# Patient Record
Sex: Female | Born: 1940 | Race: White | Hispanic: No | Marital: Married | Smoking: Never smoker
Health system: Southern US, Community
[De-identification: ages and names within clinical notes are randomized; demographics above are authoritative.]

## PROBLEM LIST (undated history)

## (undated) DIAGNOSIS — Z789 Other specified health status: Secondary | ICD-10-CM

## (undated) HISTORY — PX: EYE SURGERY: SHX253

## (undated) HISTORY — PX: TUBAL LIGATION: SHX77

---

## 1971-06-16 DIAGNOSIS — N8110 Cystocele, unspecified: Secondary | ICD-10-CM

## 1971-06-16 HISTORY — DX: Cystocele, unspecified: N81.10

## 2007-01-12 ENCOUNTER — Emergency Department (HOSPITAL_COMMUNITY): Admission: EM | Admit: 2007-01-12 | Discharge: 2007-01-13 | Payer: Self-pay | Admitting: Emergency Medicine

## 2015-07-30 ENCOUNTER — Ambulatory Visit
Admission: RE | Admit: 2015-07-30 | Discharge: 2015-07-30 | Disposition: A | Discharge status: Home / Self Care | Payer: Medicare Other | Source: Ambulatory Visit | Attending: Orthopedic Surgery | Admitting: Orthopedic Surgery

## 2015-07-30 ENCOUNTER — Other Ambulatory Visit: Payer: Self-pay | Admitting: Orthopedic Surgery

## 2015-07-30 ENCOUNTER — Other Ambulatory Visit (HOSPITAL_COMMUNITY): Payer: Self-pay

## 2015-07-30 DIAGNOSIS — S42402A Unspecified fracture of lower end of left humerus, initial encounter for closed fracture: Secondary | ICD-10-CM

## 2015-08-01 ENCOUNTER — Encounter (HOSPITAL_COMMUNITY): Payer: Self-pay

## 2015-08-01 ENCOUNTER — Encounter (HOSPITAL_COMMUNITY)
Admission: RE | Admit: 2015-08-01 | Discharge: 2015-08-01 | Disposition: A | Discharge status: Home / Self Care | Payer: Medicare Other | Source: Ambulatory Visit | Attending: Orthopedic Surgery | Admitting: Orthopedic Surgery

## 2015-08-01 HISTORY — DX: Other specified health status: Z78.9

## 2015-08-01 LAB — CBC WITH DIFFERENTIAL/PLATELET
Basophils Absolute: 0 10*3/uL (ref 0.0–0.1)
Basophils Relative: 0 %
Eosinophils Absolute: 0.1 10*3/uL (ref 0.0–0.7)
Eosinophils Relative: 1 %
HCT: 34 % — ABNORMAL LOW (ref 36.0–46.0)
Hemoglobin: 10.8 g/dL — ABNORMAL LOW (ref 12.0–15.0)
Lymphocytes Relative: 29 %
Lymphs Abs: 2.4 10*3/uL (ref 0.7–4.0)
MCH: 29.5 pg (ref 26.0–34.0)
MCHC: 31.8 g/dL (ref 30.0–36.0)
MCV: 92.9 fL (ref 78.0–100.0)
Monocytes Absolute: 0.8 10*3/uL (ref 0.1–1.0)
Monocytes Relative: 9 %
Neutro Abs: 5.2 10*3/uL (ref 1.7–7.7)
Neutrophils Relative %: 61 %
Platelets: 255 10*3/uL (ref 150–400)
RBC: 3.66 MIL/uL — ABNORMAL LOW (ref 3.87–5.11)
RDW: 13.1 % (ref 11.5–15.5)
WBC: 8.5 10*3/uL (ref 4.0–10.5)

## 2015-08-01 LAB — ABO/RH: ABO/RH(D): O POS

## 2015-08-01 LAB — COMPREHENSIVE METABOLIC PANEL
ALT: 13 U/L — ABNORMAL LOW (ref 14–54)
AST: 26 U/L (ref 15–41)
Albumin: 3.4 g/dL — ABNORMAL LOW (ref 3.5–5.0)
Alkaline Phosphatase: 91 U/L (ref 38–126)
Anion gap: 12 (ref 5–15)
BUN: 6 mg/dL (ref 6–20)
CO2: 28 mmol/L (ref 22–32)
Calcium: 9.6 mg/dL (ref 8.9–10.3)
Chloride: 100 mmol/L — ABNORMAL LOW (ref 101–111)
Creatinine, Ser: 0.94 mg/dL (ref 0.44–1.00)
GFR calc Af Amer: >60 mL/min (ref >60)
GFR calc non Af Amer: 58 mL/min — ABNORMAL LOW (ref >60)
Glucose, Bld: 90 mg/dL (ref 65–99)
Potassium: 3.3 mmol/L — ABNORMAL LOW (ref 3.5–5.1)
Sodium: 140 mmol/L (ref 135–145)
Total Bilirubin: 0.6 mg/dL (ref 0.3–1.2)
Total Protein: 7 g/dL (ref 6.5–8.1)

## 2015-08-01 LAB — URINALYSIS, ROUTINE W REFLEX MICROSCOPIC
Bilirubin Urine: NEGATIVE
Glucose, UA: NEGATIVE mg/dL
Ketones, ur: NEGATIVE mg/dL
Nitrite: NEGATIVE
Protein, ur: NEGATIVE mg/dL
Specific Gravity, Urine: 1.007 (ref 1.005–1.030)
pH: 5 (ref 5.0–8.0)

## 2015-08-01 LAB — TYPE AND SCREEN
ABO/RH(D): O POS
Antibody Screen: NEGATIVE

## 2015-08-01 LAB — URINE MICROSCOPIC-ADD ON

## 2015-08-01 LAB — APTT: aPTT: 31 seconds (ref 24–37)

## 2015-08-01 LAB — PROTIME-INR
INR: 1.05 (ref 0.00–1.49)
Prothrombin Time: 13.9 seconds (ref 11.6–15.2)

## 2015-08-01 MED ORDER — LACTATED RINGERS IV SOLN
INTRAVENOUS | Status: DC
  Administered 2015-08-02 (×3): via INTRAVENOUS

## 2015-08-01 MED ORDER — CEFAZOLIN SODIUM-DEXTROSE 2-3 GM-% IV SOLR
2.0000 g | INTRAVENOUS | Status: AC
  Administered 2015-08-02: 2 g via INTRAVENOUS
  Filled 2015-08-01: qty 50

## 2015-08-01 MED ORDER — CHLORHEXIDINE GLUCONATE 4 % EX LIQD: 60.0000 mL | Freq: Once | CUTANEOUS | Status: DC

## 2015-08-01 NOTE — Pre-Procedure Instructions (Signed)
Erica Mora  08/01/2015      [facility] - [place], [place] - [id number] [place] [address] Phone: [phone] Fax: [phone]    Your procedure is scheduled on 08/02/2015  Report to [facility] Admitting at 6:00 A.M.  Call this number if you have problems the morning of surgery:  [phone]   Remember:  Do not eat food or drink liquids after midnight.  On Thursday   Take these medicines the morning of surgery with A SIP OF WATER : Pain medicine is OK the morning ..........  No more advil   Do not wear jewelry, make-up or nail polish.   Do not wear lotions, powders, or perfumes.  You may wear deodorant.   Do not shave 48 hours prior to surgery.    Do not bring valuables to the hospital.  [facility] is not responsible for any belongings or valuables.   Contacts, dentures or bridgework may not be worn into surgery.  Leave your suitcase in the car.  After surgery it may be brought to your room.  For patients admitted to the hospital, discharge time will be determined by your treatment team.  Patients discharged the day of surgery will not be allowed to drive home.   Name and phone number of your driver:   With spouse  Special instructions:  Special Instructions: [facility] - Preparing for Surgery  Before surgery, you can play an important role.  Because skin is not sterile, your skin needs to be as free of germs as possible.  You can reduce the number of germs on you skin by washing with CHG (chlorahexidine gluconate) soap before surgery.  CHG is an antiseptic cleaner which kills germs and bonds with the skin to continue killing germs even after washing.  Please DO NOT use if you have an allergy to CHG or antibacterial soaps.  If your skin becomes reddened/irritated stop using the CHG and inform your nurse when you arrive at Short Stay.  Do not shave (including legs and underarms) for at least 48 hours prior to  the first CHG shower.  You may shave your face.  Please follow these instructions carefully:   1.  Shower with CHG Soap the night before surgery and the  morning of Surgery.  2.  If you choose to wash your hair, wash your hair first as usual with your  normal shampoo.  3.  After you shampoo, rinse your hair and body thoroughly to remove the  Shampoo.  4.  Use CHG as you would any other liquid soap.  You can apply chg directly to the skin and wash gently with scrungie or a clean washcloth.  5.  Apply the CHG Soap to your body ONLY FROM THE NECK DOWN.    Do not use on open wounds or open sores.  Avoid contact with your eyes, ears, mouth and genitals (private parts).  Wash genitals (private parts)   with your normal soap.  6.  Wash thoroughly, paying special attention to the area where your surgery will be performed.  7.  Thoroughly rinse your body with warm water from the neck down.  8.  DO NOT shower/wash with your normal soap after using and rinsing off   the CHG Soap.  9.  Pat yourself dry with a clean towel.            10.  Wear clean pajamas.  11.  Place clean sheets on your bed the night of your first shower and do not sleep with pets.  Day of Surgery  Do not apply any lotions/deodorants the morning of surgery.  Please wear clean clothes to the hospital/surgery center.  Please read over the following fact sheets that you were given. Pain Booklet, Coughing and Deep Breathing and Surgical Site Infection Prevention

## 2015-08-01 NOTE — Progress Notes (Addendum)
Pt. Reports " I don't see doctors & I don't take medicines".  Pt. Also remarked that she will not have a tube put down her throat.  Pt. Reports that she saw Dr. Susann Givens for PCP- - last time 5 yrs. Ago. Pt. Denies ever having an EKG, CXR, or any advanced cardiac surveillance. Pt. Denies chest concerns.

## 2015-08-01 NOTE — Progress Notes (Signed)
Call to A. Kabbe,NP, reported lack of med. Care & that she takes very little medicine. Lab results noted & reported as well. Pt.'s chart will be further reviewed.

## 2015-08-01 NOTE — Progress Notes (Signed)
Call to Dr. Carlean Jews office, spoke with Rebekah Chesterfield, she will pass msg. On to Keith,PA to put preop orders into EPIC chart.

## 2015-08-02 ENCOUNTER — Encounter (HOSPITAL_COMMUNITY): Payer: Self-pay | Admitting: *Deleted

## 2015-08-02 ENCOUNTER — Inpatient Hospital Stay (HOSPITAL_COMMUNITY): Payer: Medicare Other

## 2015-08-02 ENCOUNTER — Inpatient Hospital Stay (HOSPITAL_COMMUNITY): Payer: Medicare Other | Admitting: Anesthesiology

## 2015-08-02 ENCOUNTER — Inpatient Hospital Stay (HOSPITAL_COMMUNITY): Payer: Medicare Other | Admitting: Emergency Medicine

## 2015-08-02 ENCOUNTER — Inpatient Hospital Stay (HOSPITAL_COMMUNITY)
Admission: RE | Admit: 2015-08-02 | Discharge: 2015-08-02 | DRG: 493 | Disposition: A | Discharge status: Home / Self Care | Payer: Medicare Other | Source: Ambulatory Visit | Attending: Orthopedic Surgery | Admitting: Orthopedic Surgery

## 2015-08-02 ENCOUNTER — Encounter (HOSPITAL_COMMUNITY)
Admission: RE | Disposition: A | Discharge status: Home / Self Care | Payer: Self-pay | Source: Ambulatory Visit | Attending: Orthopedic Surgery

## 2015-08-02 DIAGNOSIS — W010XXA Fall on same level from slipping, tripping and stumbling without subsequent striking against object, initial encounter: Secondary | ICD-10-CM | POA: Diagnosis present

## 2015-08-02 DIAGNOSIS — S42409A Unspecified fracture of lower end of unspecified humerus, initial encounter for closed fracture: Secondary | ICD-10-CM | POA: Diagnosis present

## 2015-08-02 DIAGNOSIS — S42412A Displaced simple supracondylar fracture without intercondylar fracture of left humerus, initial encounter for closed fracture: Principal | ICD-10-CM | POA: Diagnosis present

## 2015-08-02 DIAGNOSIS — M25532 Pain in left wrist: Secondary | ICD-10-CM | POA: Diagnosis present

## 2015-08-02 DIAGNOSIS — S52592A Other fractures of lower end of left radius, initial encounter for closed fracture: Secondary | ICD-10-CM | POA: Diagnosis present

## 2015-08-02 DIAGNOSIS — Y9289 Other specified places as the place of occurrence of the external cause: Secondary | ICD-10-CM

## 2015-08-02 DIAGNOSIS — Y9341 Activity, dancing: Secondary | ICD-10-CM | POA: Diagnosis not present

## 2015-08-02 DIAGNOSIS — Z419 Encounter for procedure for purposes other than remedying health state, unspecified: Secondary | ICD-10-CM

## 2015-08-02 DIAGNOSIS — S52502A Unspecified fracture of the lower end of left radius, initial encounter for closed fracture: Secondary | ICD-10-CM

## 2015-08-02 DIAGNOSIS — S42402A Unspecified fracture of lower end of left humerus, initial encounter for closed fracture: Secondary | ICD-10-CM

## 2015-08-02 HISTORY — PX: ORIF HUMERUS FRACTURE: SHX2126

## 2015-08-02 SURGERY — OPEN REDUCTION INTERNAL FIXATION (ORIF) DISTAL HUMERUS FRACTURE
Anesthesia: Regional | Laterality: Left

## 2015-08-02 MED ORDER — EPHEDRINE SULFATE 50 MG/ML IJ SOLN
INTRAMUSCULAR | Status: AC
  Filled 2015-08-02: qty 1

## 2015-08-02 MED ORDER — DEXTROSE 5 % IV SOLN: 500.0000 mg | Freq: Four times a day (QID) | INTRAVENOUS | Status: DC

## 2015-08-02 MED ORDER — FENTANYL CITRATE (PF) 250 MCG/5ML IJ SOLN
INTRAMUSCULAR | Status: AC
  Filled 2015-08-02: qty 5

## 2015-08-02 MED ORDER — LIDOCAINE HCL 2 % EX GEL
CUTANEOUS | Status: AC
  Filled 2015-08-02: qty 20

## 2015-08-02 MED ORDER — ROCURONIUM BROMIDE 50 MG/5ML IV SOLN
INTRAVENOUS | Status: AC
  Filled 2015-08-02: qty 1

## 2015-08-02 MED ORDER — ONDANSETRON HCL 4 MG/2ML IJ SOLN
INTRAMUSCULAR | Status: AC
  Filled 2015-08-02: qty 2

## 2015-08-02 MED ORDER — HYDROCODONE-ACETAMINOPHEN 5-325 MG PO TABS: 1.0000 | ORAL_TABLET | Freq: Four times a day (QID) | ORAL | Status: DC | PRN

## 2015-08-02 MED ORDER — MIDAZOLAM HCL 2 MG/2ML IJ SOLN
INTRAMUSCULAR | Status: AC
  Filled 2015-08-02: qty 2

## 2015-08-02 MED ORDER — BUPIVACAINE-EPINEPHRINE (PF) 0.5% -1:200000 IJ SOLN
INTRAMUSCULAR | Status: DC | PRN
  Administered 2015-08-02: 25 mL via PERINEURAL

## 2015-08-02 MED ORDER — SUCCINYLCHOLINE CHLORIDE 20 MG/ML IJ SOLN
INTRAMUSCULAR | Status: AC
  Filled 2015-08-02: qty 1

## 2015-08-02 MED ORDER — ONDANSETRON HCL 4 MG/2ML IJ SOLN
INTRAMUSCULAR | Status: DC | PRN
  Administered 2015-08-02: 4 mg via INTRAVENOUS

## 2015-08-02 MED ORDER — DEXAMETHASONE SODIUM PHOSPHATE 4 MG/ML IJ SOLN
INTRAMUSCULAR | Status: AC
  Filled 2015-08-02: qty 1

## 2015-08-02 MED ORDER — LACTATED RINGERS IV SOLN: INTRAVENOUS | Status: DC

## 2015-08-02 MED ORDER — OXYCODONE HCL 5 MG PO TABS: 5.0000 mg | ORAL_TABLET | ORAL | Status: DC | PRN

## 2015-08-02 MED ORDER — LIDOCAINE HCL (CARDIAC) 20 MG/ML IV SOLN
INTRAVENOUS | Status: DC | PRN
  Administered 2015-08-02: 40 mg via INTRAVENOUS

## 2015-08-02 MED ORDER — PHENYLEPHRINE 40 MCG/ML (10ML) SYRINGE FOR IV PUSH (FOR BLOOD PRESSURE SUPPORT)
PREFILLED_SYRINGE | INTRAVENOUS | Status: AC
  Filled 2015-08-02: qty 10

## 2015-08-02 MED ORDER — MIDAZOLAM HCL 5 MG/5ML IJ SOLN
INTRAMUSCULAR | Status: DC | PRN
  Administered 2015-08-02: 2 mg via INTRAVENOUS

## 2015-08-02 MED ORDER — METHOCARBAMOL 500 MG PO TABS: 1000.0000 mg | ORAL_TABLET | Freq: Four times a day (QID) | ORAL | Status: DC

## 2015-08-02 MED ORDER — VECURONIUM BROMIDE 10 MG IV SOLR
INTRAVENOUS | Status: DC | PRN
  Administered 2015-08-02 (×2): 2 mg via INTRAVENOUS
  Administered 2015-08-02: 6 mg via INTRAVENOUS

## 2015-08-02 MED ORDER — FENTANYL CITRATE (PF) 100 MCG/2ML IJ SOLN
INTRAMUSCULAR | Status: DC | PRN
  Administered 2015-08-02 (×2): 50 ug via INTRAVENOUS

## 2015-08-02 MED ORDER — LIDOCAINE HCL (CARDIAC) 20 MG/ML IV SOLN
INTRAVENOUS | Status: AC
  Filled 2015-08-02: qty 5

## 2015-08-02 MED ORDER — GLYCOPYRROLATE 0.2 MG/ML IJ SOLN
INTRAMUSCULAR | Status: AC
  Filled 2015-08-02: qty 1

## 2015-08-02 MED ORDER — SODIUM CHLORIDE 0.9 % IJ SOLN
INTRAMUSCULAR | Status: AC
  Filled 2015-08-02: qty 10

## 2015-08-02 MED ORDER — BUPIVACAINE HCL (PF) 0.25 % IJ SOLN
INTRAMUSCULAR | Status: AC
  Filled 2015-08-02: qty 30

## 2015-08-02 MED ORDER — PROPOFOL 10 MG/ML IV BOLUS
INTRAVENOUS | Status: DC | PRN
  Administered 2015-08-02: 100 mg via INTRAVENOUS

## 2015-08-02 MED ORDER — PHENYLEPHRINE HCL 10 MG/ML IJ SOLN
INTRAMUSCULAR | Status: DC | PRN
  Administered 2015-08-02 (×3): 40 ug via INTRAVENOUS

## 2015-08-02 MED ORDER — DEXAMETHASONE SODIUM PHOSPHATE 4 MG/ML IJ SOLN
INTRAMUSCULAR | Status: DC | PRN
  Administered 2015-08-02: 4 mg via INTRAVENOUS
  Administered 2015-08-02: 2 mg via INTRAVENOUS

## 2015-08-02 MED ORDER — PROPOFOL 10 MG/ML IV BOLUS
INTRAVENOUS | Status: AC
  Filled 2015-08-02: qty 20

## 2015-08-02 MED ORDER — 0.9 % SODIUM CHLORIDE (POUR BTL) OPTIME
TOPICAL | Status: DC | PRN
  Administered 2015-08-02: 1000 mL

## 2015-08-02 MED ORDER — SUGAMMADEX SODIUM 200 MG/2ML IV SOLN
INTRAVENOUS | Status: DC | PRN
  Administered 2015-08-02: 100 mg via INTRAVENOUS

## 2015-08-02 SURGICAL SUPPLY — 104 items
BANDAGE ACE 4X5 VEL STRL LF (GAUZE/BANDAGES/DRESSINGS) ×4 IMPLANT
BENZOIN TINCTURE PRP APPL 2/3 (GAUZE/BANDAGES/DRESSINGS) ×4 IMPLANT
BIT DRILL 2.2 SS TIBIAL (BIT) ×2 IMPLANT
BIT DRILL 2.3 QUICK RELEASE (BIT) ×1 IMPLANT
BIT DRILL 2.8 QUICK RELEASE (BIT) ×1 IMPLANT
BLADE AVERAGE 25X9 (BLADE) IMPLANT
BNDG ESMARK 4X9 LF (GAUZE/BANDAGES/DRESSINGS) ×2 IMPLANT
BNDG GAUZE ELAST 4 BULKY (GAUZE/BANDAGES/DRESSINGS) ×4 IMPLANT
BRUSH SCRUB DISP (MISCELLANEOUS) ×4 IMPLANT
CORDS BIPOLAR (ELECTRODE) ×4 IMPLANT
COVER SURGICAL LIGHT HANDLE (MISCELLANEOUS) ×4 IMPLANT
CUFF TOURNIQUET SINGLE 18IN (TOURNIQUET CUFF) ×4 IMPLANT
DRAIN PENROSE 1/4X12 LTX STRL (WOUND CARE) ×2 IMPLANT
DRAPE C-ARM 42X72 X-RAY (DRAPES) ×2 IMPLANT
DRAPE C-ARMOR (DRAPES) ×2 IMPLANT
DRAPE EXTREMITY T 121X128X90 (DRAPE) ×2 IMPLANT
DRAPE IMP U-DRAPE 54X76 (DRAPES) ×2 IMPLANT
DRAPE INCISE IOBAN 66X45 STRL (DRAPES) IMPLANT
DRAPE SURG 17X11 SM STRL (DRAPES) ×2 IMPLANT
DRAPE U-SHAPE 47X51 STRL (DRAPES) IMPLANT
DRILL 2.3 QUICK RELEASE (BIT) ×2
DRILL 2.8 QUICK RELEASE (BIT) ×2
DRSG ADAPTIC 3X8 NADH LF (GAUZE/BANDAGES/DRESSINGS) ×2 IMPLANT
DRSG MEPITEL 4X7.2 (GAUZE/BANDAGES/DRESSINGS) ×2 IMPLANT
DRSG PAD ABDOMINAL 8X10 ST (GAUZE/BANDAGES/DRESSINGS) ×4 IMPLANT
ELECT REM PT RETURN 9FT ADLT (ELECTROSURGICAL) ×2
ELECTRODE REM PT RTRN 9FT ADLT (ELECTROSURGICAL) ×1 IMPLANT
EVACUATOR 1/8 PVC DRAIN (DRAIN) IMPLANT
GAUZE SPONGE 4X4 12PLY STRL (GAUZE/BANDAGES/DRESSINGS) ×4 IMPLANT
GLOVE BIO SURGEON STRL SZ7.5 (GLOVE) IMPLANT
GLOVE BIOGEL PI IND STRL 7.5 (GLOVE) ×1 IMPLANT
GLOVE BIOGEL PI IND STRL 8 (GLOVE) ×1 IMPLANT
GLOVE BIOGEL PI INDICATOR 7.5 (GLOVE) ×1
GLOVE BIOGEL PI INDICATOR 8 (GLOVE) ×1
GLOVE SURG SYN 7.5  E (GLOVE) ×4
GLOVE SURG SYN 7.5 E (GLOVE) ×4 IMPLANT
GOWN STRL REUS W/ TWL LRG LVL3 (GOWN DISPOSABLE) ×2 IMPLANT
GOWN STRL REUS W/ TWL XL LVL3 (GOWN DISPOSABLE) ×1 IMPLANT
GOWN STRL REUS W/TWL LRG LVL3 (GOWN DISPOSABLE) ×2
GOWN STRL REUS W/TWL XL LVL3 (GOWN DISPOSABLE) ×1
GUIDEWIRE ORTH 6X062XTROC NS (WIRE) ×3 IMPLANT
K-WIRE .062 (WIRE) ×3
K-WIRE 1.6 (WIRE) ×3
K-WIRE FX5X1.6XNS BN SS (WIRE) ×3
KIT BASIN OR (CUSTOM PROCEDURE TRAY) ×2 IMPLANT
KIT INFUSE SMALL (Orthopedic Implant) ×2 IMPLANT
KIT ROOM TURNOVER OR (KITS) ×2 IMPLANT
KWIRE FX5X1.6XNS BN SS (WIRE) ×3 IMPLANT
MANIFOLD NEPTUNE II (INSTRUMENTS) ×2 IMPLANT
NEEDLE HYPO 25X1 1.5 SAFETY (NEEDLE) ×2 IMPLANT
NS IRRIG 1000ML POUR BTL (IV SOLUTION) ×2 IMPLANT
PACK TOTAL JOINT (CUSTOM PROCEDURE TRAY) ×2 IMPLANT
PACK UNIVERSAL I (CUSTOM PROCEDURE TRAY) ×2 IMPLANT
PAD ARMBOARD 7.5X6 YLW CONV (MISCELLANEOUS) ×4 IMPLANT
PAD CAST 4YDX4 CTTN HI CHSV (CAST SUPPLIES) ×2 IMPLANT
PADDING CAST COTTON 4X4 STRL (CAST SUPPLIES) ×2
PEG LOCKING SMOOTH 2.2X16 (Screw) ×4 IMPLANT
PEG LOCKING SMOOTH 2.2X20 (Screw) ×8 IMPLANT
PLATE BN MN NAR 41X22 CRSLCK (Plate) ×1 IMPLANT
PLATE CROSSLOCK NAR MINI LT (Plate) ×1 IMPLANT
PLATE LATERAL LOCK 6 HOLE (Plate) ×2 IMPLANT
PLATE MEDIAL 8 HOLE (Plate) ×2 IMPLANT
PLATE TAP 3.0 MM ×2 IMPLANT
PLATE TAP FOR 3.5 SCREW (TAP) ×2 IMPLANT
SCREW 2.7X12MM (Screw) ×4 IMPLANT
SCREW CORTICAL 3.5X20MM (Screw) ×2 IMPLANT
SCREW HEX LOCK 3.0X22MM (Screw) ×2 IMPLANT
SCREW HEX NON LOCK 3.5X26MM (Screw) ×4 IMPLANT
SCREW HEXALOBE LOCK 3.5X22MM (Screw) ×2 IMPLANT
SCREW HEXALOBE LOCK 3.5X50MM (Screw) ×2 IMPLANT
SCREW LOCK 12X2.7X 3 LD (Screw) ×2 IMPLANT
SCREW LOCK 36X3.5X HEXALOBE (Screw) ×1 IMPLANT
SCREW LOCKING 2.7X12MM (Screw) ×2 IMPLANT
SCREW LOCKING 3.0X28MM (Screw) ×2 IMPLANT
SCREW LOCKING 3.0X34MM (Screw) ×2 IMPLANT
SCREW LOCKING 3.0X40 (Screw) ×2 IMPLANT
SCREW LOCKING 3.5X36 (Screw) ×1 IMPLANT
SCREW LOCKING HEX 3.5X26MM (Screw) ×2 IMPLANT
SCREW NONLOCKING 3.5X28MM (Screw) ×2 IMPLANT
SPLINT PLASTER CAST XFAST 4X15 (CAST SUPPLIES) ×1 IMPLANT
SPLINT PLASTER XTRA FAST SET 4 (CAST SUPPLIES) ×1
SPONGE LAP 18X18 X RAY DECT (DISPOSABLE) IMPLANT
SPONGE LAP 4X18 X RAY DECT (DISPOSABLE) ×2 IMPLANT
STAPLER VISISTAT 35W (STAPLE) ×2 IMPLANT
STOCKINETTE IMPERVIOUS 9X36 MD (GAUZE/BANDAGES/DRESSINGS) IMPLANT
SUCTION FRAZIER HANDLE 10FR (MISCELLANEOUS) ×1
SUCTION TUBE FRAZIER 10FR DISP (MISCELLANEOUS) ×1 IMPLANT
SUT ETHIBOND 5 LR DA (SUTURE) ×2 IMPLANT
SUT ETHILON 3 0 PS 1 (SUTURE) ×8 IMPLANT
SUT FIBERWIRE #2 38 REV NDL BL (SUTURE) ×2
SUT PROLENE 2 0 CT 1 (SUTURE) ×4 IMPLANT
SUT VIC AB 0 CT1 27 (SUTURE) ×2
SUT VIC AB 0 CT1 27XBRD ANBCTR (SUTURE) ×2 IMPLANT
SUT VIC AB 2-0 CT1 27 (SUTURE) ×3
SUT VIC AB 2-0 CT1 TAPERPNT 27 (SUTURE) ×3 IMPLANT
SUTURE FIBERWR#2 38 REV NDL BL (SUTURE) ×1 IMPLANT
SYR 5ML LL (SYRINGE) IMPLANT
SYR CONTROL 10ML LL (SYRINGE) ×2 IMPLANT
TAP SURG SCREW 3 F/3 PLATE (Plate) ×2 IMPLANT
TOWEL OR 17X24 6PK STRL BLUE (TOWEL DISPOSABLE) ×2 IMPLANT
TOWEL OR 17X26 10 PK STRL BLUE (TOWEL DISPOSABLE) ×6 IMPLANT
TRAY FOLEY CATH 16FRSI W/METER (SET/KITS/TRAYS/PACK) ×2 IMPLANT
WATER STERILE IRR 1000ML POUR (IV SOLUTION) ×2 IMPLANT
YANKAUER SUCT BULB TIP NO VENT (SUCTIONS) ×2 IMPLANT

## 2015-08-02 NOTE — H&P (Signed)
[facility] H&P/Consult     Chief Complaint: Left supracondylar humerus and distal radius fractures. HPI: Erica Mora is an 75 y.o. female.fell shagging with above injuries.   Past Medical History  Diagnosis Date  . Medical history non-contributory     Past Surgical History  Procedure Laterality Date  . Vaginal delivery    . Tubal ligation    . Eye surgery      cataracts- bilateral      History reviewed. No pertinent family history. Social History:  reports that she has never smoked. She does not have any smokeless tobacco history on file. She reports that she drinks alcohol. She reports that she does not use illicit drugs.  Allergies: No Known Allergies  Medications Prior to Admission  Medication Sig Dispense Refill  . anti-nausea (EMETROL) solution Take 10 mLs by mouth every 15 (fifteen) minutes as needed for nausea or vomiting.    . calcium carbonate (TUMS EX) 750 MG chewable tablet Chew 1 tablet by mouth daily as needed for heartburn.    Marland Kitchen HYDROcodone-acetaminophen (NORCO/VICODIN) 5-325 MG tablet Take 1 tablet by mouth every 6 (six) hours as needed for moderate pain.    Marland Kitchen ibuprofen (ADVIL,MOTRIN) 200 MG tablet Take 200 mg by mouth every 6 (six) hours as needed for moderate pain.    . Multiple Vitamins-Minerals (MULTIVITAMIN WITH MINERALS) tablet Take 1 tablet by mouth daily.      Results for orders placed or performed during the hospital encounter of 08/01/15 (from the past 48 hour(s))  Urinalysis, Routine w reflex microscopic (not at [facility])     Status: Abnormal   Collection Time: 08/01/15 12:44 PM  Result Value Ref Range   Color, Urine YELLOW YELLOW   APPearance CLOUDY (A) CLEAR   Specific Gravity, Urine 1.007 1.005 - 1.030   pH 5.0 5.0 - 8.0   Glucose, UA NEGATIVE NEGATIVE mg/dL   Hgb urine dipstick MODERATE (A) NEGATIVE   Bilirubin Urine NEGATIVE NEGATIVE   Ketones, ur NEGATIVE NEGATIVE mg/dL   Protein, ur NEGATIVE NEGATIVE mg/dL   Nitrite  NEGATIVE NEGATIVE   Leukocytes, UA LARGE (A) NEGATIVE  Urine microscopic-add on     Status: Abnormal   Collection Time: 08/01/15 12:44 PM  Result Value Ref Range   Squamous Epithelial / LPF 0-5 (A) NONE SEEN   WBC, UA 6-30 0 - 5 WBC/hpf   RBC / HPF 0-5 0 - 5 RBC/hpf   Bacteria, UA FEW (A) NONE SEEN   Casts HYALINE CASTS (A) NEGATIVE   Urine-Other LESS THAN 10 mL OF URINE SUBMITTED     Comment: MICROSCOPIC EXAM PERFORMED ON UNCONCENTRATED URINE  APTT     Status: None   Collection Time: 08/01/15 12:45 PM  Result Value Ref Range   aPTT 31 24 - 37 seconds  CBC WITH DIFFERENTIAL     Status: Abnormal   Collection Time: 08/01/15 12:45 PM  Result Value Ref Range   WBC 8.5 4.0 - 10.5 K/uL   RBC 3.66 (L) 3.87 - 5.11 MIL/uL   Hemoglobin 10.8 (L) 12.0 - 15.0 g/dL   HCT 34.0 (L) 36.0 - 46.0 %   MCV 92.9 78.0 - 100.0 fL   MCH 29.5 26.0 - 34.0 pg   MCHC 31.8 30.0 - 36.0 g/dL   RDW 13.1 11.5 - 15.5 %   Platelets 255 150 - 400 K/uL   Neutrophils Relative % 61 %   Neutro Abs 5.2 1.7 - 7.7 K/uL   Lymphocytes Relative 29 %  Lymphs Abs 2.4 0.7 - 4.0 K/uL   Monocytes Relative 9 %   Monocytes Absolute 0.8 0.1 - 1.0 K/uL   Eosinophils Relative 1 %   Eosinophils Absolute 0.1 0.0 - 0.7 K/uL   Basophils Relative 0 %   Basophils Absolute 0.0 0.0 - 0.1 K/uL  Comprehensive metabolic panel     Status: Abnormal   Collection Time: 08/01/15 12:45 PM  Result Value Ref Range   Sodium 140 135 - 145 mmol/L   Potassium 3.3 (L) 3.5 - 5.1 mmol/L   Chloride 100 (L) 101 - 111 mmol/L   CO2 28 22 - 32 mmol/L   Glucose, Bld 90 65 - 99 mg/dL   BUN 6 6 - 20 mg/dL   Creatinine, Ser 0.94 0.44 - 1.00 mg/dL   Calcium 9.6 8.9 - 10.3 mg/dL   Total Protein 7.0 6.5 - 8.1 g/dL   Albumin 3.4 (L) 3.5 - 5.0 g/dL   AST 26 15 - 41 U/L   ALT 13 (L) 14 - 54 U/L   Alkaline Phosphatase 91 38 - 126 U/L   Total Bilirubin 0.6 0.3 - 1.2 mg/dL   GFR calc non Af Amer 58 (L) >60 mL/min   GFR calc Af Amer >60 >60 mL/min    Comment:  (NOTE) The eGFR has been calculated using the CKD EPI equation. This calculation has not been validated in all clinical situations. eGFR's persistently <60 mL/min signify possible Chronic Kidney Disease.    Anion gap 12 5 - 15  Protime-INR     Status: None   Collection Time: 08/01/15 12:45 PM  Result Value Ref Range   Prothrombin Time 13.9 11.6 - 15.2 seconds   INR 1.05 0.00 - 1.49  Type and screen Order type and screen if day of surgery is less than 15 days from draw of preadmission visit or order morning of surgery if day of surgery is greater than 6 days from preadmission visit.     Status: None   Collection Time: 08/01/15 12:45 PM  Result Value Ref Range   ABO/RH(D) O POS    Antibody Screen NEG    Sample Expiration 08/04/2015   ABO/Rh     Status: None   Collection Time: 08/01/15 12:45 PM  Result Value Ref Range   ABO/RH(D) O POS    No results found.  ROS No recent fever, bleeding abnormalities, urologic dysfunction, GI problems, or weight gain.  Blood pressure 173/78, pulse 73, temperature 97.9 F (36.6 C), temperature source Oral, resp. rate 16, height 5' (1.524 m), weight 111 lb (50.349 kg), SpO2 100 %. Physical Exam CTA RRR LUEx splint in place  Sens  Ax/R/M/U intact  Mot   Ax/ R/ PIN/ M/ AIN/ U appear intact but splint so restrictive unable to clearly determine  Rad 2+    Assessment/Plan Left supracondylar humerus and distal radius fractures.  I discussed with the patient the risks and benefits of surgery, including the possibility of infection, nerve injury, vessel injury, wound breakdown, arthritis, symptomatic hardware, DVT/ PE, loss of motion, and need for further surgery among others.  She understood these risks and wished to proceed.   Altamese Perry, MD [facility] [phone] (p)   08/02/2015, 7:54 AM

## 2015-08-02 NOTE — Anesthesia Procedure Notes (Addendum)
Anesthesia Regional Block:  Supraclavicular block  Pre-Anesthetic Checklist: ,, timeout performed, Correct Patient, Correct Site, Correct Laterality, Correct Procedure, Correct Position, site marked, Risks and benefits discussed,  Surgical consent,  Pre-op evaluation,  At surgeon's request and post-op pain management  Laterality: Upper and Left  Prep: chloraprep       Needles:  Injection technique: Single-shot  Needle Type: Echogenic Needle          Additional Needles:  Procedures: ultrasound guided (picture in chart) Supraclavicular block Narrative:  Injection made incrementally with aspirations every 5 mL.  Performed by: Personally   Additional Notes: H+P and labs reviewed, risks and benefits discussed with patient, procedure tolerated well without complications   Procedure Name: Intubation Date/Time: 08/02/2015 8:22 AM Performed by: Luciana Axe K Pre-anesthesia Checklist: Patient identified, Emergency Drugs available, Suction available, Patient being monitored and Timeout performed Patient Re-evaluated:Patient Re-evaluated prior to inductionOxygen Delivery Method: Circle system utilized Preoxygenation: Pre-oxygenation with 100% oxygen Intubation Type: IV induction Ventilation: Mask ventilation without difficulty Laryngoscope Size: Miller and 2 Grade View: Grade II Tube type: Oral Tube size: 7.0 mm Number of attempts: 1 Airway Equipment and Method: Stylet Placement Confirmation: ETT inserted through vocal cords under direct vision,  positive ETCO2,  breath sounds checked- equal and bilateral and CO2 detector Secured at: 20 cm Tube secured with: Tape Dental Injury: Teeth and Oropharynx as per pre-operative assessment

## 2015-08-02 NOTE — Transfer of Care (Signed)
Immediate Anesthesia Transfer of Care Note  Patient: Erica Mora  Procedure(s) Performed: Procedure(s): OPEN REDUCTION INTERNAL FIXATION (ORIF)LEFT DISTAL RADIUS,HUMERUS FRACTURE (Left)  Patient Location: PACU  Anesthesia Type:General  Level of Consciousness: awake, oriented and patient cooperative  Airway & Oxygen Therapy: Patient Spontanous Breathing and Patient connected to nasal cannula oxygen  Post-op Assessment: Report given to RN and Post -op Vital signs reviewed and stable  Post vital signs: Reviewed  Last Vitals:  Filed Vitals:   08/02/15 0653  BP: 173/78  Pulse: 73  Temp: 36.6 C  Resp: 16    Complications: No apparent anesthesia complications

## 2015-08-02 NOTE — Discharge Instructions (Signed)
Orthopaedic Trauma Service Discharge Instructions   General Discharge Instructions  WEIGHT BEARING STATUS: Nonweightbearing Left upper extremity   RANGE OF MOTION/ACTIVITY: no range of motion left upper extremity. Sling for comfort. Ok to move fingers   Wound Care: do no remove splint. We will remove at follow up.  PAIN MEDICATION USE AND EXPECTATIONS  You have likely been given narcotic medications to help control your pain.  After a traumatic event that results in an fracture (broken bone) with or without surgery, it is ok to use narcotic pain medications to help control one's pain.  We understand that everyone responds to pain differently and each individual patient will be evaluated on a regular basis for the continued need for narcotic medications. Ideally, narcotic medication use should last no more than 6-8 weeks (coinciding with fracture healing).   As a patient it is your responsibility as well to monitor narcotic medication use and report the amount and frequency you use these medications when you come to your office visit.   We would also advise that if you are using narcotic medications, you should take a dose prior to therapy to maximize you participation.  IF YOU ARE ON NARCOTIC MEDICATIONS IT IS NOT PERMISSIBLE TO OPERATE A MOTOR VEHICLE (MOTORCYCLE/CAR/TRUCK/MOPED) OR HEAVY MACHINERY DO NOT MIX NARCOTICS WITH OTHER CNS (CENTRAL NERVOUS SYSTEM) DEPRESSANTS SUCH AS ALCOHOL  Diet: as you were eating previously.  Can use over the counter stool softeners and bowel preparations, such as Miralax, to help with bowel movements.  Narcotics can be constipating.  Be sure to drink plenty of fluids    STOP SMOKING OR USING NICOTINE PRODUCTS!!!!  As discussed nicotine severely impairs your body's ability to heal surgical and traumatic wounds but also impairs bone healing.  Wounds and bone heal by forming microscopic blood vessels (angiogenesis) and nicotine is a vasoconstrictor (essentially,  shrinks blood vessels).  Therefore, if vasoconstriction occurs to these microscopic blood vessels they essentially disappear and are unable to deliver necessary nutrients to the healing tissue.  This is one modifiable factor that you can do to dramatically increase your chances of healing your injury.    (This means no smoking, no nicotine gum, patches, etc)  DO NOT USE NONSTEROIDAL ANTI-INFLAMMATORY DRUGS (NSAID'S)  Using products such as Advil (ibuprofen), Aleve (naproxen), Motrin (ibuprofen) for additional pain control during fracture healing can delay and/or prevent the healing response.  If you would like to take over the counter (OTC) medication, Tylenol (acetaminophen) is ok.  However, some narcotic medications that are given for pain control contain acetaminophen as well. Therefore, you should not exceed more than 4000 mg of tylenol in a day if you do not have liver disease.  Also note that there are may OTC medicines, such as cold medicines and allergy medicines that my contain tylenol as well.  If you have any questions about medications and/or interactions please ask your doctor/PA or your pharmacist.      ICE AND ELEVATE INJURED/OPERATIVE EXTREMITY  Using ice and elevating the injured extremity above your heart can help with swelling and pain control.  Icing in a pulsatile fashion, such as 20 minutes on and 20 minutes off, can be followed.    Do not place ice directly on skin. Make sure there is a barrier between to skin and the ice pack.    Using frozen items such as frozen peas works well as the conform nicely to the are that needs to be iced.  USE AN ACE WRAP OR  TED HOSE FOR SWELLING CONTROL  In addition to icing and elevation, Ace wraps or TED hose are used to help limit and resolve swelling.  It is recommended to use Ace wraps or TED hose until you are informed to stop.    When using Ace Wraps start the wrapping distally (farthest away from the body) and wrap proximally (closer to the  body)   Example: If you had surgery on your leg or thing and you do not have a splint on, start the ace wrap at the toes and work your way up to the thigh        If you had surgery on your upper extremity and do not have a splint on, start the ace wrap at your fingers and work your way up to the upper arm  IF YOU ARE IN A SPLINT OR CAST DO NOT [place]   If your splint gets wet for any reason please contact the office immediately. You may shower in your splint or cast as long as you keep it dry.  This can be done by wrapping in a cast cover or garbage back (or similar)  Do Not stick any thing down your splint or cast such as pencils, money, or hangers to try and scratch yourself with.  If you feel itchy take benadryl as prescribed on the bottle for itching  IF YOU ARE IN A CAM BOOT (BLACK BOOT)  You may remove boot periodically. Perform daily dressing changes as noted below.  Wash the liner of the boot regularly and wear a sock when wearing the boot. It is recommended that you sleep in the boot until told otherwise  [place] OR CONCERTS: [id number]

## 2015-08-02 NOTE — Anesthesia Preprocedure Evaluation (Signed)
Anesthesia Evaluation  Patient identified by MRN, date of birth, ID band Patient awake    Reviewed: Allergy & Precautions, NPO status , Patient's Chart, lab work & pertinent test results  History of Anesthesia Complications Negative for: history of anesthetic complications  Airway Mallampati: II  TM Distance: >3 FB Neck ROM: Full    Dental  (+) Teeth Intact   Pulmonary neg pulmonary ROS,    breath sounds clear to auscultation       Cardiovascular negative cardio ROS   Rhythm:Regular     Neuro/Psych negative neurological ROS  negative psych ROS   GI/Hepatic negative GI ROS, Neg liver ROS,   Endo/Other  negative endocrine ROS  Renal/GU negative Renal ROS     Musculoskeletal   Abdominal   Peds  Hematology   Anesthesia Other Findings   Reproductive/Obstetrics                             Anesthesia Physical Anesthesia Plan  ASA: I  Anesthesia Plan: General and Regional   Post-op Pain Management:    Induction: Intravenous  Airway Management Planned: Oral ETT  Additional Equipment: None  Intra-op Plan:   Post-operative Plan: Extubation in OR  Informed Consent: I have reviewed the patients History and Physical, chart, labs and discussed the procedure including the risks, benefits and alternatives for the proposed anesthesia with the patient or authorized representative who has indicated his/her understanding and acceptance.   Dental advisory given  Plan Discussed with: CRNA and Surgeon  Anesthesia Plan Comments:         Anesthesia Quick Evaluation

## 2015-08-02 NOTE — Brief Op Note (Signed)
08/02/2015  12:41 PM  PATIENT:  Erica Mora  75 y.o. female  PRE-OPERATIVE DIAGNOSIS:   1. LEFT DISTAL RADIUS FRACTURE 2. LEFT SUPRACONDYLAR HUMERUS FRACTURE  POST-OPERATIVE DIAGNOSIS:   1. LEFT DISTAL RADIUS FRACTURE 2. LEFT SUPRACONDYLAR HUMERUS FRACTURE  PROCEDURE:  Procedure(s): 1. OPEN REDUCTION INTERNAL FIXATION (ORIF) LEFT DISTAL RADIUS 2. ORIF LEFT SUPRACONDYLAR HUMERUS FRACTURE (Left) 3. ULNAR NERVE NEUROLYSIS  SURGEON:  Surgeon(s) and Role:    * Altamese [place], MD - Primary  PHYSICIAN ASSISTANT: Laure Kidney, MD  ANESTHESIA:   general supplemented with regional block   I/O:  Total I/O In: 2700 [I.V.:2700] Out: 133 [Urine:58; Blood:75]  SPECIMEN:  No Specimen  TOURNIQUET:   Total Tourniquet Time Documented: Upper Arm (Left) - 116 minutes Total: Upper Arm (Left) - 116 minutes   DICTATION: .[id number]

## 2015-08-03 NOTE — Op Note (Signed)
Erica, Mora            ACCOUNT NO.:  [id number]  MEDICAL RECORD NO.:  [id number]  LOCATION:  [facility]                         FACILITY:  [facility]  PHYSICIAN:  Astrid Divine. Marcelino Scot, M.D. DATE OF BIRTH:  Jun 26, 1940  DATE OF PROCEDURE:  08/02/2015 DATE OF DISCHARGE:  08/02/2015                              OPERATIVE REPORT   PREOPERATIVE DIAGNOSES: 1. Left displaced distal radius fracture. 2. Left displaced supracondylar humerus fracture.  POSTOPERATIVE DIAGNOSES: 1. Left displaced distal radius fracture. 2. Left displaced supracondylar humerus fracture.  PROCEDURE: 1. ORIF of left distal radius. 2. ORIF of left supracondylar humerus fracture.  SURGEON:  Astrid Divine. Marcelino Scot, M.D.  ASSISTANT:  Laure Kidney, RNFA.  ANESTHESIA:  General.  COMPLICATIONS:  None.  TOTAL TOURNIQUET TIME:  116 minutes.  DISPOSITION:  To PACU.  CONDITION:  Stable.  BRIEF SUMMARY OF INDICATIONS FOR PROCEDURE:  Erica Mora is a very pleasant, 75 year old, right-hand-dominant female, who [place] a friend excited to see her, apparently tripped her by accident resulting in left upper extremity fractures.  She has been immobilized in a splint pending surgical repair.  Apparently, I discussed with her the risks and benefits, including possibility of infection, nerve injury, vessel injury, DVT, PE, loss of motion, and others.  We also specifically discussed total elbow arthroplasty which would be a very strong consideration for her, but carries a 5-pound lifting restriction for life.  She wished to proceed with operative repair.  We also discussed anesthetic complications including heart attack, stroke, and many others.  BRIEF SUMMARY OF PROCEDURE:  The patient was taken to the operating room after administration of an interscalene block.  Her left upper extremity was prepped and draped in usual sterile fashion after induction of general anesthesia.  She did receive  preoperative antibiotics.  Her left arm was draped with an extended extremity on the arm table and began with the wrist making a 4 cm volar incision going to the FCR tendon sheath continuing down to the pronator retracting it ulnarly exposing the fracture site cleaning it with curette and lavaged and performing a reduction maneuver which was held provisionally with a K-wire through the radial styloid.  This confirmed on multiple views.  We still needed to restore till.  The plate was affixed to the distal articular segment, and we noted rather poor bone quality as well as very deficient dorsal cortex.  We applied the distal segment with the plate off the bone of the metaphysis, and then as we brought the metaphysis down so that the plate was flushed and corrected and restored the volar tilt.  Final images showed appropriate reduction, hardware placement, trajectory, and length.  Wound was irrigated and closed in standard layered fashion using 2-0 Vicryl for the pronator fascia followed by 2-0 for shallow subcu and then 3-0 nylon horizontal mattress for the skin which was extremely fragile.  Laure Kidney, RNFA, assisted me throughout.  Attention was then turned to the elbow where a posterior incision was made from just proximal to the olecranon up to the midportion of the arm.  We did encounter rather significant venous bleeding and consequently elevated the arm and then inflated the tourniquet to 275  mmHg.  A medial and lateral triceps sparing approach was then made preserving the olecranon and extensor in case she did need subsequent conversion to a total elbow arthroplasty.  We were able to identify the fracture site and removed some comminution that was intra-articular in the intercondylar region.  We then provisionally reduced it and pinned it with K-wires.  A lateral epicondyle distal segment was severely comminuted and a poor structural integrity.  Her bone was extremely soft.  We  were able, however, to mobilize the articular segment and secured it with 3 screws using a medial buttress plate, we also used in compression and then a lateral plate was affixed with 2 distal screws into this articular segment.  We were able to take the elbow through an excellent range of motion and over sewed the capsule and bone laterally with #2 FiberWire.  Wound was irrigated thoroughly including the joint capsule and then closed in standard layered fashion using 0 Vicryl, 2-0 Vicryl, and 3-0 nylon.  Sterile gently compressive dressing was applied. Again, Laure Kidney, RNFA, assisted me throughout.  I did not formally transposed the nerve, but performed a neurolysis well into the ECU and also proximally up toward the intermuscular septum.  This portion of the procedure was not necessary for infection, but was added because of the contusion and swelling.  I did repair the soft tissues underneath the ulnar nerve after performing the neurolysis in order to prevent any irritation with the plate.  The Acumed plating system was used.  PROGNOSIS:  After being placed into a long-arm splint including the wrist, the patient was taken to PACU in stable condition.  We will see how she does there and then either discharge her to home or admit her overnight for pain control.  She will have ice and elevation with unrestricted range of motion of the thumb and digits.  We will plan to see her back for removal of her splint and sutures in 2-3 weeks.  It should be noted there was soft tissue condition was very fragile and during marking of the elbow incision, the felt tip pen actually broke the skin just slightly and her bone quality was also rather impaired. She will need a formal metabolic bone workup in an attempt to optimize this to reduce the chance of further fracture or complications as she resumes her daily living.  Because of the low fracture, was severely comminuted, she was also at  increased risk for nonunion; and we do anticipate being much more conservative and slow with her rehabilitation.  Because of this, which we predispose her to the risk of further loss of motion at the elbow.  I did place infuse allograft along the column and was very careful to keep this completely isolated and away from the joint.     Astrid Divine. Marcelino Scot, M.D.     MHH/MEDQ  D:  08/02/2015  T:  08/03/2015  Job:  [id number]

## 2015-08-03 NOTE — Anesthesia Postprocedure Evaluation (Signed)
Anesthesia Post Note  Patient: Erica Mora  Procedure(s) Performed: Procedure(s) (LRB): OPEN REDUCTION INTERNAL FIXATION (ORIF)LEFT DISTAL RADIUS,HUMERUS FRACTURE (Left)  Patient location during evaluation: PACU Anesthesia Type: General Level of consciousness: awake Pain management: pain level controlled Vital Signs Assessment: post-procedure vital signs reviewed and stable Respiratory status: spontaneous breathing Cardiovascular status: stable Postop Assessment: no signs of nausea or vomiting Anesthetic complications: no    Last Vitals:  Filed Vitals:   08/02/15 1338 08/02/15 1353  BP: 146/85 146/81  Pulse:  79  Temp:    Resp: 14 19    Last Pain: There were no vitals filed for this visit.               Jackquline Branca

## 2015-08-05 ENCOUNTER — Encounter (HOSPITAL_COMMUNITY): Payer: Self-pay | Admitting: Orthopedic Surgery

## 2015-08-09 ENCOUNTER — Encounter (HOSPITAL_COMMUNITY): Payer: Self-pay | Admitting: Orthopedic Surgery

## 2015-08-30 ENCOUNTER — Encounter (HOSPITAL_COMMUNITY): Payer: Self-pay | Admitting: Occupational Therapy

## 2015-08-30 ENCOUNTER — Ambulatory Visit (HOSPITAL_COMMUNITY): Payer: Medicare Other | Attending: Orthopedic Surgery | Admitting: Occupational Therapy

## 2015-08-30 DIAGNOSIS — M25532 Pain in left wrist: Secondary | ICD-10-CM

## 2015-08-30 DIAGNOSIS — M6281 Muscle weakness (generalized): Secondary | ICD-10-CM | POA: Diagnosis present

## 2015-08-30 DIAGNOSIS — M25632 Stiffness of left wrist, not elsewhere classified: Secondary | ICD-10-CM

## 2015-08-30 DIAGNOSIS — M25622 Stiffness of left elbow, not elsewhere classified: Secondary | ICD-10-CM

## 2015-08-30 DIAGNOSIS — Z967 Presence of other bone and tendon implants: Secondary | ICD-10-CM | POA: Insufficient documentation

## 2015-08-30 DIAGNOSIS — Z8781 Personal history of (healed) traumatic fracture: Secondary | ICD-10-CM

## 2015-08-30 DIAGNOSIS — M25522 Pain in left elbow: Secondary | ICD-10-CM | POA: Diagnosis present

## 2015-08-30 DIAGNOSIS — M7989 Other specified soft tissue disorders: Secondary | ICD-10-CM | POA: Diagnosis present

## 2015-08-30 DIAGNOSIS — Z9889 Other specified postprocedural states: Secondary | ICD-10-CM

## 2015-08-30 NOTE — Patient Instructions (Signed)
Self-AAROM Wrist Exercises  1) Forearm Pronation/Supination      1. Interlock your fingers with the affected thumb on top. Hold your wrist to support the affected arm 2. Place the affected arm with the palm of your hand facing upward. 3. Slowly rotate the palm of your hand downward. 4. Repeat ___10___ times.   2) Wrist Flexion/Extension                1. Interlock your fingers with the affected thumb on top. Grasp your affected hand 2. Slowly bend your wrist forward then backward. 3. Repeat ___10___times.            3) Wrist Ulnar/Radial Deviation         1. Interlock your fingers with the affected thumb on top. Grasp your affected hand 2. Slowly bend your wrist towards you then away from you. 3. Repeat __10____times.   

## 2015-08-31 NOTE — Therapy (Addendum)
[place], [place], [address] Phone: [phone]   Fax:  [phone]  Occupational Therapy Evaluation  Patient Details  Name: Erica Mora MRN: [id number] Date of Birth: 10-15-1940 No Data Recorded  Encounter Date: 08/30/2015      OT End of Session - 08/30/15 1652    Visit Number 1   Number of Visits 24   Date for OT Re-Evaluation 10/29/15  mini reassessment 09/28/2015   Authorization Type Medicare/Medicare A& B primary; AARP secondary   Authorization Time Period Before 10th visit   Authorization - Visit Number 1   Authorization - Number of Visits 10   OT Start Time 1300   OT Stop Time 1346   OT Time Calculation (min) 46 min   Activity Tolerance Patient tolerated treatment well   Behavior During Therapy [facility] for tasks assessed/performed      Past Medical History  Diagnosis Date  . Medical history non-contributory     Past Surgical History  Procedure Laterality Date  . Vaginal delivery    . Tubal ligation    . Eye surgery      cataracts- bilateral    . Orif humerus fracture Left 08/02/2015    Procedure: OPEN REDUCTION INTERNAL FIXATION (ORIF)LEFT DISTAL RADIUS,HUMERUS FRACTURE;  Surgeon: Altamese Scotland, MD;  Location: [facility];  Service: Orthopedics;  Laterality: Left;    There were no vitals filed for this visit.  Visit Diagnosis:  S/P ORIF (open reduction internal fixation) fracture  Left upper extremity swelling  Pain in left elbow  Pain in left wrist  Stiffness of left elbow joint  Stiffness of left wrist joint  Muscle weakness of left upper extremity      Subjective Assessment - 08/30/15 1639    Subjective  S: I've been using ice on this arm.    Pertinent History Pt is a 75 y/o female s/p ORIF of the left distal radius and the left distal humerus on 08/02/2015. Pt was at a shag competition when someone tried to pick her up but then fell on top of her, resulting in a distal humerus and  distal radius fracture on 07/26/2015. Pt wore a hard splint for 4 weeks, and presents to evaluation with prefabricated wrist brace. Pt has been taking pain medication and using ice for pain management. Dr. Altamese Glasgow referred to occupational therapy for evaluation and treatment.    Special Tests FOTO Score: 27/100 (73% impairment)   Patient Stated Goals To get the use of my arm back.    Currently in Pain? No/denies  pt took pain medication prior to eval           [facility] OT Assessment - 08/30/15 1258    Assessment   Diagnosis s/p ORIF-left distal radius fx & left distal humerus fx   Onset Date 07/26/15  sx on 08/02/2015   Prior Therapy None   Precautions   Precautions Other (comment)   Precaution Comments Gentle P/ROM, AA/ROM, A/ROM progressing as tolerated, no resistance or strengthening for 8 weeks (09/27/15)   Required Braces or Orthoses Other Brace/Splint   Other Brace/Splint wrist brace   Balance Screen   Has the patient fallen in the past 6 months Yes   How many times? 1   Has the patient had a decrease in activity level because of a fear of falling?  No   Is the patient reluctant to leave their home because of a fear of falling?  No   Home  Environment   Family/patient expects to be discharged to: Private residence   Available Help at Discharge Family   Lives With Spouse   Prior Function   Level of [place] with basic ADLs   Vocation Retired   Leisure gardening, yard work   ADL   ADL comments Pt is having difficulty with all ADL tasks, notably dressing tasks-pulling on socks, pants, grooming tasks-washing & fixing hair, completing housework, cooking tasks   Written Expression   Dominant Hand Right   Vision - History   Baseline Vision No visual deficits   Cognition   Overall Cognitive Status Within Functional Limits for tasks assessed   Observation/Other Assessments   Observations Pt has 8.5 inch incision scar along dorsal elbow    Focus on Therapeutic  Outcomes (FOTO)  27/100 (73% impairment)   Sensation   Light Touch Appears Intact   Additional Comments Pt experiences tingling along forearm and hand occasionally   Edema   Edema Pt with notable edema along left wrist, forearm, and elbow regions. Right mid forearm 19cm, elbow crease 26.5cm, 2" above elbow 25.5cm; left mid forearm 19cm, elbow crease 21cm inches, 2" above elbow 21.5cm   ROM / Strength   AROM / PROM / Strength AROM;PROM;Strength   Palpation   Palpation comment Pt with max fascial restrictions along left wrist, dorsal and volar forearm, elbow, and upper arm regions   AROM   Overall AROM Comments Assessed seated, ER/IR adducted   AROM Assessment Site Elbow;Forearm;Wrist;Shoulder   Right/Left Shoulder Left   Left Shoulder Extension 70 Degrees   Left Shoulder ABduction 66 Degrees   Left Shoulder Internal Rotation 90 Degrees   Left Shoulder External Rotation 48 Degrees   Right/Left Elbow Left   Left Elbow Flexion 82   Left Elbow Extension 56   Right/Left Forearm Left   Left Forearm Pronation 32 Degrees   Left Forearm Supination 72 Degrees   Right/Left Wrist Left   Left Wrist Extension 15 Degrees   Left Wrist Flexion 18 Degrees   Left Wrist Radial Deviation 10 Degrees   Left Wrist Ulnar Deviation 20 Degrees   PROM   Overall PROM Comments Shoulder assessed supine, elbow, forearm, & wrist assessed seated.    PROM Assessment Site Elbow;Forearm;Wrist;Shoulder   Right/Left Shoulder Left   Left Shoulder Flexion 140 Degrees   Left Shoulder ABduction 125 Degrees   Left Shoulder Internal Rotation 90 Degrees   Left Shoulder External Rotation 42 Degrees   Right/Left Elbow Left   Left Elbow Flexion 84   Left Elbow Extension 49   Right/Left Forearm Left   Left Forearm Pronation 18 Degrees   Left Forearm Supination 88 Degrees   Right/Left Wrist Left   Left Wrist Extension 29 Degrees   Left Wrist Flexion 25 Degrees   Left Wrist Radial Deviation 10 Degrees   Left Wrist Ulnar  Deviation 20 Degrees   Strength   Overall Strength Comments Not tested at evaluation                         OT Education - 08/30/15 1651    Education provided Yes   Education Details gentle self-ROM exercises for wrist   Person(s) Educated Patient   Methods Explanation;Demonstration;Handout   Comprehension Verbalized understanding;Returned demonstration          OT Short Term Goals - 08/31/15 1025    OT SHORT TERM GOAL #1   Title Pt will be provided with and educated on HEP.  Time 4   Period Weeks   Status New   OT SHORT TERM GOAL #2   Title Pt will decrease fascial restrictions from max to mod amounts to increase joint mobility of the LUE at the elbow and wrist.    Time 4   Period Weeks   Status New   OT SHORT TERM GOAL #3   Title Pt will decrease pain in LUE to 3/10 or less to increase ability to perform HEP   Time 4   Period Weeks   Status New   OT SHORT TERM GOAL #4   Title Pt will increase left elbow P/ROM to [facility] to increase ability use LUE as assist during dressing tasks.    Time 4   Period Weeks   Status New   OT SHORT TERM GOAL #5   Title Pt will increase left wrist P/ROM to [facility] to increase ability to use LUE as assist during grooming tasks.    Time 4   Period Weeks   Status New   Additional Short Term Goals   Additional Short Term Goals Yes   OT SHORT TERM GOAL #6   Title Pt will increase LUE strength to 3+/5 to increase ability to use LUE as assist when completing light housework tasks.    Time 8   Period Weeks   Status New           OT Long Term Goals - 08/31/15 1029    OT LONG TERM GOAL #1   Title Pt will return to prior level of functioning and independence during daily and leisure tasks.    Time 8   Period Weeks   Status New   OT LONG TERM GOAL #2   Title Pt will decrease fascial restrictions in LUE from mod to min amounts or less to increase mobility during functional use of LUE.    Time 8   Period Weeks   Status New    OT LONG TERM GOAL #3   Title Pt will decrease pain to 1/10 or less to increase ability to actively use  LUE during daily tasks.    Time 8   Period Weeks   Status New   OT LONG TERM GOAL #4   Title Pt will increase A/ROM of left elbow to [facility] to improve ability to wash and style hair independently.    Time 8   Period Weeks   Status New   OT LONG TERM GOAL #5   Title Pt will increase A/ROM of left wrist to [facility] to increase ability to functionally use LUE when preparing meals.    Time 8   Period Weeks   Status New   Long Term Additional Goals   Additional Long Term Goals Yes   OT LONG TERM GOAL #6   Title Pt will increase LUE strength to 4/5 to increase ability to lift weighted objects such as pots and pans.   Time 8   Period Weeks   Status New               Plan - 08/30/15 1653    Clinical Impression Statement A: Pt is a 75 y/o female s/p ORIF of the left distal radius and left distal humerus on 08/02/2015, after a fall on 07/26/2015. Pt presents with left prefabricated wrist brace, reports she wears it during the day but does not sleep in it. Pt presents with increased pain, edema, and fascial restrictions, decreased range of motion and strength of the LUE,  limiting completion and participation of ADL and leisure tasks.    Pt will benefit from skilled therapeutic intervention in order to improve on the following deficits (Retired) Decreased strength;Impaired sensation;Pain;Increased edema;Impaired UE functional use;Decreased range of motion;Increased fascial restricitons;Decreased skin integrity;Impaired flexibility;Decreased scar mobility   Rehab Potential Good   OT Frequency 3x / week   OT Duration 8 weeks   OT Treatment/Interventions Self-care/ADL training;Ultrasound;DME and/or AE instruction;Scar mobilization;Passive range of motion;Patient/family education;Cryotherapy;Electrical Stimulation;Splinting;Moist Heat;Therapeutic exercise;Manual Therapy;Therapeutic activities    Plan P: Pt will benefit from skilled occupational therapy services to decrease edema, pain, and fascial restrictions, increase range of motion and strength of the LUE to increase ability to use the LUE functionally during daily and leisure tasks. Treatment Plan: Myofascial release, edema management, P/ROM, AA/ROM, A/ROM, gentle LUE grip and pinch strengthening when approved by MD, modalities as necessary, pt education.    OT Home Exercise Plan self-ROM for wrist, edema management techniques   Consulted and Agree with Plan of Care Patient          G-Codes - 2015/09/10 1042    Functional Assessment Tool Used FOTO Score: 27/100 (73% impairment)   Functional Limitation Carrying, moving and handling objects   Carrying, Moving and Handling Objects Current Status [id number]) At least 60 percent but less than 80 percent impaired, limited or restricted   Carrying, Moving and Handling Objects Goal Status [id number]) At least 40 percent but less than 60 percent impaired, limited or restricted      Problem List Patient Active Problem List   Diagnosis Date Noted  . Humerus distal fracture 08/02/2015   Guadelupe Sabin, OTR/L  [phone]  09/10/15, 10:43 AM  [place], [place], [address] Phone: [phone]   Fax:  [phone]  Name: Erica Mora MRN: [id number] Date of Birth: 03/25/1941

## 2015-09-03 ENCOUNTER — Encounter (HOSPITAL_COMMUNITY): Payer: Self-pay

## 2015-09-03 ENCOUNTER — Ambulatory Visit (HOSPITAL_COMMUNITY): Payer: Medicare Other

## 2015-09-03 DIAGNOSIS — M6281 Muscle weakness (generalized): Secondary | ICD-10-CM

## 2015-09-03 DIAGNOSIS — M25522 Pain in left elbow: Secondary | ICD-10-CM

## 2015-09-03 DIAGNOSIS — Z9889 Other specified postprocedural states: Secondary | ICD-10-CM

## 2015-09-03 DIAGNOSIS — M7989 Other specified soft tissue disorders: Secondary | ICD-10-CM

## 2015-09-03 DIAGNOSIS — M25632 Stiffness of left wrist, not elsewhere classified: Secondary | ICD-10-CM

## 2015-09-03 DIAGNOSIS — M25622 Stiffness of left elbow, not elsewhere classified: Secondary | ICD-10-CM

## 2015-09-03 DIAGNOSIS — M25532 Pain in left wrist: Secondary | ICD-10-CM

## 2015-09-03 DIAGNOSIS — Z8781 Personal history of (healed) traumatic fracture: Principal | ICD-10-CM

## 2015-09-03 DIAGNOSIS — Z967 Presence of other bone and tendon implants: Secondary | ICD-10-CM | POA: Diagnosis not present

## 2015-09-03 NOTE — Therapy (Signed)
[place], [place], [address] Phone: [phone]   Fax:  [phone]  Occupational Therapy Treatment  Patient Details  Name: Erica Mora MRN: [id number] Date of Birth: 08-09-1940 Referring Provider: Altamese [place], MD  Encounter Date: 09/03/2015      OT End of Session - 09/03/15 1648    Visit Number 2   Number of Visits 24   Date for OT Re-Evaluation 10/29/15  mini reassessment 09/28/2015   Authorization Type Medicare/Medicare A& B primary; [employer] secondary   Authorization Time Period Before 10th visit   Authorization - Visit Number 2   Authorization - Number of Visits 10   OT Start Time 1605   OT Stop Time 1645   OT Time Calculation (min) 40 min   Activity Tolerance Patient tolerated treatment well   Behavior During Therapy [facility] for tasks assessed/performed      Past Medical History  Diagnosis Date  . Medical history non-contributory     Past Surgical History  Procedure Laterality Date  . Vaginal delivery    . Tubal ligation    . Eye surgery      cataracts- bilateral    . Orif humerus fracture Left 08/02/2015    Procedure: OPEN REDUCTION INTERNAL FIXATION (ORIF)LEFT DISTAL RADIUS,HUMERUS FRACTURE;  Surgeon: Altamese Wynot, MD;  Location: [facility];  Service: Orthopedics;  Laterality: Left;    There were no vitals filed for this visit.  Visit Diagnosis:  S/P ORIF (open reduction internal fixation) fracture  Left upper extremity swelling  Pain in left elbow  Pain in left wrist  Stiffness of left elbow joint  Stiffness of left wrist joint  Muscle weakness of left upper extremity      Subjective Assessment - 09/03/15 1612    Subjective  S: This is the problem right here. (Pointing to elbow)   Currently in Pain? Yes   Pain Score 3    Pain Location Arm   Multiple Pain Sites No            [facility] OT Assessment - 09/03/15 1610    Assessment   Diagnosis s/p ORIF-left distal radius fx &  left distal humerus fx   Referring Provider Altamese , MD   Precautions   Precautions Other (comment)   Precaution Comments Gentle P/ROM, AA/ROM, A/ROM progressing as tolerated, no resistance or strengthening for 8 weeks   Required Braces or Orthoses Other Brace/Splint   Other Brace/Splint wrist brace   Restrictions   Weight Bearing Restrictions Yes   LUE Weight Bearing Non weight bearing                  OT Treatments/Exercises (OP) - 09/03/15 1610    Exercises   Exercises Elbow;Wrist;Shoulder;Hand   Shoulder Exercises: Supine   External Rotation PROM;10 reps   Internal Rotation PROM;10 reps   Flexion PROM;10 reps   ABduction PROM;10 reps   Elbow Exercises   Elbow Extension PROM;AROM;10 reps;Supine   Forearm Supination PROM;AROM;10 reps;Supine   Forearm Pronation PROM;AROM;10 reps;Supine   Wrist Flexion PROM;AROM;10 reps;Supine   Wrist Extension PROM;AROM;10 reps;Supine   Other elbow exercises PROM; AROM; elbow flexion; 10X; supine   Wrist Exercises   Wrist Radial Deviation PROM;10 reps;Supine   Wrist Ulnar Deviation PROM;10 reps;Supine   Hand Exercises   MCPJ Flexion PROM;AROM;10 reps   MCPJ Extension PROM;AROM;10 reps   PIPJ Flexion PROM;AROM;10 reps   PIPJ Extension PROM;AROM;10 reps   DIPJ Flexion PROM;AROM;10 reps   DIPJ Extension  PROM;AROM;10 reps   Digit Composite ABduction AROM;10 reps;Supine   Digit Composite ADduction AROM;10 reps;Supine   Opposition AROM;5 reps;Supine   Manual Therapy   Manual Therapy Edema management;Myofascial release   Manual therapy comments Manual therapy was completed prior to exercises.   Edema Management Edema management completed to left hand, wrist, and elbow region.    Myofascial Release Myofascial release and manual stretching to left hand/digits, wrist, elbow region to decrease fascial restrictions and increase joint mobility in a pain free zone.                 OT Education - 09/03/15 1713    Education  provided Yes   Education Details Patient was given OT evaluation handout and goals were reviewed. Encouraged patient to continue to use ice and UE positioning to decrease edema.   Person(s) Educated Patient   Methods Explanation   Comprehension Verbalized understanding          OT Short Term Goals - 09/03/15 1613    OT SHORT TERM GOAL #1   Title Pt will be provided with and educated on HEP.    Time 4   Period Weeks   Status On-going   OT SHORT TERM GOAL #2   Title Pt will decrease fascial restrictions from max to mod amounts to increase joint mobility of the LUE at the elbow and wrist.    Time 4   Period Weeks   Status On-going   OT SHORT TERM GOAL #3   Title Pt will decrease pain in LUE to 3/10 or less to increase ability to perform HEP   Time 4   Period Weeks   Status On-going   OT SHORT TERM GOAL #4   Title Pt will increase left elbow P/ROM to [facility] to increase ability use LUE as assist during dressing tasks.    Time 4   Period Weeks   Status New   OT SHORT TERM GOAL #5   Title Pt will increase left wrist P/ROM to [facility] to increase ability to use LUE as assist during grooming tasks.    Time 4   Period Weeks   Status On-going   OT SHORT TERM GOAL #6   Title Pt will increase LUE strength to 3+/5 to increase ability to use LUE as assist when completing light housework tasks.    Time 8   Period Weeks   Status On-going           OT Long Term Goals - 09/03/15 1614    OT LONG TERM GOAL #1   Title Pt will return to prior level of functioning and independence during daily and leisure tasks.    Time 8   Period Weeks   Status On-going   OT LONG TERM GOAL #2   Title Pt will decrease fascial restrictions in LUE from mod to min amounts or less to increase mobility during functional use of LUE.    Time 8   Period Weeks   Status On-going   OT LONG TERM GOAL #3   Title Pt will decrease pain to 1/10 or less to increase ability to actively use  LUE during daily tasks.    Time  8   Period Weeks   Status On-going   OT LONG TERM GOAL #4   Title Pt will increase A/ROM of left elbow to [facility] to improve ability to wash and style hair independently.    Time 8   Period Weeks   Status On-going  OT LONG TERM GOAL #5   Title Pt will increase A/ROM of left wrist to [facility] to increase ability to functionally use LUE when preparing meals.    Time 8   Period Weeks   Status On-going   OT LONG TERM GOAL #6   Title Pt will increase LUE strength to 4/5 to increase ability to lift weighted objects such as pots and pans.   Time 8   Period Weeks   Status On-going               Plan - 09/03/15 1650    Clinical Impression Statement A: Patient presents with increased pain, edema, and fascial restrictions as well as decreased range of motion and strength of the LUE. Patient showed good results after myofascial release and manual techniques with slight increase P/ROM and A/ROM from beginning to end of session. Patient with increased redness, edema, and skin temperature along left  medial aspect of elbow. Therapist encouraged patient to bring MD's attention to area at follow up visit tomorrow to rule out DVT. Myofascial release was eliminated from area.   Plan P: Continue with myofascial release, edema management, P/ROM, and A/ROM. Follow up on MD appointment.        Problem List Patient Active Problem List   Diagnosis Date Noted  . Humerus distal fracture 08/02/2015    Ailene Ravel, OTR/L,CBIS  [phone]  09/03/2015, 5:18 PM  [place] [address] [place], [place], [address] Phone: [phone]   Fax:  [phone]  Name: Erica Mora MRN: [id number] Date of Birth: 1941-04-17

## 2015-09-06 ENCOUNTER — Encounter (HOSPITAL_COMMUNITY): Payer: Self-pay | Admitting: Occupational Therapy

## 2015-09-06 ENCOUNTER — Ambulatory Visit (HOSPITAL_COMMUNITY): Payer: Medicare Other | Admitting: Occupational Therapy

## 2015-09-06 DIAGNOSIS — M25632 Stiffness of left wrist, not elsewhere classified: Secondary | ICD-10-CM

## 2015-09-06 DIAGNOSIS — M6281 Muscle weakness (generalized): Secondary | ICD-10-CM

## 2015-09-06 DIAGNOSIS — M25622 Stiffness of left elbow, not elsewhere classified: Secondary | ICD-10-CM

## 2015-09-06 DIAGNOSIS — M25522 Pain in left elbow: Secondary | ICD-10-CM

## 2015-09-06 DIAGNOSIS — Z967 Presence of other bone and tendon implants: Secondary | ICD-10-CM | POA: Diagnosis not present

## 2015-09-06 DIAGNOSIS — M7989 Other specified soft tissue disorders: Secondary | ICD-10-CM

## 2015-09-06 DIAGNOSIS — M25532 Pain in left wrist: Secondary | ICD-10-CM

## 2015-09-06 NOTE — Therapy (Signed)
[place], [place], [address] Phone: [phone]   Fax:  [phone]  Occupational Therapy Treatment  Patient Details  Name: Erica Mora MRN: [id number] Date of Birth: 1941/01/25 Referring Provider: Altamese [place], MD  Encounter Date: 09/06/2015      OT End of Session - 09/06/15 1349    Visit Number 3   Number of Visits 24   Date for OT Re-Evaluation 10/29/15  mini reassessment 09/28/2015   Authorization Type Medicare/Medicare A& B primary; AARP secondary   Authorization Time Period Before 10th visit   Authorization - Visit Number 3   Authorization - Number of Visits 10   OT Start Time 1302   OT Stop Time 1346   OT Time Calculation (min) 44 min   Activity Tolerance Patient tolerated treatment well   Behavior During Therapy [facility] for tasks assessed/performed      Past Medical History  Diagnosis Date  . Medical history non-contributory     Past Surgical History  Procedure Laterality Date  . Vaginal delivery    . Tubal ligation    . Eye surgery      cataracts- bilateral    . Orif humerus fracture Left 08/02/2015    Procedure: OPEN REDUCTION INTERNAL FIXATION (ORIF)LEFT DISTAL RADIUS,HUMERUS FRACTURE;  Surgeon: Altamese Hudson, MD;  Location: [facility];  Service: Orthopedics;  Laterality: Left;    There were no vitals filed for this visit.  Visit Diagnosis:  Left upper extremity swelling  Pain in left elbow  Pain in left wrist  Stiffness of left elbow joint  Stiffness of left wrist joint  Muscle weakness of left upper extremity      Subjective Assessment - 09/06/15 1303    Subjective  S: The PA or whoever I saw said I didn't need to worry about a blood clot.    Currently in Pain? No/denies            [facility] OT Assessment - 09/06/15 1348    Assessment   Diagnosis s/p ORIF-left distal radius fx & left distal humerus fx   Precautions   Precautions Other (comment)   Precaution Comments  Gentle P/ROM, AA/ROM, A/ROM progressing as tolerated, no resistance or strengthening for 8 weeks   Required Braces or Orthoses Other Brace/Splint   Other Brace/Splint wrist brace   Restrictions   Weight Bearing Restrictions Yes   LUE Weight Bearing Non weight bearing                  OT Treatments/Exercises (OP) - 09/06/15 1304    Exercises   Exercises Elbow;Wrist;Shoulder;Hand   Shoulder Exercises: Supine   External Rotation PROM;10 reps   Internal Rotation PROM;10 reps   Flexion PROM;10 reps   ABduction PROM;10 reps   Elbow Exercises   Elbow Extension PROM;AROM;10 reps;Supine   Forearm Supination PROM;AROM;10 reps;Supine   Forearm Pronation PROM;AROM;10 reps;Supine   Wrist Flexion PROM;AROM;10 reps;Supine   Wrist Extension PROM;AROM;10 reps;Supine   Other elbow exercises PROM; AROM; elbow flexion; 10X; supine   Wrist Exercises   Wrist Radial Deviation PROM;10 reps;Supine   Wrist Ulnar Deviation PROM;10 reps;Supine   Hand Exercises   MCPJ Flexion PROM;AROM;10 reps   MCPJ Extension PROM;AROM;10 reps   PIPJ Flexion PROM;AROM;10 reps   PIPJ Extension PROM;AROM;10 reps   DIPJ Flexion PROM;AROM;10 reps   DIPJ Extension PROM;AROM;10 reps   Digit Composite ABduction AROM;10 reps;Supine   Digit Composite ADduction AROM;10 reps;Supine   Opposition AROM;5 reps;Supine   Manual  Therapy   Manual Therapy Edema management;Myofascial release   Manual therapy comments Manual therapy was completed prior to exercises.   Edema Management Edema management completed to left hand, wrist, and elbow region.    Myofascial Release Myofascial release and manual stretching to left hand/digits, wrist, elbow region to decrease fascial restrictions and increase joint mobility in a pain free zone.                   OT Short Term Goals - 09/03/15 1613    OT SHORT TERM GOAL #1   Title Pt will be provided with and educated on HEP.    Time 4   Period Weeks   Status On-going   OT  SHORT TERM GOAL #2   Title Pt will decrease fascial restrictions from max to mod amounts to increase joint mobility of the LUE at the elbow and wrist.    Time 4   Period Weeks   Status On-going   OT SHORT TERM GOAL #3   Title Pt will decrease pain in LUE to 3/10 or less to increase ability to perform HEP   Time 4   Period Weeks   Status On-going   OT SHORT TERM GOAL #4   Title Pt will increase left elbow P/ROM to [facility] to increase ability use LUE as assist during dressing tasks.    Time 4   Period Weeks   Status New   OT SHORT TERM GOAL #5   Title Pt will increase left wrist P/ROM to [facility] to increase ability to use LUE as assist during grooming tasks.    Time 4   Period Weeks   Status On-going   OT SHORT TERM GOAL #6   Title Pt will increase LUE strength to 3+/5 to increase ability to use LUE as assist when completing light housework tasks.    Time 8   Period Weeks   Status On-going           OT Long Term Goals - 09/03/15 1614    OT LONG TERM GOAL #1   Title Pt will return to prior level of functioning and independence during daily and leisure tasks.    Time 8   Period Weeks   Status On-going   OT LONG TERM GOAL #2   Title Pt will decrease fascial restrictions in LUE from mod to min amounts or less to increase mobility during functional use of LUE.    Time 8   Period Weeks   Status On-going   OT LONG TERM GOAL #3   Title Pt will decrease pain to 1/10 or less to increase ability to actively use  LUE during daily tasks.    Time 8   Period Weeks   Status On-going   OT LONG TERM GOAL #4   Title Pt will increase A/ROM of left elbow to [facility] to improve ability to wash and style hair independently.    Time 8   Period Weeks   Status On-going   OT LONG TERM GOAL #5   Title Pt will increase A/ROM of left wrist to [facility] to increase ability to functionally use LUE when preparing meals.    Time 8   Period Weeks   Status On-going   OT LONG TERM GOAL #6   Title Pt will increase  LUE strength to 4/5 to increase ability to lift weighted objects such as pots and pans.   Time 8   Period Weeks   Status On-going  Plan - 09/06/15 1349    Clinical Impression Statement A: Pt reports MD appt went well, PA-C determined pt does not have DVT, redness and swelling from irritation of internal hardware. Pt has been released from sling, and only wears brace when out in community. Pt with slight redness and heat along medial elbow this session, continued myofascial release and edema management, incorporating along medial elbow.    Plan P: Continue with myofascial release and edema management, working to increase P/ROM and A/ROM of elbow, wrist, and forearm. Provide A/ROM HEP.         Problem List Patient Active Problem List   Diagnosis Date Noted  . Humerus distal fracture 08/02/2015   Guadelupe Sabin, OTR/L  [phone]  09/06/2015, 2:13 PM  [place], [place], [address] Phone: [phone]   Fax:  [phone]  Name: Fumie Devries MRN: [id number] Date of Birth: [birth year]

## 2015-09-11 ENCOUNTER — Ambulatory Visit (HOSPITAL_COMMUNITY): Payer: Medicare Other | Admitting: Occupational Therapy

## 2015-09-11 ENCOUNTER — Encounter (HOSPITAL_COMMUNITY): Payer: Self-pay | Admitting: Occupational Therapy

## 2015-09-11 DIAGNOSIS — M7989 Other specified soft tissue disorders: Secondary | ICD-10-CM

## 2015-09-11 DIAGNOSIS — M25522 Pain in left elbow: Secondary | ICD-10-CM

## 2015-09-11 DIAGNOSIS — M25622 Stiffness of left elbow, not elsewhere classified: Secondary | ICD-10-CM

## 2015-09-11 DIAGNOSIS — M25632 Stiffness of left wrist, not elsewhere classified: Secondary | ICD-10-CM

## 2015-09-11 DIAGNOSIS — M6281 Muscle weakness (generalized): Secondary | ICD-10-CM

## 2015-09-11 DIAGNOSIS — M25532 Pain in left wrist: Secondary | ICD-10-CM

## 2015-09-11 DIAGNOSIS — Z967 Presence of other bone and tendon implants: Secondary | ICD-10-CM | POA: Diagnosis not present

## 2015-09-11 NOTE — Therapy (Signed)
[place], [place], [address] Phone: [phone]   Fax:  [phone]  Occupational Therapy Treatment  Patient Details  Name: Erica Mora MRN: [id number] Date of Birth: July 10, 1940 Referring Provider: Altamese [place], MD  Encounter Date: 09/11/2015      OT End of Session - 09/11/15 1432    Visit Number 4   Number of Visits 24   Date for OT Re-Evaluation 10/29/15  mini reassessment 09/28/2015   Authorization Type Medicare/Medicare A& B primary; AARP secondary   Authorization Time Period Before 10th visit   Authorization - Visit Number 4   Authorization - Number of Visits 10   OT Start Time 1301   OT Stop Time 1346   OT Time Calculation (min) 45 min   Activity Tolerance Patient tolerated treatment well   Behavior During Therapy [facility] for tasks assessed/performed      Past Medical History  Diagnosis Date  . Medical history non-contributory     Past Surgical History  Procedure Laterality Date  . Vaginal delivery    . Tubal ligation    . Eye surgery      cataracts- bilateral    . Orif humerus fracture Left 08/02/2015    Procedure: OPEN REDUCTION INTERNAL FIXATION (ORIF)LEFT DISTAL RADIUS,HUMERUS FRACTURE;  Surgeon: Altamese [place], MD;  Location: [facility];  Service: Orthopedics;  Laterality: Left;    There were no vitals filed for this visit.  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    Currently in Pain? No/denies            [facility] OT Assessment - 09/11/15 1302    Assessment   Diagnosis s/p ORIF-left distal radius fx & left distal humerus fx   Precautions   Precautions Other (comment)   Precaution Comments Gentle P/ROM, AA/ROM,  A/ROM progressing as tolerated, no resistance or strengthening for 8 weeks   Required Braces or Orthoses Other Brace/Splint   Other Brace/Splint wrist brace   Restrictions   Weight Bearing Restrictions Yes   LUE Weight Bearing Non weight bearing                  OT Treatments/Exercises (OP) - 09/11/15 1304    Exercises   Exercises Elbow;Wrist;Shoulder;Hand   Shoulder Exercises: Supine   External Rotation PROM;10 reps   Internal Rotation PROM;10 reps   Flexion PROM;10 reps   ABduction PROM;10 reps   Elbow Exercises   Elbow Extension PROM;Supine;AROM;Seated;10 reps   Forearm Supination PROM;Supine;AROM;Seated;10 reps   Forearm Pronation PROM;Supine;AROM;Seated;10 reps   Wrist Flexion PROM;Supine;AROM;Seated;10 reps   Wrist Extension PROM;Supine;AROM;Seated;10 reps   Other elbow exercises PROM supine; AROM seated; elbow flexion; 10X   Wrist Exercises   Wrist Radial Deviation PROM;Supine;AROM;Seated;10 reps   Wrist Ulnar Deviation PROM;Supine;AROM;Seated;10 reps   Manual Therapy   Manual Therapy Edema management;Myofascial release   Manual therapy comments Manual therapy was completed prior to exercises.   Edema Management Edema management completed to left hand, wrist, and elbow region.    Myofascial Release Myofascial release and manual stretching to left hand/digits, wrist, elbow region to decrease fascial restrictions and increase joint mobility in a pain free  zone.                 OT Education - 09/11/15 1432    Education provided Yes   Education Details wrist, forearm, and elbow A/ROM exercises   Person(s) Educated Patient   Methods Explanation;Demonstration;Handout   Comprehension Verbalized understanding;Returned demonstration          OT Short Term Goals - 09/03/15 1613    OT SHORT TERM GOAL #1   Title Pt will be provided with and educated on HEP.    Time 4   Period Weeks   Status On-going   OT SHORT TERM GOAL #2   Title Pt will decrease  fascial restrictions from max to mod amounts to increase joint mobility of the LUE at the elbow and wrist.    Time 4   Period Weeks   Status On-going   OT SHORT TERM GOAL #3   Title Pt will decrease pain in LUE to 3/10 or less to increase ability to perform HEP   Time 4   Period Weeks   Status On-going   OT SHORT TERM GOAL #4   Title Pt will increase left elbow P/ROM to [facility] to increase ability use LUE as assist during dressing tasks.    Time 4   Period Weeks   Status New   OT SHORT TERM GOAL #5   Title Pt will increase left wrist P/ROM to [facility] to increase ability to use LUE as assist during grooming tasks.    Time 4   Period Weeks   Status On-going   OT SHORT TERM GOAL #6   Title Pt will increase LUE strength to 3+/5 to increase ability to use LUE as assist when completing light housework tasks.    Time 8   Period Weeks   Status On-going           OT Long Term Goals - 09/03/15 1614    OT LONG TERM GOAL #1   Title Pt will return to prior level of functioning and independence during daily and leisure tasks.    Time 8   Period Weeks   Status On-going   OT LONG TERM GOAL #2   Title Pt will decrease fascial restrictions in LUE from mod to min amounts or less to increase mobility during functional use of LUE.    Time 8   Period Weeks   Status On-going   OT LONG TERM GOAL #3   Title Pt will decrease pain to 1/10 or less to increase ability to actively use  LUE during daily tasks.    Time 8   Period Weeks   Status On-going   OT LONG TERM GOAL #4   Title Pt will increase A/ROM of left elbow to [facility] to improve ability to wash and style hair independently.    Time 8   Period Weeks   Status On-going   OT LONG TERM GOAL #5   Title Pt will increase A/ROM of left wrist to [facility] to increase ability to functionally use LUE when preparing meals.    Time 8   Period Weeks   Status On-going   OT LONG TERM GOAL #6   Title Pt will increase LUE strength to 4/5 to increase ability to lift  weighted objects such as pots and pans.   Time 8   Period Weeks   Status On-going               Plan - 09/11/15 1433  Clinical Impression Statement A: Pt presents today with noticably less edema and redness in medial elbow, no heat noted. 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 Pt has been using ice for pain management. Continued P/ROM and A/ROM exercises this session, along with edema management & manual therapy. Pt provided with and educated on wrist, forearm, and elbow A/ROM exercises.    Plan P: Continue with edema management and manual therapy, follow up on A/ROM HEP        Problem List Patient Active Problem List   Diagnosis Date Noted  . Humerus distal fracture 08/02/2015    Guadelupe Sabin, OTR/L  [phone]  09/11/2015, 2:37 PM  [place], [place], [address] Phone: [phone]   Fax:  [phone]  Name: Erica Mora MRN: [id number] Date of Birth: Jul 15, 1940

## 2015-09-11 NOTE — Patient Instructions (Signed)
AROM Exercises   1) Wrist Flexion  Start with wrist at edge of table, palm facing up. With wrist hanging slightly off table, curl wrist upward, and back down.      2) Wrist Extension  Start with wrist at edge of table, palm facing down. With wrist slightly off the edge of the table, curl wrist up and back down.      3) Radial Deviations  Start with forearm flat against a table, wrist hanging slightly off the edge, and palm facing the wall. Bending at the wrist only, and keeping palm facing the wall, bend wrist so fist is pointing towards the floor, back up to start position, and up towards the ceiling. Return to start.        4) WRIST PRONATION  Turn your forearm towards palm face down.  Keep your elbow bent and by the side of your  Body.      5) WRIST SUPINATION  Turn your forearm towards palm face up.  Keep your elbow bent and by the side of your  Body.     6) Elbow Flexion & Extension Bend your elbow upwards as shown and then lower to a straighten position.    *Complete exercises _10-15_____ times each, __1-2_____ times per day*

## 2015-09-13 ENCOUNTER — Ambulatory Visit (HOSPITAL_COMMUNITY): Payer: Medicare Other | Admitting: Occupational Therapy

## 2015-09-13 ENCOUNTER — Encounter (HOSPITAL_COMMUNITY): Payer: Self-pay | Admitting: Occupational Therapy

## 2015-09-13 DIAGNOSIS — M25622 Stiffness of left elbow, not elsewhere classified: Secondary | ICD-10-CM

## 2015-09-13 DIAGNOSIS — M25532 Pain in left wrist: Secondary | ICD-10-CM

## 2015-09-13 DIAGNOSIS — M7989 Other specified soft tissue disorders: Secondary | ICD-10-CM

## 2015-09-13 DIAGNOSIS — M25522 Pain in left elbow: Secondary | ICD-10-CM

## 2015-09-13 DIAGNOSIS — M25632 Stiffness of left wrist, not elsewhere classified: Secondary | ICD-10-CM

## 2015-09-13 DIAGNOSIS — M6281 Muscle weakness (generalized): Secondary | ICD-10-CM

## 2015-09-13 DIAGNOSIS — Z967 Presence of other bone and tendon implants: Secondary | ICD-10-CM | POA: Diagnosis not present

## 2015-09-13 NOTE — Therapy (Signed)
[place], [place], [address] Phone: [phone]   Fax:  [phone]  Occupational Therapy Treatment  Patient Details  Name: Erica Mora MRN: [id number] Date of Birth: 1940/10/06 Referring Provider: Altamese [place], MD  Encounter Date: 09/13/2015      OT End of Session - 09/13/15 1520    Visit Number 5   Number of Visits 24   Date for OT Re-Evaluation 10/29/15  mini reassessment 09/28/2015   Authorization Type Medicare/Medicare A& B primary; [employer] secondary   Authorization Time Period Before 10th visit   Authorization - Visit Number 5   Authorization - Number of Visits 10   OT Start Time 1345   OT Stop Time 1431   OT Time Calculation (min) 46 min   Activity Tolerance Patient tolerated treatment well   Behavior During Therapy [facility] for tasks assessed/performed      Past Medical History  Diagnosis Date  . Medical history non-contributory     Past Surgical History  Procedure Laterality Date  . Vaginal delivery    . Tubal ligation    . Eye surgery      cataracts- bilateral    . Orif humerus fracture Left 08/02/2015    Procedure: OPEN REDUCTION INTERNAL FIXATION (ORIF)LEFT DISTAL RADIUS,HUMERUS FRACTURE;  Surgeon: Altamese , MD;  Location: [facility];  Service: Orthopedics;  Laterality: Left;    There were no vitals filed for this visit.  Visit Diagnosis:  Left upper extremity swelling  Pain in left elbow  Pain in left wrist  Stiffness of left elbow joint  Stiffness of left wrist joint  Muscle weakness of left upper extremity      Subjective Assessment - 09/13/15 1350    Subjective  S: I can squeeze a washcloth out now.    Currently in Pain? No/denies            [facility] OT Assessment - 09/13/15 1349    Assessment   Diagnosis s/p ORIF-left distal radius fx & left distal humerus fx   Precautions   Precautions Other (comment)   Precaution Comments Gentle P/ROM, AA/ROM, A/ROM progressing  as tolerated, no resistance or strengthening for 8 weeks   Required Braces or Orthoses Other Brace/Splint   Other Brace/Splint wrist brace   Restrictions   Weight Bearing Restrictions Yes   LUE Weight Bearing Non weight bearing                  OT Treatments/Exercises (OP) - 09/13/15 1350    Exercises   Exercises Elbow;Wrist;Shoulder;Hand   Shoulder Exercises: Supine   External Rotation PROM;10 reps   Internal Rotation PROM;10 reps   Flexion PROM;10 reps   ABduction PROM;10 reps   Shoulder Exercises: Therapy Ball   Other Therapy Ball Exercises 10 therapy ball reps working on elbow flexion and extension, forearm supination, as well as shoulder flexion   Elbow Exercises   Elbow Extension PROM;Supine;AROM;Seated;10 reps   Forearm Supination PROM;Supine;AROM;Seated;10 reps   Forearm Pronation PROM;Supine;AROM;Seated;10 reps   Wrist Flexion PROM;Supine;AROM;Seated;10 reps   Wrist Extension PROM;Supine;AROM;Seated;10 reps   Other elbow exercises PROM supine; AROM seated; elbow flexion; 10X   Other elbow exercises isometric strengthening elbow flexion and extension, 3X5"   Manual Therapy   Manual Therapy Edema management;Myofascial release   Manual therapy comments Manual therapy was completed prior to exercises.   Edema Management Edema management completed to left hand, wrist, and elbow region.    Myofascial Release Myofascial release and manual stretching  to left hand/digits, wrist, elbow region to decrease fascial restrictions and increase joint mobility in a pain free zone.                   OT Short Term Goals - 09/03/15 1613    OT SHORT TERM GOAL #1   Title Pt will be provided with and educated on HEP.    Time 4   Period Weeks   Status On-going   OT SHORT TERM GOAL #2   Title Pt will decrease fascial restrictions from max to mod amounts to increase joint mobility of the LUE at the elbow and wrist.    Time 4   Period Weeks   Status On-going   OT SHORT  TERM GOAL #3   Title Pt will decrease pain in LUE to 3/10 or less to increase ability to perform HEP   Time 4   Period Weeks   Status On-going   OT SHORT TERM GOAL #4   Title Pt will increase left elbow P/ROM to [facility] to increase ability use LUE as assist during dressing tasks.    Time 4   Period Weeks   Status New   OT SHORT TERM GOAL #5   Title Pt will increase left wrist P/ROM to [facility] to increase ability to use LUE as assist during grooming tasks.    Time 4   Period Weeks   Status On-going   OT SHORT TERM GOAL #6   Title Pt will increase LUE strength to 3+/5 to increase ability to use LUE as assist when completing light housework tasks.    Time 8   Period Weeks   Status On-going           OT Long Term Goals - 09/03/15 1614    OT LONG TERM GOAL #1   Title Pt will return to prior level of functioning and independence during daily and leisure tasks.    Time 8   Period Weeks   Status On-going   OT LONG TERM GOAL #2   Title Pt will decrease fascial restrictions in LUE from mod to min amounts or less to increase mobility during functional use of LUE.    Time 8   Period Weeks   Status On-going   OT LONG TERM GOAL #3   Title Pt will decrease pain to 1/10 or less to increase ability to actively use  LUE during daily tasks.    Time 8   Period Weeks   Status On-going   OT LONG TERM GOAL #4   Title Pt will increase A/ROM of left elbow to [facility] to improve ability to wash and style hair independently.    Time 8   Period Weeks   Status On-going   OT LONG TERM GOAL #5   Title Pt will increase A/ROM of left wrist to [facility] to increase ability to functionally use LUE when preparing meals.    Time 8   Period Weeks   Status On-going   OT LONG TERM GOAL #6   Title Pt will increase LUE strength to 4/5 to increase ability to lift weighted objects such as pots and pans.   Time 8   Period Weeks   Status On-going               Plan - 09/13/15 1521    Clinical Impression Statement  A: Pt reports moderate soreness after last session, A/ROM HEP is going well, she also notes she is now able to  use left hand to assist in wringing out washcloth. Added elbow isometric exercises, therapy ball focusing on elbow flexion and extension as well as supination and shoulder flexion. Pt required occasional verbal cuing during A/ROM exercises for form. Provided pt with yellow theraputty to work on very gentle gripping.    Plan P: Cont with isometrics, attempt pinch tree working on elbow flexion and extension during reaching        Problem List Patient Active Problem List   Diagnosis Date Noted  . Humerus distal fracture 08/02/2015    Guadelupe Sabin, OTR/L  [phone]  09/13/2015, 3:24 PM  [place] [address] [place], [place], [address] Phone: [phone]   Fax:  [phone]  Name: Erica Mora MRN: [id number] Date of Birth: [birth year]

## 2015-09-16 ENCOUNTER — Ambulatory Visit (HOSPITAL_COMMUNITY): Payer: Medicare Other | Attending: Orthopedic Surgery | Admitting: Occupational Therapy

## 2015-09-16 ENCOUNTER — Encounter (HOSPITAL_COMMUNITY): Payer: Self-pay | Admitting: Occupational Therapy

## 2015-09-16 DIAGNOSIS — M25522 Pain in left elbow: Secondary | ICD-10-CM | POA: Diagnosis present

## 2015-09-16 DIAGNOSIS — M25532 Pain in left wrist: Secondary | ICD-10-CM

## 2015-09-16 DIAGNOSIS — M6281 Muscle weakness (generalized): Secondary | ICD-10-CM | POA: Diagnosis present

## 2015-09-16 DIAGNOSIS — M7989 Other specified soft tissue disorders: Secondary | ICD-10-CM

## 2015-09-16 DIAGNOSIS — R600 Localized edema: Secondary | ICD-10-CM | POA: Diagnosis present

## 2015-09-16 DIAGNOSIS — R29898 Other symptoms and signs involving the musculoskeletal system: Secondary | ICD-10-CM | POA: Diagnosis present

## 2015-09-16 DIAGNOSIS — M25622 Stiffness of left elbow, not elsewhere classified: Secondary | ICD-10-CM

## 2015-09-16 DIAGNOSIS — M25632 Stiffness of left wrist, not elsewhere classified: Secondary | ICD-10-CM

## 2015-09-16 NOTE — Therapy (Signed)
[place], [place], [address] Phone: [phone]   Fax:  [phone]  Occupational Therapy Treatment  Patient Details  Name: Erica Mora MRN: [id number] Date of Birth: 28-May-1941 Referring Provider: Altamese [place], MD  Encounter Date: 09/16/2015      OT End of Session - 09/16/15 1157    Visit Number 6   Number of Visits 24   Date for OT Re-Evaluation 10/29/15  mini reassessment 09/28/2015   Authorization Type Medicare/Medicare A& B primary; AARP secondary   Authorization Time Period Before 10th visit   Authorization - Visit Number 6   Authorization - Number of Visits 10   OT Start Time 1102   OT Stop Time 1150   OT Time Calculation (min) 48 min   Activity Tolerance Patient tolerated treatment well   Behavior During Therapy [facility] for tasks assessed/performed      Past Medical History  Diagnosis Date  . Medical history non-contributory     Past Surgical History  Procedure Laterality Date  . Vaginal delivery    . Tubal ligation    . Eye surgery      cataracts- bilateral    . Orif humerus fracture Left 08/02/2015    Procedure: OPEN REDUCTION INTERNAL FIXATION (ORIF)LEFT DISTAL RADIUS,HUMERUS FRACTURE;  Surgeon: Altamese [place], MD;  Location: [facility];  Service: Orthopedics;  Laterality: Left;    There were no vitals filed for this visit.  Visit Diagnosis:  Left upper extremity swelling  Pain in left elbow  Pain in left wrist  Stiffness of left elbow joint  Stiffness of left wrist joint  Muscle weakness of left upper extremity      Subjective Assessment - 09/16/15 1104    Subjective  S: My fingers are always stiff in the mornings.    Currently in Pain? No/denies            [facility] OT Assessment - 09/16/15 1152    Assessment   Diagnosis s/p ORIF-left distal radius fx & left distal humerus fx   Precautions   Precautions Other (comment)   Precaution Comments Gentle P/ROM, AA/ROM, A/ROM  progressing as tolerated, no resistance or strengthening for 8 weeks   Required Braces or Orthoses Other Brace/Splint   Other Brace/Splint wrist brace   Restrictions   Weight Bearing Restrictions Yes   LUE Weight Bearing Non weight bearing                  OT Treatments/Exercises (OP) - 09/16/15 1106    Exercises   Exercises Elbow;Wrist;Shoulder;Hand   Shoulder Exercises: Supine   External Rotation PROM;10 reps   Internal Rotation PROM;10 reps   Flexion PROM;10 reps   ABduction PROM;10 reps   Elbow Exercises   Elbow Extension PROM;Supine;AROM;Seated;10 reps   Forearm Supination PROM;Supine;AROM;Seated;10 reps   Forearm Pronation PROM;Supine;AROM;Seated;10 reps   Wrist Flexion PROM;Supine;AROM;Seated;10 reps   Wrist Extension PROM;Supine;AROM;Seated;10 reps   Other elbow exercises PROM supine; AROM seated; elbow flexion; 10X   Other elbow exercises isometric strengthening elbow flexion and extension, 3X5"   Weighted Stretch Over Towel Roll   Supination - Weighted Stretch 1 pound;Other (comment)  3X20"   Wrist Extension - Weighted Stretch 1 pound;Other (comment)  3X20"   Wrist Exercises   Wrist Radial Deviation PROM;Supine;AROM;Seated;10 reps   Wrist Ulnar Deviation PROM;Supine;AROM;Seated;10 reps   Manual Therapy   Manual Therapy Edema management;Myofascial release;Muscle Energy Technique   Manual therapy comments Manual therapy was completed prior to exercises.  Edema Management Edema management completed to left hand, wrist, and elbow region.    Myofascial Release Myofascial release and manual stretching to left hand/digits, wrist, elbow region to decrease fascial restrictions and increase joint mobility in a pain free zone.    Muscle Energy Technique Muscle energy technique to left elbow flexors to relax muscle spasms and increase joint mobility                  OT Short Term Goals - 09/03/15 1613    OT SHORT TERM GOAL #1   Title Pt will be provided  with and educated on HEP.    Time 4   Period Weeks   Status On-going   OT SHORT TERM GOAL #2   Title Pt will decrease fascial restrictions from max to mod amounts to increase joint mobility of the LUE at the elbow and wrist.    Time 4   Period Weeks   Status On-going   OT SHORT TERM GOAL #3   Title Pt will decrease pain in LUE to 3/10 or less to increase ability to perform HEP   Time 4   Period Weeks   Status On-going   OT SHORT TERM GOAL #4   Title Pt will increase left elbow P/ROM to [facility] to increase ability use LUE as assist during dressing tasks.    Time 4   Period Weeks   Status New   OT SHORT TERM GOAL #5   Title Pt will increase left wrist P/ROM to [facility] to increase ability to use LUE as assist during grooming tasks.    Time 4   Period Weeks   Status On-going   OT SHORT TERM GOAL #6   Title Pt will increase LUE strength to 3+/5 to increase ability to use LUE as assist when completing light housework tasks.    Time 8   Period Weeks   Status On-going           OT Long Term Goals - 09/03/15 1614    OT LONG TERM GOAL #1   Title Pt will return to prior level of functioning and independence during daily and leisure tasks.    Time 8   Period Weeks   Status On-going   OT LONG TERM GOAL #2   Title Pt will decrease fascial restrictions in LUE from mod to min amounts or less to increase mobility during functional use of LUE.    Time 8   Period Weeks   Status On-going   OT LONG TERM GOAL #3   Title Pt will decrease pain to 1/10 or less to increase ability to actively use  LUE during daily tasks.    Time 8   Period Weeks   Status On-going   OT LONG TERM GOAL #4   Title Pt will increase A/ROM of left elbow to [facility] to improve ability to wash and style hair independently.    Time 8   Period Weeks   Status On-going   OT LONG TERM GOAL #5   Title Pt will increase A/ROM of left wrist to [facility] to increase ability to functionally use LUE when preparing meals.    Time 8   Period  Weeks   Status On-going   OT LONG TERM GOAL #6   Title Pt will increase LUE strength to 4/5 to increase ability to lift weighted objects such as pots and pans.   Time 8   Period Weeks   Status On-going  Plan - 09/16/15 1157    Clinical Impression Statement A: Added weighted stretch wrist extension and supination, muscle energy technique, continued isometrics this session. Pt continues to slowly improve range during P/ROM, increased stiffness and edema limiting joint mobility. Minimal soreness noted at end of session. Did not complete pinch tree due to time constraints.    Plan P: attempt pinch tree for elbow flexion/extension, resume therapy ball exercises        Problem List Patient Active Problem List   Diagnosis Date Noted  . Humerus distal fracture 08/02/2015    Erica Mora, OTR/L  [phone]  09/16/2015, 1:05 PM  [place] [address] [place], [place], [address] Phone: [phone]   Fax:  [phone]  Name: Erica Mora MRN: [id number] Date of Birth: [birth year]

## 2015-09-18 ENCOUNTER — Encounter (HOSPITAL_COMMUNITY): Payer: Self-pay | Admitting: Occupational Therapy

## 2015-09-18 ENCOUNTER — Ambulatory Visit (HOSPITAL_COMMUNITY): Payer: Medicare Other | Admitting: Occupational Therapy

## 2015-09-18 DIAGNOSIS — M7989 Other specified soft tissue disorders: Secondary | ICD-10-CM

## 2015-09-18 DIAGNOSIS — M25532 Pain in left wrist: Secondary | ICD-10-CM

## 2015-09-18 DIAGNOSIS — M25522 Pain in left elbow: Secondary | ICD-10-CM

## 2015-09-18 DIAGNOSIS — M6281 Muscle weakness (generalized): Secondary | ICD-10-CM

## 2015-09-18 DIAGNOSIS — M25622 Stiffness of left elbow, not elsewhere classified: Secondary | ICD-10-CM

## 2015-09-18 DIAGNOSIS — M25632 Stiffness of left wrist, not elsewhere classified: Secondary | ICD-10-CM

## 2015-09-18 NOTE — Therapy (Signed)
[place], [place], [address] Phone: [phone]   Fax:  [phone]  Occupational Therapy Treatment  Patient Details  Name: Erica Mora MRN: [id number] Date of Birth: 1941-01-04 Referring Provider: Altamese [place], MD  Encounter Date: 09/18/2015      OT End of Session - 09/18/15 1634    Visit Number 7   Number of Visits 24   Date for OT Re-Evaluation 10/29/15  mini reassessment 09/28/2015   Authorization Type Medicare/Medicare A& B primary; AARP secondary   Authorization Time Period Before 10th visit   Authorization - Visit Number 7   Authorization - Number of Visits 10   OT Start Time 1348   OT Stop Time 1431   OT Time Calculation (min) 43 min   Activity Tolerance Patient tolerated treatment well   Behavior During Therapy [facility] for tasks assessed/performed      Past Medical History  Diagnosis Date  . Medical history non-contributory     Past Surgical History  Procedure Laterality Date  . Vaginal delivery    . Tubal ligation    . Eye surgery      cataracts- bilateral    . Orif humerus fracture Left 08/02/2015    Procedure: OPEN REDUCTION INTERNAL FIXATION (ORIF)LEFT DISTAL RADIUS,HUMERUS FRACTURE;  Surgeon: Altamese [place], MD;  Location: [facility];  Service: Orthopedics;  Laterality: Left;    There were no vitals filed for this visit.  Visit Diagnosis:  Left upper extremity swelling  Pain in left elbow  Pain in left wrist  Stiffness of left elbow joint  Stiffness of left wrist joint  Muscle weakness of left upper extremity      Subjective Assessment - 09/18/15 1630    Subjective  S: The doctor said my arm is not where he wants it to be.    Currently in Pain? No/denies            [facility] OT Assessment - 09/18/15 1353    Assessment   Diagnosis s/p ORIF-left distal radius fx & left distal humerus fx   Precautions   Precautions Other (comment)   Precaution Comments Gentle P/ROM, AA/ROM,  A/ROM progressing as tolerated, no resistance or strengthening for 8 weeks   Required Braces or Orthoses Other Brace/Splint   Other Brace/Splint wrist brace   Restrictions   Weight Bearing Restrictions Yes   LUE Weight Bearing Non weight bearing                  OT Treatments/Exercises (OP) - 09/18/15 1419    Exercises   Exercises Elbow;Wrist;Shoulder;Hand   Elbow Exercises   Elbow Extension PROM;10 reps   Forearm Supination PROM;10 reps   Forearm Pronation PROM;10 reps   Wrist Flexion PROM;10 reps   Wrist Extension PROM;10 reps   Other elbow exercises PROM supine elbow flexion; 10X   Other elbow exercises weighted elbow extension stretch, 2# 3X20"; elbow stretch at door handle 2X20", flexion stretch with elbows on wall while leaning in towards wall 3X10"   Weighted Stretch Over Towel Roll   Supination - Weighted Stretch 2 pounds;Other (comment)  3X20"   Wrist Extension - Weighted Stretch 2 pounds  3X20"   Manual Therapy   Manual Therapy Edema management;Myofascial release;Muscle Energy Technique   Manual therapy comments Manual therapy was completed prior to exercises.   Edema Management Edema management completed to left hand, wrist, and elbow region.    Myofascial Release Myofascial release and manual stretching to left hand/digits, wrist,  elbow region to decrease fascial restrictions and increase joint mobility in a pain free zone.                   OT Short Term Goals - 09/03/15 1613    OT SHORT TERM GOAL #1   Title Pt will be provided with and educated on HEP.    Time 4   Period Weeks   Status On-going   OT SHORT TERM GOAL #2   Title Pt will decrease fascial restrictions from max to mod amounts to increase joint mobility of the LUE at the elbow and wrist.    Time 4   Period Weeks   Status On-going   OT SHORT TERM GOAL #3   Title Pt will decrease pain in LUE to 3/10 or less to increase ability to perform HEP   Time 4   Period Weeks   Status  On-going   OT SHORT TERM GOAL #4   Title Pt will increase left elbow P/ROM to [facility] to increase ability use LUE as assist during dressing tasks.    Time 4   Period Weeks   Status New   OT SHORT TERM GOAL #5   Title Pt will increase left wrist P/ROM to [facility] to increase ability to use LUE as assist during grooming tasks.    Time 4   Period Weeks   Status On-going   OT SHORT TERM GOAL #6   Title Pt will increase LUE strength to 3+/5 to increase ability to use LUE as assist when completing light housework tasks.    Time 8   Period Weeks   Status On-going           OT Long Term Goals - 09/03/15 1614    OT LONG TERM GOAL #1   Title Pt will return to prior level of functioning and independence during daily and leisure tasks.    Time 8   Period Weeks   Status On-going   OT LONG TERM GOAL #2   Title Pt will decrease fascial restrictions in LUE from mod to min amounts or less to increase mobility during functional use of LUE.    Time 8   Period Weeks   Status On-going   OT LONG TERM GOAL #3   Title Pt will decrease pain to 1/10 or less to increase ability to actively use  LUE during daily tasks.    Time 8   Period Weeks   Status On-going   OT LONG TERM GOAL #4   Title Pt will increase A/ROM of left elbow to [facility] to improve ability to wash and style hair independently.    Time 8   Period Weeks   Status On-going   OT LONG TERM GOAL #5   Title Pt will increase A/ROM of left wrist to [facility] to increase ability to functionally use LUE when preparing meals.    Time 8   Period Weeks   Status On-going   OT LONG TERM GOAL #6   Title Pt will increase LUE strength to 4/5 to increase ability to lift weighted objects such as pots and pans.   Time 8   Period Weeks   Status On-going               Plan - 09/18/15 1634    Clinical Impression Statement A: Pt reports she went to MD and he believed she would be further along now. MD sent new order for continued P/ROM, AA/ROM, A/ROM with  agressive stretching for elbow flexion/extension, supination, and pronation. Measurements taken for JAS brace this session. Added weighted extension stretch and wall flexion stretch this session.    Plan P: Continue with aggressive stretching for elbow extension/flexion supination & pronation, resume missed exercises, cont weighted stretches        Problem List Patient Active Problem List   Diagnosis Date Noted  . Humerus distal fracture 08/02/2015    Guadelupe Sabin, OTR/L  [phone]  09/18/2015, 4:40 PM  [place], [place], [address] Phone: [phone]   Fax:  [phone]  Name: Erica Mora MRN: [id number] Date of Birth: December 10, 1940

## 2015-09-20 ENCOUNTER — Ambulatory Visit (HOSPITAL_COMMUNITY): Payer: Medicare Other

## 2015-09-20 ENCOUNTER — Encounter (HOSPITAL_COMMUNITY): Payer: Self-pay

## 2015-09-20 DIAGNOSIS — M25532 Pain in left wrist: Secondary | ICD-10-CM

## 2015-09-20 DIAGNOSIS — M25622 Stiffness of left elbow, not elsewhere classified: Secondary | ICD-10-CM

## 2015-09-20 DIAGNOSIS — M6281 Muscle weakness (generalized): Secondary | ICD-10-CM

## 2015-09-20 DIAGNOSIS — M7989 Other specified soft tissue disorders: Secondary | ICD-10-CM | POA: Diagnosis not present

## 2015-09-20 DIAGNOSIS — R600 Localized edema: Secondary | ICD-10-CM

## 2015-09-20 DIAGNOSIS — M25632 Stiffness of left wrist, not elsewhere classified: Secondary | ICD-10-CM

## 2015-09-20 DIAGNOSIS — M25522 Pain in left elbow: Secondary | ICD-10-CM

## 2015-09-20 NOTE — Patient Instructions (Signed)
Scar Tissue Massage    Place pad of fingertip on scar area. Apply steady downward pressure while moving in circular fashion. Use another finger on top to assist. Pinch, squeeze, and otherwise mobilize the scar as tolerated. This should not be painful. Repeat until entire scar has been covered. Spend 5-10 minutes massaging scar. Do 2-3 sessions per day.  Copyright  VHI. All rights reserved.

## 2015-09-20 NOTE — Therapy (Signed)
[place], [place], [address] Phone: [phone]   Fax:  [phone]  Occupational Therapy Treatment  Patient Details  Name: Erica Mora MRN: [id number] Date of Birth: 1941/01/21 Referring Provider: Altamese Senath, MD  Encounter Date: 09/20/2015      OT End of Session - 09/20/15 1352    Visit Number 8   Number of Visits 24   Date for OT Re-Evaluation 10/29/15  mini reassessment 09/28/2015   Authorization Type Medicare/Medicare A& B primary; [employer] secondary   Authorization Time Period Before 10th visit   Authorization - Visit Number 8   Authorization - Number of Visits 10   OT Start Time [id number]   OT Stop Time 1350   OT Time Calculation (min) 45 min   Activity Tolerance Patient tolerated treatment well   Behavior During Therapy [facility] for tasks assessed/performed      Past Medical History  Diagnosis Date  . Medical history non-contributory     Past Surgical History  Procedure Laterality Date  . Vaginal delivery    . Tubal ligation    . Eye surgery      cataracts- bilateral    . Orif humerus fracture Left 08/02/2015    Procedure: OPEN REDUCTION INTERNAL FIXATION (ORIF)LEFT DISTAL RADIUS,HUMERUS FRACTURE;  Surgeon: Altamese N[place]ung[place], MD;  Location: [facility];  Service: Orthopedics;  Laterality: Left;    There were no vitals filed for this visit.      Subjective Assessment - 09/20/15 1306    Subjective  S: I just want to be able to use my arm. It's not bending, it's not doing what I need it to.   Currently in Pain? No/denies            [facility] OT Assessment - 09/20/15 1309    Assessment   Diagnosis s/p ORIF-left distal radius fx & left distal humerus fx   Precautions   Precautions Other (comment)   Precaution Comments Gentle P/ROM, AA/ROM, A/ROM progressing as tolerated, no resistance or strengthening for 8 weeks   Required Braces or Orthoses Other Brace/Splint   Other Brace/Splint wrist brace                   OT Treatments/Exercises (OP) - 09/20/15 1309    Exercises   Exercises Elbow;Wrist;Shoulder;Hand   Elbow Exercises   Elbow Extension PROM;AROM;10 reps   Forearm Supination PROM;AROM;10 reps   Forearm Pronation PROM;AROM;10 reps   Wrist Flexion PROM;AROM;10 reps   Wrist Extension PROM;AROM;10 reps   Other elbow exercises PROM supine elbow flexion; 10X   Other elbow exercises weighted elbow extension stretch, 2# 3X20"; elbow extension stretch using mat table hold for 10 sec repeat 3X   Manual Therapy   Manual Therapy Edema management;Myofascial release;Muscle Energy Technique   Manual therapy comments Manual therapy was completed prior to exercises.   Edema Management Edema management completed to left hand, wrist, and elbow region.    Myofascial Release Myofascial release and manual stretching to left hand/digits, wrist, elbow region to decrease fascial restrictions and increase joint mobility in a pain free zone.    Muscle Energy Technique Muscle energy technique to left elbow flexors and extensors and wrist flexors, extensors, supinators, and pronators to relax muscle spasms and increase joint mobility                OT Education - 09/20/15 1329    Education provided Yes   Education Details Patient was instructed in scar massage and  provided with a handout.   Person(s) Educated Patient   Methods Explanation;Demonstration;Handout   Comprehension Verbalized understanding          OT Short Term Goals - 09/03/15 1613    OT SHORT TERM GOAL #1   Title Pt will be provided with and educated on HEP.    Time 4   Period Weeks   Status On-going   OT SHORT TERM GOAL #2   Title Pt will decrease fascial restrictions from max to mod amounts to increase joint mobility of the LUE at the elbow and wrist.    Time 4   Period Weeks   Status On-going   OT SHORT TERM GOAL #3   Title Pt will decrease pain in LUE to 3/10 or less to increase ability to perform HEP    Time 4   Period Weeks   Status On-going   OT SHORT TERM GOAL #4   Title Pt will increase left elbow P/ROM to [facility] to increase ability use LUE as assist during dressing tasks.    Time 4   Period Weeks   Status New   OT SHORT TERM GOAL #5   Title Pt will increase left wrist P/ROM to [facility] to increase ability to use LUE as assist during grooming tasks.    Time 4   Period Weeks   Status On-going   OT SHORT TERM GOAL #6   Title Pt will increase LUE strength to 3+/5 to increase ability to use LUE as assist when completing light housework tasks.    Time 8   Period Weeks   Status On-going           OT Long Term Goals - 09/03/15 1614    OT LONG TERM GOAL #1   Title Pt will return to prior level of functioning and independence during daily and leisure tasks.    Time 8   Period Weeks   Status On-going   OT LONG TERM GOAL #2   Title Pt will decrease fascial restrictions in LUE from mod to min amounts or less to increase mobility during functional use of LUE.    Time 8   Period Weeks   Status On-going   OT LONG TERM GOAL #3   Title Pt will decrease pain to 1/10 or less to increase ability to actively use  LUE during daily tasks.    Time 8   Period Weeks   Status On-going   OT LONG TERM GOAL #4   Title Pt will increase A/ROM of left elbow to [facility] to improve ability to wash and style hair independently.    Time 8   Period Weeks   Status On-going   OT LONG TERM GOAL #5   Title Pt will increase A/ROM of left wrist to [facility] to increase ability to functionally use LUE when preparing meals.    Time 8   Period Weeks   Status On-going   OT LONG TERM GOAL #6   Title Pt will increase LUE strength to 4/5 to increase ability to lift weighted objects such as pots and pans.   Time 8   Period Weeks   Status On-going               Plan - 09/20/15 1352    Clinical Impression Statement P: Therapist performs agressive stretching to elbow and wrist. After stretching, patient is able to  achieve 90 degrees of supination and pronation in P/ROM.   Plan P: Heat on wrist  and elbow prior to stretching. Continue with aggressive stretching and weighted stretches.      Patient will benefit from skilled therapeutic intervention in order to improve the following deficits and impairments:     Visit Diagnosis: Muscle weakness (generalized)  Localized edema  Stiffness of left elbow joint  Stiffness of left wrist joint  Pain in left wrist  Pain in left elbow    Problem List Patient Active Problem List   Diagnosis Date Noted  . Humerus distal fracture 08/02/2015    Ailene Ravel, OTR/L,CBIS  [phone]  09/20/2015, 2:18 PM  [place], [place], [address] Phone: [phone]   Fax:  [phone]  Name: Erica Mora MRN: [id number] Date of Birth: [birth year]

## 2015-09-23 ENCOUNTER — Ambulatory Visit (HOSPITAL_COMMUNITY): Payer: Medicare Other

## 2015-09-23 ENCOUNTER — Encounter (HOSPITAL_COMMUNITY): Payer: Self-pay

## 2015-09-23 DIAGNOSIS — M7989 Other specified soft tissue disorders: Secondary | ICD-10-CM | POA: Diagnosis not present

## 2015-09-23 DIAGNOSIS — M25632 Stiffness of left wrist, not elsewhere classified: Secondary | ICD-10-CM

## 2015-09-23 DIAGNOSIS — M25532 Pain in left wrist: Secondary | ICD-10-CM

## 2015-09-23 DIAGNOSIS — R29898 Other symptoms and signs involving the musculoskeletal system: Secondary | ICD-10-CM

## 2015-09-23 DIAGNOSIS — M25622 Stiffness of left elbow, not elsewhere classified: Secondary | ICD-10-CM

## 2015-09-23 DIAGNOSIS — M25522 Pain in left elbow: Secondary | ICD-10-CM

## 2015-09-23 NOTE — Therapy (Signed)
[place], [place], [address] Phone: [phone]   Fax:  [phone]  Occupational Therapy Treatment  Patient Details  Name: Erica Mora MRN: [id number] Date of Birth: 1940-10-18 Referring Provider: Altamese [place], MD  Encounter Date: 09/23/2015      OT End of Session - 09/23/15 1356    Visit Number 9   Number of Visits 24   Date for OT Re-Evaluation 10/29/15  mini reassessment 09/28/2015   Authorization Type Medicare/Medicare A& B primary; [employer] secondary   Authorization Time Period Before 10th visit   Authorization - Visit Number 9   Authorization - Number of Visits 10   OT Start Time 1300   OT Stop Time 1348   OT Time Calculation (min) 48 min   Activity Tolerance Patient tolerated treatment well   Behavior During Therapy [facility] for tasks assessed/performed      Past Medical History  Diagnosis Date  . Medical history non-contributory     Past Surgical History  Procedure Laterality Date  . Vaginal delivery    . Tubal ligation    . Eye surgery      cataracts- bilateral    . Orif humerus fracture Left 08/02/2015    Procedure: OPEN REDUCTION INTERNAL FIXATION (ORIF)LEFT DISTAL RADIUS,HUMERUS FRACTURE;  Surgeon: Altamese [place], MD;  Location: [place];  Service: Orthopedics;  Laterality: Left;    There were no vitals filed for this visit.      Subjective Assessment - 09/23/15 1311    Subjective  S: I have tried to use warm heat at home to try and loosen it up.    Currently in Pain? No/denies            [facility] OT Assessment - 09/23/15 1312    Assessment   Diagnosis s/p ORIF-left distal radius fx & left distal humerus fx   Precautions   Precautions Other (comment)   Precaution Comments Gentle P/ROM, AA/ROM, A/ROM progressing as tolerated, no resistance or strengthening for 8 weeks   Restrictions   Weight Bearing Restrictions Yes   LUE Weight Bearing Non weight bearing                  OT Treatments/Exercises (OP) - 09/23/15 1313    Exercises   Exercises Elbow;Wrist;Shoulder;Hand   Elbow Exercises   Elbow Extension PROM;AROM;10 reps   Forearm Supination PROM;AROM;10 reps   Forearm Pronation PROM;AROM;10 reps   Wrist Flexion PROM;AROM;10 reps   Wrist Extension PROM;AROM;10 reps   Other elbow exercises PROM supine elbow flexion; 10X   Other elbow exercises weighted elbow extension stretch, 2# 3X20"; elbow extension stretch using mat table hold for 10 sec repeat 3X   Modalities   Modalities Moist Heat   Moist Heat Therapy   Number Minutes Moist Heat 10 Minutes   Moist Heat Location Elbow;Wrist;Hand   Manual Therapy   Manual Therapy Myofascial release;Muscle Energy Technique   Manual therapy comments Manual therapy was completed prior to exercises.   Myofascial Release Myofascial release and manual stretching to left hand/digits, wrist, elbow region to decrease fascial restrictions and increase joint mobility in a pain free zone.    Muscle Energy Technique Muscle energy technique to left elbow flexors and extensors and wrist flexors, extensors, supinators, and pronators to relax muscle spasms and increase joint mobility                  OT Short Term Goals - 09/23/15 1400    OT SHORT  TERM GOAL #1   Title Pt will be provided with and educated on HEP.    Time 4   Period Weeks   Status On-going   OT SHORT TERM GOAL #2   Title Pt will decrease fascial restrictions from max to mod amounts to increase joint mobility of the LUE at the elbow and wrist.    Time 4   Period Weeks   Status On-going   OT SHORT TERM GOAL #3   Title Pt will decrease pain in LUE to 3/10 or less to increase ability to perform HEP   Time 4   Period Weeks   Status On-going   OT SHORT TERM GOAL #4   Title Pt will increase left elbow P/ROM to [facility] to increase ability use LUE as assist during dressing tasks.    Time 4   Period Weeks   Status On-going   OT SHORT TERM GOAL #5   Title  Pt will increase left wrist P/ROM to [facility] to increase ability to use LUE as assist during grooming tasks.    Time 4   Period Weeks   Status On-going   OT SHORT TERM GOAL #6   Title Pt will increase LUE strength to 3+/5 to increase ability to use LUE as assist when completing light housework tasks.    Time 8   Period Weeks   Status On-going           OT Long Term Goals - 09/03/15 1614    OT LONG TERM GOAL #1   Title Pt will return to prior level of functioning and independence during daily and leisure tasks.    Time 8   Period Weeks   Status On-going   OT LONG TERM GOAL #2   Title Pt will decrease fascial restrictions in LUE from mod to min amounts or less to increase mobility during functional use of LUE.    Time 8   Period Weeks   Status On-going   OT LONG TERM GOAL #3   Title Pt will decrease pain to 1/10 or less to increase ability to actively use  LUE during daily tasks.    Time 8   Period Weeks   Status On-going   OT LONG TERM GOAL #4   Title Pt will increase A/ROM of left elbow to [facility] to improve ability to wash and style hair independently.    Time 8   Period Weeks   Status On-going   OT LONG TERM GOAL #5   Title Pt will increase A/ROM of left wrist to [facility] to increase ability to functionally use LUE when preparing meals.    Time 8   Period Weeks   Status On-going   OT LONG TERM GOAL #6   Title Pt will increase LUE strength to 4/5 to increase ability to lift weighted objects such as pots and pans.   Time 8   Period Weeks   Status On-going               Plan - 09/23/15 1358    Clinical Impression Statement A: Incorporated moist heat at beginning of session to aid in manual stretching. Therapist noticed slight increase in P/ROM in wrist with the muscle energy technique.    Plan P: Update G code. Continue with heat on elbow and wrist prior to stretching. Continue with aggressive stretching and weighted stretches.      Patient will benefit from skilled  therapeutic intervention in order to improve the following deficits  and impairments:  Decreased strength, Impaired sensation, Pain, Increased edema, Impaired UE functional use, Decreased range of motion, Increased fascial restricitons, Decreased skin integrity, Impaired flexibility, Decreased scar mobility  Visit Diagnosis: Other symptoms and signs involving the musculoskeletal system  Stiffness of left wrist, not elsewhere classified  Stiffness of left elbow, not elsewhere classified  Pain in left elbow  Pain in left wrist    Problem List Patient Active Problem List   Diagnosis Date Noted  . Humerus distal fracture 08/02/2015    Ailene Ravel, OTR/L,CBIS  [phone]  09/23/2015, 2:01 PM  [place] [address] [place], [place], [address] Phone: [phone]   Fax:  [phone]  Name: Erica Mora MRN: [id number] Date of Birth: 19-Jun-1940

## 2015-09-25 ENCOUNTER — Ambulatory Visit (HOSPITAL_COMMUNITY): Payer: Medicare Other | Admitting: Occupational Therapy

## 2015-09-25 ENCOUNTER — Encounter (HOSPITAL_COMMUNITY): Payer: Self-pay | Admitting: Occupational Therapy

## 2015-09-25 DIAGNOSIS — M6281 Muscle weakness (generalized): Secondary | ICD-10-CM

## 2015-09-25 DIAGNOSIS — M7989 Other specified soft tissue disorders: Secondary | ICD-10-CM | POA: Diagnosis not present

## 2015-09-25 DIAGNOSIS — R29898 Other symptoms and signs involving the musculoskeletal system: Secondary | ICD-10-CM

## 2015-09-25 DIAGNOSIS — M25522 Pain in left elbow: Secondary | ICD-10-CM

## 2015-09-25 DIAGNOSIS — M25632 Stiffness of left wrist, not elsewhere classified: Secondary | ICD-10-CM

## 2015-09-25 DIAGNOSIS — M25532 Pain in left wrist: Secondary | ICD-10-CM

## 2015-09-25 DIAGNOSIS — M25622 Stiffness of left elbow, not elsewhere classified: Secondary | ICD-10-CM

## 2015-09-25 NOTE — Therapy (Signed)
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 Medical history non-contributory     Past Surgical History  Procedure Laterality Date  . Vaginal delivery    . Tubal ligation    . Eye surgery      cataracts- bilateral    . Orif humerus fracture Left 08/02/2015    Procedure: OPEN REDUCTION INTERNAL FIXATION (ORIF)LEFT DISTAL RADIUS,HUMERUS FRACTURE;  Surgeon: Altamese [place], MD;  Location: [place];  Service: Orthopedics;  Laterality: Left;    There were no vitals filed for this visit.      Subjective Assessment - 09/25/15 1313    Subjective  S: I can touch my nose with my thumb now.    Special Tests FOTO Score: 48/100 (52% impairment)   Currently in Pain? No/denies                      OT Treatments/Exercises (OP) - 09/25/15 1314    Exercises   Exercises Elbow;Wrist;Shoulder;Hand   Shoulder Exercises: Therapy Ball   Other Therapy Ball Exercises Walking therapy ball up the wall 3X focusing on elbow flexion/extension, and wrist flexion   Elbow Exercises   Elbow Extension PROM;AROM;10 reps   Forearm Supination PROM;AROM;10 reps    Forearm Pronation PROM;AROM;10 reps   Wrist Flexion PROM;AROM;10 reps   Wrist Extension PROM;AROM;10 reps   Other elbow exercises PROM supine elbow flexion; 10X   Other elbow exercises weighted elbow extension stretch, 2# 3X20"; elbow extension stretch using mat table hold for 10 sec repeat 3X; flexion stretch-leaning with elbows on wall 3X20"   Weighted Stretch Over Towel Roll   Supination - Weighted Stretch 2 pounds;Other (comment)  3X20"   Wrist Extension - Weighted Stretch 2 pounds  3X20"   Modalities   Modalities Moist Heat   Moist Heat Therapy   Number Minutes Moist Heat 10 Minutes   Moist Heat Location Elbow;Wrist;Hand   Manual Therapy   Manual Therapy Myofascial release;Muscle Energy Technique   Manual therapy comments Manual therapy was completed prior to exercises.   Myofascial Release Myofascial release and manual stretching to left hand/digits, wrist, elbow region to decrease fascial restrictions and increase joint mobility in a pain free zone.    Muscle Energy Technique Muscle energy technique to left elbow flexors and extensors and wrist flexors, extensors, supinators, and pronators to relax muscle spasms and increase joint mobility                  OT Short Term Goals - 09/23/15 1400    OT SHORT TERM GOAL #1   Title Pt  will be provided with and educated on HEP.    Time 4   Period Weeks   Status On-going   OT SHORT TERM GOAL #2   Title Pt will decrease fascial restrictions from max to mod amounts to increase joint mobility of the LUE at the elbow and wrist.    Time 4   Period Weeks   Status On-going   OT SHORT TERM GOAL #3   Title Pt will decrease pain in LUE to 3/10 or less to increase ability to perform HEP   Time 4   Period Weeks   Status On-going   OT SHORT TERM GOAL #4   Title Pt will increase left elbow P/ROM to [facility] to increase ability use LUE as assist during dressing tasks.    Time 4   Period Weeks   Status On-going   OT SHORT TERM GOAL  #5   Title Pt will increase left wrist P/ROM to [facility] to increase ability to use LUE as assist during grooming tasks.    Time 4   Period Weeks   Status On-going   OT SHORT TERM GOAL #6   Title Pt will increase LUE strength to 3+/5 to increase ability to use LUE as assist when completing light housework tasks.    Time 8   Period Weeks   Status On-going           OT Long Term Goals - 09/03/15 1614    OT LONG TERM GOAL #1   Title Pt will return to prior level of functioning and independence during daily and leisure tasks.    Time 8   Period Weeks   Status On-going   OT LONG TERM GOAL #2   Title Pt will decrease fascial restrictions in LUE from mod to min amounts or less to increase mobility during functional use of LUE.    Time 8   Period Weeks   Status On-going   OT LONG TERM GOAL #3   Title Pt will decrease pain to 1/10 or less to increase ability to actively use  LUE during daily tasks.    Time 8   Period Weeks   Status On-going   OT LONG TERM GOAL #4   Title Pt will increase A/ROM of left elbow to [facility] to improve ability to wash and style hair independently.    Time 8   Period Weeks   Status On-going   OT LONG TERM GOAL #5   Title Pt will increase A/ROM of left wrist to [facility] to increase ability to functionally use LUE when preparing meals.    Time 8   Period Weeks   Status On-going   OT LONG TERM GOAL #6   Title Pt will increase LUE strength to 4/5 to increase ability to lift weighted objects such as pots and pans.   Time 8   Period Weeks   Status On-going               Plan - 09/25/15 1611    Clinical Impression Statement A: Continued moist heat at beginning of session to increase range of motion during manual stretching. Pt reports & displays "indentions" along lateral forearm consistent with pitting edema, pt reports she soaked her arm in hot water prior to therapy and could have rested arm on an uneven surface. Pitting slowly resolving during session.     Rehab Potential Good   OT Frequency 3x / week   OT Duration 8 weeks   OT Treatment/Interventions  Self-care/ADL training;Ultrasound;DME and/or AE instruction;Scar mobilization;Passive range of motion;Patient/family education;Cryotherapy;Electrical Stimulation;Splinting;Moist Heat;Therapeutic exercise;Manual Therapy;Therapeutic activities   Plan P: Continue with aggressive stretching, increase weighted extension stretch to 30" intervals.    Consulted and Agree with Plan of Care Patient      Patient will benefit from skilled therapeutic intervention in order to improve the following deficits and impairments:  Decreased strength, Impaired sensation, Pain, Increased edema, Impaired UE functional use, Decreased range of motion, Increased fascial restricitons, Decreased skin integrity, Impaired flexibility, Decreased scar mobility  Visit Diagnosis: Other symptoms and signs involving the musculoskeletal system  Stiffness of left wrist, not elsewhere classified  Stiffness of left elbow, not elsewhere classified  Pain in left elbow  Pain in left wrist  Muscle weakness (generalized)      G-Codes - 2015/10/13 1616    Functional Assessment Tool Used FOTO Score: 48/100 (52% impairment)   Functional Limitation Carrying, moving and handling objects   Carrying, Moving and Handling Objects Current Status [id number]) At least 40 percent but less than 60 percent impaired, limited or restricted   Carrying, Moving and Handling Objects Goal Status [id number]) At least 20 percent but less than 40 percent impaired, limited or restricted      Problem List Patient Active Problem List   Diagnosis Date Noted  . Humerus distal fracture 08/02/2015    Guadelupe Sabin, OTR/L  [phone]  13-Oct-2015, 4:17 PM  [place] [address] [place], [place], [address] Phone: [phone]   Fax:  [phone]  Name: Erica Mora MRN: [id number] Date of Birth:  05-11-1941

## 2015-09-27 ENCOUNTER — Ambulatory Visit (HOSPITAL_COMMUNITY): Payer: Medicare Other | Admitting: Occupational Therapy

## 2015-09-27 ENCOUNTER — Encounter (HOSPITAL_COMMUNITY): Payer: Self-pay | Admitting: Occupational Therapy

## 2015-09-27 DIAGNOSIS — R29898 Other symptoms and signs involving the musculoskeletal system: Secondary | ICD-10-CM

## 2015-09-27 DIAGNOSIS — M25522 Pain in left elbow: Secondary | ICD-10-CM

## 2015-09-27 DIAGNOSIS — M25532 Pain in left wrist: Secondary | ICD-10-CM

## 2015-09-27 DIAGNOSIS — M25622 Stiffness of left elbow, not elsewhere classified: Secondary | ICD-10-CM

## 2015-09-27 DIAGNOSIS — M25632 Stiffness of left wrist, not elsewhere classified: Secondary | ICD-10-CM

## 2015-09-27 DIAGNOSIS — M7989 Other specified soft tissue disorders: Secondary | ICD-10-CM | POA: Diagnosis not present

## 2015-09-27 DIAGNOSIS — M6281 Muscle weakness (generalized): Secondary | ICD-10-CM

## 2015-09-27 NOTE — Therapy (Addendum)
[place], [place], [address] Phone: [phone]   Fax:  [phone]  Occupational Therapy Reassessment & Treatment  Patient Details  Name: Erica Mora MRN: [id number] Date of Birth: 06-12-1941 Referring Provider: Altamese [place], MD  Encounter Date: 09/27/2015      OT End of Session - 09/27/15 1402    Visit Number 11   Number of Visits 24   Date for OT Re-Evaluation 10/29/15   Authorization Type Medicare/Medicare A& B primary; AARP secondary   Authorization Time Period Before 10th visit   Authorization - Visit Number 11   Authorization - Number of Visits 20   OT Start Time 1303   OT Stop Time 1345   OT Time Calculation (min) 42 min   Activity Tolerance Patient tolerated treatment well   Behavior During Therapy [facility] for tasks assessed/performed      Past Medical History  Diagnosis Date  . Medical history non-contributory     Past Surgical History  Procedure Laterality Date  . Vaginal delivery    . Tubal ligation    . Eye surgery      cataracts- bilateral    . Orif humerus fracture Left 08/02/2015    Procedure: OPEN REDUCTION INTERNAL FIXATION (ORIF)LEFT DISTAL RADIUS,HUMERUS FRACTURE;  Surgeon: Altamese [place], MD;  Location: [facility];  Service: Orthopedics;  Laterality: Left;    There were no vitals filed for this visit.      Subjective Assessment - 09/27/15 1359    Subjective  S: I can reach up to take my clothes off easier now.    Currently in Pain? No/denies           [facility] OT Assessment - 09/27/15 1324    Assessment   Diagnosis s/p ORIF-left distal radius fx & left distal humerus fx   Precautions   Precautions Other (comment)   Precaution Comments Gentle P/ROM, AA/ROM, A/ROM progressing as tolerated, no resistance or strengthening for 8 weeks   Restrictions   Weight Bearing Restrictions Yes   LUE Weight Bearing Non weight bearing   Edema   Edema Pt with increased edema along left  forearm and elbow regions. left mid forearm 17cm, elbow crease 21cm, 2" above elbow 21.5cm   Palpation   Palpation comment Pt with mod/max fascial restrictions along left wrist, dorsal and volar forearm, elbow, and upper arm regions   AROM   Overall AROM Comments Assessed seated, ER/IR adducted   AROM Assessment Site Elbow;Forearm;Wrist   Right/Left Shoulder Left   Left Elbow Flexion 100  previous 82   Left Elbow Extension 50  previous 56   Right/Left Forearm Left   Left Forearm Pronation 72 Degrees  previous 32   Left Forearm Supination 80 Degrees  previous 72   Right/Left Wrist Left   Left Wrist Extension 30 Degrees  previous 15   Left Wrist Flexion 32 Degrees  previous 18   Left Wrist Radial Deviation 15 Degrees  previous 10   Left Wrist Ulnar Deviation 20 Degrees  same as previous   PROM   Overall PROM Comments Assessed seated   PROM Assessment Site Elbow;Forearm;Wrist   Right/Left Shoulder Left   Right/Left Elbow Left   Left Elbow Flexion 112  84 previous   Left Elbow Extension 44  44 previous   Right/Left Forearm Left   Left Forearm Pronation 90 Degrees  18 previous   Left Forearm Supination 90 Degrees  88 previous   Right/Left Wrist Left   Left  Wrist Extension 44 Degrees  29 previous   Left Wrist Flexion 45 Degrees  25 previous   Left Wrist Radial Deviation 15 Degrees  10 previous   Left Wrist Ulnar Deviation 20 Degrees  same as previous   Strength   Strength Assessment Site Elbow;Forearm;Wrist   Right/Left Elbow Left   Left Elbow Flexion 3-/5   Left Elbow Extension 3-/5   Right/Left Forearm Left   Left Forearm Pronation 3/5   Left Forearm Supination 3/5   Right/Left Wrist Left   Left Wrist Flexion 3+/5   Left Wrist Extension 3+/5   Left Wrist Radial Deviation 3/5   Left Wrist Ulnar Deviation 3/5                  OT Treatments/Exercises (OP) - 09/27/15 1338    Exercises   Exercises Elbow;Wrist;Shoulder;Hand   Elbow Exercises   Elbow  Extension PROM;10 reps   Forearm Supination PROM;10 reps   Forearm Pronation PROM;10 reps   Wrist Flexion PROM;10 reps   Wrist Extension PROM;10 reps   Other elbow exercises PROM supine elbow flexion; 10X   Other elbow exercises weighted elbow extension stretch, 2# 3X30"; elbow extension stretch using mat table hold for 3X30"; flexion stretch-leaning with elbows on mat table 2X30"   Modalities   Modalities Moist Heat   Moist Heat Therapy   Number Minutes Moist Heat 10 Minutes   Moist Heat Location Elbow;Wrist;Hand   Manual Therapy   Manual Therapy Myofascial release;Muscle Energy Technique   Manual therapy comments Manual therapy was completed prior to exercises.   Myofascial Release Myofascial release and manual stretching to left hand/digits, wrist, elbow region to decrease fascial restrictions and increase joint mobility in a pain free zone.    Muscle Energy Technique Muscle energy technique to left elbow flexors and extensors and wrist flexors, extensors, supinators, and pronators to relax muscle spasms and increase joint mobility                 OT Short Term Goals - 09/27/15 1402    OT SHORT TERM GOAL #1   Title Pt will be provided with and educated on HEP.    Time 4   Period Weeks   Status On-going   OT SHORT TERM GOAL #2   Title Pt will decrease fascial restrictions from max to mod amounts to increase joint mobility of the LUE at the elbow and wrist.    Time 4   Period Weeks   Status Partially Met   OT SHORT TERM GOAL #3   Title Pt will decrease pain in LUE to 3/10 or less to increase ability to perform HEP   Time 4   Period Weeks   Status Achieved   OT SHORT TERM GOAL #4   Title Pt will increase left elbow P/ROM to [facility] to increase ability use LUE as assist during dressing tasks.    Time 4   Period Weeks   Status On-going   OT SHORT TERM GOAL #5   Title Pt will increase left wrist P/ROM to [facility] to increase ability to use LUE as assist during grooming tasks.     Time 4   Period Weeks   Status Partially Met   OT SHORT TERM GOAL #6   Title Pt will increase LUE strength to 3+/5 to increase ability to use LUE as assist when completing light housework tasks.    Time 4   Period Weeks   Status Achieved  OT Long Term Goals - 09/27/15 1403    OT LONG TERM GOAL #1   Title Pt will return to prior level of functioning and independence during daily and leisure tasks.    Time 8   Period Weeks   Status On-going   OT LONG TERM GOAL #2   Title Pt will decrease fascial restrictions in LUE from mod to min amounts or less to increase mobility during functional use of LUE.    Time 8   Period Weeks   Status On-going   OT LONG TERM GOAL #3   Title Pt will decrease pain to 1/10 or less to increase ability to actively use  LUE during daily tasks.    Time 8   Period Weeks   Status On-going   OT LONG TERM GOAL #4   Title Pt will increase A/ROM of left elbow to [facility] to improve ability to wash and style hair independently.    Time 8   Period Weeks   Status On-going   OT LONG TERM GOAL #5   Title Pt will increase A/ROM of left wrist to [facility] to increase ability to functionally use LUE when preparing meals.    Time 8   Period Weeks   Status On-going   OT LONG TERM GOAL #6   Title Pt will increase LUE strength to 4/5 to increase ability to lift weighted objects such as pots and pans.   Time 8   Period Weeks   Status On-going               Plan - 09/27/15 1403    Clinical Impression Statement A: Reassessment completed this session. Pt has met 1/6 STGs and partially met 2/5 STGs, and is slowly progressing towards remaining goals. Pt reports she has noticed improvements when dressing, reaching across the body, and when applying lotion. Pt continues to demonstrate deficits in range of motion and strength of left wrist, forearm, and elbow.    Rehab Potential Good   OT Frequency 3x / week   OT Duration 8 weeks   OT Treatment/Interventions  Self-care/ADL training;Ultrasound;DME and/or AE instruction;Scar mobilization;Passive range of motion;Patient/family education;Cryotherapy;Electrical Stimulation;Splinting;Moist Heat;Therapeutic exercise;Manual Therapy;Therapeutic activities   Plan P: Continue OT sessions with aggressive stretching to LUE focusing on increasing range of motion of wrist, forearm, and elbow to [facility], increasing strength, and decreasing fascial restrictions to increase functional use of the LUE. Update HEP as necessary.    Consulted and Agree with Plan of Care Patient      Patient will benefit from skilled therapeutic intervention in order to improve the following deficits and impairments:  Decreased strength, Impaired sensation, Pain, Increased edema, Impaired UE functional use, Decreased range of motion, Increased fascial restricitons, Decreased skin integrity, Impaired flexibility, Decreased scar mobility  Visit Diagnosis: Other symptoms and signs involving the musculoskeletal system  Stiffness of left wrist, not elsewhere classified  Stiffness of left elbow, not elsewhere classified  Pain in left elbow  Pain in left wrist  Muscle weakness (generalized)    Problem List Patient Active Problem List   Diagnosis Date Noted  . Humerus distal fracture 08/02/2015    Guadelupe Sabin, OTR/L  [phone]  09/27/2015, 2:10 PM  [place], [place], [address] Phone: [phone]   Fax:  [phone]  Name: Erica Mora MRN: [id number] Date of Birth: April 30, 1941

## 2015-09-30 ENCOUNTER — Ambulatory Visit (HOSPITAL_COMMUNITY): Payer: Medicare Other

## 2015-09-30 DIAGNOSIS — M25622 Stiffness of left elbow, not elsewhere classified: Secondary | ICD-10-CM

## 2015-09-30 DIAGNOSIS — M25632 Stiffness of left wrist, not elsewhere classified: Secondary | ICD-10-CM

## 2015-09-30 DIAGNOSIS — R29898 Other symptoms and signs involving the musculoskeletal system: Secondary | ICD-10-CM

## 2015-09-30 DIAGNOSIS — M7989 Other specified soft tissue disorders: Secondary | ICD-10-CM | POA: Diagnosis not present

## 2015-09-30 NOTE — Therapy (Signed)
[place], [place], [address] Phone: [phone]   Fax:  [phone]  Occupational Therapy Treatment  Patient Details  Name: Erica Mora MRN: [id number] Date of Birth: 03/28/1941 Referring Provider: Altamese [place], MD  Encounter Date: 09/30/2015      OT End of Session - 09/30/15 1349    Visit Number 12   Number of Visits 24   Date for OT Re-Evaluation 10/29/15   Authorization Type Medicare/Medicare A& B primary; AARP secondary   Authorization Time Period Before 10th visit   Authorization - Visit Number 12   Authorization - Number of Visits 20   OT Start Time 1306   OT Stop Time 1345   OT Time Calculation (min) 39 min   Activity Tolerance Patient tolerated treatment well   Behavior During Therapy [facility] for tasks assessed/performed      Past Medical History  Diagnosis Date  . Medical history non-contributory     Past Surgical History  Procedure Laterality Date  . Vaginal delivery    . Tubal ligation    . Eye surgery      cataracts- bilateral    . Orif humerus fracture Left 08/02/2015    Procedure: OPEN REDUCTION INTERNAL FIXATION (ORIF)LEFT DISTAL RADIUS,HUMERUS FRACTURE;  Surgeon: Altamese [place], MD;  Location: [facility];  Service: Orthopedics;  Laterality: Left;    There were no vitals filed for this visit.                    OT Treatments/Exercises (OP) - 09/30/15 1337    Exercises   Exercises Elbow;Wrist;Shoulder;Hand   Elbow Exercises   Elbow Extension PROM;Strengthening;10 reps   Bar Weights/Barbell (Elbow Extension) 1 lb   Forearm Supination PROM;Strengthening;10 reps  2#   Forearm Pronation PROM;Strengthening;10 reps  2#   Wrist Flexion PROM;10 reps   Wrist Extension PROM;10 reps   Other elbow exercises PROM and strengthening elbow flexion; 10X; 2#   Other elbow exercises weighted elbow extension stretch, 2# 3X30"; elbow extension stretch using mat table hold for 3X30";  flexion stretch-leaning with elbows on mat table 2X30"   Weighted Stretch Over Towel Roll   Supination - Weighted Stretch 2 pounds;30 seconds   Wrist Extension - Weighted Stretch 2 pounds;30 seconds   Modalities   Modalities Moist Heat   Moist Heat Therapy   Number Minutes Moist Heat 10 Minutes   Moist Heat Location Elbow;Wrist;Hand   Manual Therapy   Manual Therapy Myofascial release;Muscle Energy Technique   Manual therapy comments Manual therapy was completed prior to exercises.   Myofascial Release Myofascial release and manual stretching to left hand/digits, wrist, elbow region to decrease fascial restrictions and increase joint mobility in a pain free zone.    Muscle Energy Technique Muscle energy technique to left elbow flexors and extensors and wrist flexors, extensors, supinators, and pronators to relax muscle spasms and increase joint mobility                  OT Short Term Goals - 09/30/15 1412    OT SHORT TERM GOAL #1   Title Pt will be provided with and educated on HEP.    Time 4   Period Weeks   Status On-going   OT SHORT TERM GOAL #2   Title Pt will decrease fascial restrictions from max to mod amounts to increase joint mobility of the LUE at the elbow and wrist.    Time 4   Period Weeks  Status Partially Met   OT SHORT TERM GOAL #3   Title Pt will decrease pain in LUE to 3/10 or less to increase ability to perform HEP   Time 4   Period Weeks   OT SHORT TERM GOAL #4   Title Pt will increase left elbow P/ROM to [facility] to increase ability use LUE as assist during dressing tasks.    Time 4   Period Weeks   Status On-going   OT SHORT TERM GOAL #5   Title Pt will increase left wrist P/ROM to [facility] to increase ability to use LUE as assist during grooming tasks.    Time 4   Period Weeks   Status Partially Met   OT SHORT TERM GOAL #6   Title Pt will increase LUE strength to 3+/5 to increase ability to use LUE as assist when completing light housework tasks.     Time 4   Period Weeks           OT Long Term Goals - 09/27/15 1403    OT LONG TERM GOAL #1   Title Pt will return to prior level of functioning and independence during daily and leisure tasks.    Time 8   Period Weeks   Status On-going   OT LONG TERM GOAL #2   Title Pt will decrease fascial restrictions in LUE from mod to min amounts or less to increase mobility during functional use of LUE.    Time 8   Period Weeks   Status On-going   OT LONG TERM GOAL #3   Title Pt will decrease pain to 1/10 or less to increase ability to actively use  LUE during daily tasks.    Time 8   Period Weeks   Status On-going   OT LONG TERM GOAL #4   Title Pt will increase A/ROM of left elbow to [facility] to improve ability to wash and style hair independently.    Time 8   Period Weeks   Status On-going   OT LONG TERM GOAL #5   Title Pt will increase A/ROM of left wrist to [facility] to increase ability to functionally use LUE when preparing meals.    Time 8   Period Weeks   Status On-going   OT LONG TERM GOAL #6   Title Pt will increase LUE strength to 4/5 to increase ability to lift weighted objects such as pots and pans.   Time 8   Period Weeks   Status On-going               Plan - 09/30/15 1352    Clinical Impression Statement A: Added strengthening with 2# for flexion and extension of elbow and supination and pronation. Patient able to achieve slightly greater passive range up motion with added weight for stretching. Pt mentioned she was having difficulty a little difficulty putting on JAS brace. She wasn't sure if she was doing it correctly. Pt was encouraged to bring brace in if she would would like [place] to assess how she does with donning.    Plan P: Continue with using 2# weight to continue increasing ROM needed during functional tasks.      Patient will benefit from skilled therapeutic intervention in order to improve the following deficits and impairments:  Decreased strength, Impaired  sensation, Pain, Increased edema, Impaired UE functional use, Decreased range of motion, Increased fascial restricitons, Decreased skin integrity, Impaired flexibility, Decreased scar mobility  Visit Diagnosis: Other symptoms and signs involving the  musculoskeletal system  Stiffness of left wrist, not elsewhere classified  Stiffness of left elbow, not elsewhere classified    Problem List Patient Active Problem List   Diagnosis Date Noted  . Humerus distal fracture 08/02/2015    Ailene Ravel, OTR/L,CBIS  [phone]   09/30/2015, 2:16 PM  [place] [address] [place], [place], [address] Phone: [phone]   Fax:  [phone]  Name: Erica Mora MRN: [id number] Date of Birth: August 04, 1940

## 2015-10-02 ENCOUNTER — Ambulatory Visit (HOSPITAL_COMMUNITY): Payer: Medicare Other | Admitting: Occupational Therapy

## 2015-10-02 ENCOUNTER — Encounter (HOSPITAL_COMMUNITY): Payer: Self-pay | Admitting: Occupational Therapy

## 2015-10-02 DIAGNOSIS — M25522 Pain in left elbow: Secondary | ICD-10-CM

## 2015-10-02 DIAGNOSIS — M6281 Muscle weakness (generalized): Secondary | ICD-10-CM

## 2015-10-02 DIAGNOSIS — M25532 Pain in left wrist: Secondary | ICD-10-CM

## 2015-10-02 DIAGNOSIS — M25622 Stiffness of left elbow, not elsewhere classified: Secondary | ICD-10-CM

## 2015-10-02 DIAGNOSIS — R29898 Other symptoms and signs involving the musculoskeletal system: Secondary | ICD-10-CM

## 2015-10-02 DIAGNOSIS — M25632 Stiffness of left wrist, not elsewhere classified: Secondary | ICD-10-CM

## 2015-10-02 DIAGNOSIS — M7989 Other specified soft tissue disorders: Secondary | ICD-10-CM | POA: Diagnosis not present

## 2015-10-02 NOTE — Therapy (Signed)
[place], [place], [address] Phone: [phone]   Fax:  [phone]  Occupational Therapy Treatment  Patient Details  Name: Erica Mora MRN: [id number] Date of Birth: 03/06/1941 Referring Provider: Altamese [place], MD  Encounter Date: 10/02/2015      OT End of Session - 10/02/15 1517    Visit Number 13   Number of Visits 24   Date for OT Re-Evaluation 10/29/15   Authorization Type Medicare/Medicare A& B primary; AARP secondary   Authorization Time Period Before 10th visit   Authorization - Visit Number 13   Authorization - Number of Visits 20   OT Start Time 1300   OT Stop Time 1349   OT Time Calculation (min) 49 min   Activity Tolerance Patient tolerated treatment well   Behavior During Therapy [facility] for tasks assessed/performed      Past Medical History  Diagnosis Date  . Medical history non-contributory     Past Surgical History  Procedure Laterality Date  . Vaginal delivery    . Tubal ligation    . Eye surgery      cataracts- bilateral    . Orif humerus fracture Left 08/02/2015    Procedure: OPEN REDUCTION INTERNAL FIXATION (ORIF)LEFT DISTAL RADIUS,HUMERUS FRACTURE;  Surgeon: Altamese [place], MD;  Location: [facility];  Service: Orthopedics;  Laterality: Left;    There were no vitals filed for this visit.      Subjective Assessment - 10/02/15 1300    Subjective  S: I've been wearing that hateful brace.    Currently in Pain? No/denies            [facility] OT Assessment - 10/02/15 1300    Assessment   Diagnosis s/p ORIF-left distal radius fx & left distal humerus fx   Precautions   Precautions Other (comment)   Precaution Comments Gentle P/ROM, AA/ROM, A/ROM progressing as tolerated, no resistance or strengthening for 8 weeks   Restrictions   Weight Bearing Restrictions Yes   LUE Weight Bearing Non weight bearing                  OT Treatments/Exercises (OP) - 10/02/15 1311    Exercises   Exercises Elbow;Wrist;Shoulder;Hand   Elbow Exercises   Elbow Extension PROM;10 reps   Forearm Supination PROM;10 reps   Forearm Pronation PROM;10 reps   Wrist Flexion PROM;10 reps   Wrist Extension PROM;10 reps   Other elbow exercises PROM elbow flexion; 10X   Other elbow exercises weighted elbow extension stretch, 2# 2X1'; elbow extension stretch using mat table hold for 3X30"; flexion stretch-leaning with elbows on mat table 3X30"   Weighted Stretch Over Towel Roll   Supination - Weighted Stretch 2 pounds;30 seconds  2 reps   Pronation - Weighted Stretch 2 pounds;30 seconds  2 reps   Wrist Flexion - Weighted Stretch 2 pounds;30 seconds  2 reps   Wrist Extension - Weighted Stretch 2 pounds;30 seconds  2 reps   Manual Therapy   Manual Therapy Myofascial release;Muscle Energy Technique   Manual therapy comments Manual therapy was completed prior to exercises.   Myofascial Release Myofascial release and manual stretching to left hand/digits, wrist, elbow region to decrease fascial restrictions and increase joint mobility in a pain free zone.    Muscle Energy Technique Muscle energy technique to left elbow flexors and extensors and wrist flexors, extensors, supinators, and pronators to relax muscle spasms and increase joint mobility  OT Short Term Goals - 09/30/15 1412    OT SHORT TERM GOAL #1   Title Pt will be provided with and educated on HEP.    Time 4   Period Weeks   Status On-going   OT SHORT TERM GOAL #2   Title Pt will decrease fascial restrictions from max to mod amounts to increase joint mobility of the LUE at the elbow and wrist.    Time 4   Period Weeks   Status Partially Met   OT SHORT TERM GOAL #3   Title Pt will decrease pain in LUE to 3/10 or less to increase ability to perform HEP   Time 4   Period Weeks   OT SHORT TERM GOAL #4   Title Pt will increase left elbow P/ROM to [facility] to increase ability use LUE as assist during  dressing tasks.    Time 4   Period Weeks   Status On-going   OT SHORT TERM GOAL #5   Title Pt will increase left wrist P/ROM to [facility] to increase ability to use LUE as assist during grooming tasks.    Time 4   Period Weeks   Status Partially Met   OT SHORT TERM GOAL #6   Title Pt will increase LUE strength to 3+/5 to increase ability to use LUE as assist when completing light housework tasks.    Time 4   Period Weeks           OT Long Term Goals - 09/27/15 1403    OT LONG TERM GOAL #1   Title Pt will return to prior level of functioning and independence during daily and leisure tasks.    Time 8   Period Weeks   Status On-going   OT LONG TERM GOAL #2   Title Pt will decrease fascial restrictions in LUE from mod to min amounts or less to increase mobility during functional use of LUE.    Time 8   Period Weeks   Status On-going   OT LONG TERM GOAL #3   Title Pt will decrease pain to 1/10 or less to increase ability to actively use  LUE during daily tasks.    Time 8   Period Weeks   Status On-going   OT LONG TERM GOAL #4   Title Pt will increase A/ROM of left elbow to [facility] to improve ability to wash and style hair independently.    Time 8   Period Weeks   Status On-going   OT LONG TERM GOAL #5   Title Pt will increase A/ROM of left wrist to [facility] to increase ability to functionally use LUE when preparing meals.    Time 8   Period Weeks   Status On-going   OT LONG TERM GOAL #6   Title Pt will increase LUE strength to 4/5 to increase ability to lift weighted objects such as pots and pans.   Time 8   Period Weeks   Status On-going               Plan - 10/02/15 1517    Clinical Impression Statement A: Added wrist flexion and forearm pronation weighted stretch this session. Continued aggressive passive stretching, did not complete strengthening with 2# weight due to time constraints. Pt reports she is unsure if the JAS brace is fitting correctly for elbow extension and  flexion, instructed pt to bring brace to next appt to assess donning and fit of brace.    Rehab Potential Good  OT Frequency 3x / week   OT Duration 8 weeks   OT Treatment/Interventions Self-care/ADL training;Ultrasound;DME and/or AE instruction;Scar mobilization;Passive range of motion;Patient/family education;Cryotherapy;Electrical Stimulation;Splinting;Moist Heat;Therapeutic exercise;Manual Therapy;Therapeutic activities   Plan P: Resume strengthening exercises, provide pt with HEP of all stretches thus far. Assess fit of JAS brace   Consulted and Agree with Plan of Care Patient      Patient will benefit from skilled therapeutic intervention in order to improve the following deficits and impairments:  Decreased strength, Impaired sensation, Pain, Increased edema, Impaired UE functional use, Decreased range of motion, Increased fascial restricitons, Decreased skin integrity, Impaired flexibility, Decreased scar mobility  Visit Diagnosis: Other symptoms and signs involving the musculoskeletal system  Stiffness of left wrist, not elsewhere classified  Stiffness of left elbow, not elsewhere classified  Pain in left elbow  Pain in left wrist  Muscle weakness (generalized)    Problem List Patient Active Problem List   Diagnosis Date Noted  . Humerus distal fracture 08/02/2015   Guadelupe Sabin, OTR/L  [phone]  10/02/2015, 3:20 PM  [place], [place], [address] Phone: [phone]   Fax:  [phone]  Name: Lenice Koper MRN: [id number] Date of Birth: 1940/07/07

## 2015-10-04 ENCOUNTER — Encounter (HOSPITAL_COMMUNITY): Payer: Self-pay | Admitting: Occupational Therapy

## 2015-10-04 ENCOUNTER — Ambulatory Visit (HOSPITAL_COMMUNITY): Payer: Medicare Other | Admitting: Occupational Therapy

## 2015-10-04 DIAGNOSIS — M6281 Muscle weakness (generalized): Secondary | ICD-10-CM

## 2015-10-04 DIAGNOSIS — M7989 Other specified soft tissue disorders: Secondary | ICD-10-CM | POA: Diagnosis not present

## 2015-10-04 DIAGNOSIS — M25622 Stiffness of left elbow, not elsewhere classified: Secondary | ICD-10-CM

## 2015-10-04 DIAGNOSIS — R29898 Other symptoms and signs involving the musculoskeletal system: Secondary | ICD-10-CM

## 2015-10-04 DIAGNOSIS — M25532 Pain in left wrist: Secondary | ICD-10-CM

## 2015-10-04 DIAGNOSIS — M25522 Pain in left elbow: Secondary | ICD-10-CM

## 2015-10-04 DIAGNOSIS — M25632 Stiffness of left wrist, not elsewhere classified: Secondary | ICD-10-CM

## 2015-10-04 NOTE — Therapy (Signed)
[place], [place], [address] Phone: [phone]   Fax:  [phone]  Occupational Therapy Treatment  Patient Details  Name: Erica Mora MRN: [id number] Date of Birth: 26-Jun-1940 Referring Provider: Altamese [place], MD  Encounter Date: 10/04/2015      OT End of Session - 10/04/15 2320    Visit Number 14   Number of Visits 24   Date for OT Re-Evaluation 10/29/15   Authorization Type Medicare/Medicare A& B primary; AARP secondary   Authorization Time Period Before 10th visit   Authorization - Visit Number 14   Authorization - Number of Visits 20   OT Start Time 9379   OT Stop Time 1340   OT Time Calculation (min) 33 min   Activity Tolerance Patient tolerated treatment well   Behavior During Therapy [facility] for tasks assessed/performed      Past Medical History  Diagnosis Date  . Medical history non-contributory     Past Surgical History  Procedure Laterality Date  . Vaginal delivery    . Tubal ligation    . Eye surgery      cataracts- bilateral    . Orif humerus fracture Left 08/02/2015    Procedure: OPEN REDUCTION INTERNAL FIXATION (ORIF)LEFT DISTAL RADIUS,HUMERUS FRACTURE;  Surgeon: Altamese , MD;  Location: [place];  Service: Orthopedics;  Laterality: Left;    There were no vitals filed for this visit.      Subjective Assessment - 10/04/15 2318    Subjective  S: I don't think this brace is working right in elbow extension.    Currently in Pain? No/denies            [facility] OT Assessment - 10/04/15 2318    Assessment   Diagnosis s/p ORIF-left distal radius fx & left distal humerus fx   Precautions   Precautions Other (comment)   Precaution Comments Gentle P/ROM, AA/ROM, A/ROM progressing as tolerated, no resistance or strengthening for 8 weeks                  OT Treatments/Exercises (OP) - 10/04/15 2318    Exercises   Exercises Elbow;Wrist;Shoulder;Hand   Elbow Exercises    Elbow Extension PROM;10 reps   Forearm Supination PROM;10 reps   Forearm Pronation PROM;10 reps   Wrist Flexion PROM;10 reps   Wrist Extension PROM;10 reps   Other elbow exercises PROM elbow flexion; 10X   Manual Therapy   Manual Therapy Myofascial release;Muscle Energy Technique   Manual therapy comments Manual therapy was completed prior to exercises.   Myofascial Release Myofascial release and manual stretching to left hand/digits, wrist, elbow region to decrease fascial restrictions and increase joint mobility in a pain free zone.    Muscle Energy Technique Muscle energy technique to left elbow flexors and extensors and wrist flexors, extensors, supinators, and pronators to relax muscle spasms and increase joint mobility                  OT Short Term Goals - 09/30/15 1412    OT SHORT TERM GOAL #1   Title Pt will be provided with and educated on HEP.    Time 4   Period Weeks   Status On-going   OT SHORT TERM GOAL #2   Title Pt will decrease fascial restrictions from max to mod amounts to increase joint mobility of the LUE at the elbow and wrist.    Time 4   Period Weeks   Status Partially Met  OT SHORT TERM GOAL #3   Title Pt will decrease pain in LUE to 3/10 or less to increase ability to perform HEP   Time 4   Period Weeks   OT SHORT TERM GOAL #4   Title Pt will increase left elbow P/ROM to [facility] to increase ability use LUE as assist during dressing tasks.    Time 4   Period Weeks   Status On-going   OT SHORT TERM GOAL #5   Title Pt will increase left wrist P/ROM to [facility] to increase ability to use LUE as assist during grooming tasks.    Time 4   Period Weeks   Status Partially Met   OT SHORT TERM GOAL #6   Title Pt will increase LUE strength to 3+/5 to increase ability to use LUE as assist when completing light housework tasks.    Time 4   Period Weeks           OT Long Term Goals - 09/27/15 1403    OT LONG TERM GOAL #1   Title Pt will return to  prior level of functioning and independence during daily and leisure tasks.    Time 8   Period Weeks   Status On-going   OT LONG TERM GOAL #2   Title Pt will decrease fascial restrictions in LUE from mod to min amounts or less to increase mobility during functional use of LUE.    Time 8   Period Weeks   Status On-going   OT LONG TERM GOAL #3   Title Pt will decrease pain to 1/10 or less to increase ability to actively use  LUE during daily tasks.    Time 8   Period Weeks   Status On-going   OT LONG TERM GOAL #4   Title Pt will increase A/ROM of left elbow to [facility] to improve ability to wash and style hair independently.    Time 8   Period Weeks   Status On-going   OT LONG TERM GOAL #5   Title Pt will increase A/ROM of left wrist to [facility] to increase ability to functionally use LUE when preparing meals.    Time 8   Period Weeks   Status On-going   OT LONG TERM GOAL #6   Title Pt will increase LUE strength to 4/5 to increase ability to lift weighted objects such as pots and pans.   Time 8   Period Weeks   Status On-going               Plan - 10/04/15 2320    Clinical Impression Statement A: Pt brought JAS brace this session for OT to assess fit. Elbow flexion was appropriate and fit is comfortable for pt, elbow extension is uncomfortable for pt and correct fit is hindered due to bicep cuff extending too far into axilla and digging into patient's arm, pt is unable to fasten cuff. Discussed fit with pt and instructed pt to call JAS representative to discuss fitting adjustment options. Stretching completed this session post manual therapy, pt declined to complete weighted stretches or A/ROM exercises this session.    Rehab Potential Good   OT Frequency 3x / week   OT Duration 8 weeks   OT Treatment/Interventions Self-care/ADL training;Ultrasound;DME and/or AE instruction;Scar mobilization;Passive range of motion;Patient/family education;Cryotherapy;Electrical  Stimulation;Splinting;Moist Heat;Therapeutic exercise;Manual Therapy;Therapeutic activities   Plan P: Fabricate static volar elbow extension splint for nighttime wear. Follow up on contacting JAS rep      Patient will benefit  from skilled therapeutic intervention in order to improve the following deficits and impairments:  Decreased strength, Impaired sensation, Pain, Increased edema, Impaired UE functional use, Decreased range of motion, Increased fascial restricitons, Decreased skin integrity, Impaired flexibility, Decreased scar mobility  Visit Diagnosis: Other symptoms and signs involving the musculoskeletal system  Stiffness of left wrist, not elsewhere classified  Stiffness of left elbow, not elsewhere classified  Pain in left elbow  Pain in left wrist  Muscle weakness (generalized)    Problem List Patient Active Problem List   Diagnosis Date Noted  . Humerus distal fracture 08/02/2015    Guadelupe Sabin, OTR/L  [phone]  10/04/2015, 11:26 PM  [place], [place], [address] Phone: [phone]   Fax:  [phone]  Name: Erica Mora MRN: [id number] Date of Birth: 08/26/1940

## 2015-10-07 ENCOUNTER — Ambulatory Visit (HOSPITAL_COMMUNITY): Payer: Medicare Other

## 2015-10-07 DIAGNOSIS — M25622 Stiffness of left elbow, not elsewhere classified: Secondary | ICD-10-CM

## 2015-10-07 DIAGNOSIS — M7989 Other specified soft tissue disorders: Secondary | ICD-10-CM | POA: Diagnosis not present

## 2015-10-07 DIAGNOSIS — M25632 Stiffness of left wrist, not elsewhere classified: Secondary | ICD-10-CM

## 2015-10-07 DIAGNOSIS — R29898 Other symptoms and signs involving the musculoskeletal system: Secondary | ICD-10-CM

## 2015-10-07 NOTE — Therapy (Signed)
[place], [place], [address] Phone: [phone]   Fax:  [phone]  Occupational Therapy Treatment  Patient Details  Name: Erica Mora MRN: [id number] Date of Birth: 11/06/1940 Referring Provider: Altamese [place], MD  Encounter Date: 10/07/2015      OT End of Session - 10/07/15 1505    Visit Number 15   Number of Visits 24   Date for OT Re-Evaluation 10/29/15   Authorization Type Medicare/Medicare A& B primary; AARP secondary   Authorization Time Period Before 10th visit   Authorization - Visit Number 15   Authorization - Number of Visits 20   OT Start Time 1302   OT Stop Time 1345   OT Time Calculation (min) 43 min   Activity Tolerance Patient tolerated treatment well   Behavior During Therapy [facility] for tasks assessed/performed      Past Medical History  Diagnosis Date  . Medical history non-contributory     Past Surgical History  Procedure Laterality Date  . Vaginal delivery    . Tubal ligation    . Eye surgery      cataracts- bilateral    . Orif humerus fracture Left 08/02/2015    Procedure: OPEN REDUCTION INTERNAL FIXATION (ORIF)LEFT DISTAL RADIUS,HUMERUS FRACTURE;  Surgeon: Altamese [place], MD;  Location: [place];  Service: Orthopedics;  Laterality: Left;    There were no vitals filed for this visit.      Subjective Assessment - 10/07/15 1335    Subjective  S: I feel like something is in the there stopping it from straightening out. I see the doctor next wednesday.   Currently in Pain? No/denies            [facility] OT Assessment - 10/07/15 1336    Assessment   Diagnosis s/p ORIF-left distal radius fx & left distal humerus fx   Precautions   Precautions Other (comment)   Precaution Comments Gentle P/ROM, AA/ROM, A/ROM progressing as tolerated, no resistance or strengthening for 8 weeks                  OT Treatments/Exercises (OP) - 10/07/15 1336    Exercises   Exercises  Elbow;Wrist;Shoulder;Hand   Elbow Exercises   Elbow Extension PROM;Strengthening;10 reps   Bar Weights/Barbell (Elbow Extension) 1 lb   Forearm Supination PROM;10 reps   Forearm Pronation PROM;10 reps   Wrist Flexion PROM;10 reps   Wrist Extension PROM;10 reps   Other elbow exercises PROM elbow flexion; 10X   Weighted Stretch Over Towel Roll   Supination - Weighted Stretch 2 pounds  45 seconds   Pronation - Weighted Stretch 2 pounds  45 seconds   Wrist Flexion - Weighted Stretch 2 pounds  45 seconds   Wrist Extension - Weighted Stretch 2 pounds  45 seconds   Modalities   Modalities Moist Heat   Moist Heat Therapy   Number Minutes Moist Heat 10 Minutes   Moist Heat Location Elbow;Wrist;Hand   Manual Therapy   Manual Therapy Myofascial release;Muscle Energy Technique   Manual therapy comments Manual therapy was completed prior to exercises.   Myofascial Release Myofascial release and manual stretching to left hand/digits, wrist, elbow region to decrease fascial restrictions and increase joint mobility in a pain free zone.    Muscle Energy Technique Muscle energy technique to left elbow flexors and extensors and wrist flexors, extensors, supinators, and pronators to relax muscle spasms and increase joint mobility  OT Short Term Goals - 09/30/15 1412    OT SHORT TERM GOAL #1   Title Pt will be provided with and educated on HEP.    Time 4   Period Weeks   Status On-going   OT SHORT TERM GOAL #2   Title Pt will decrease fascial restrictions from max to mod amounts to increase joint mobility of the LUE at the elbow and wrist.    Time 4   Period Weeks   Status Partially Met   OT SHORT TERM GOAL #3   Title Pt will decrease pain in LUE to 3/10 or less to increase ability to perform HEP   Time 4   Period Weeks   OT SHORT TERM GOAL #4   Title Pt will increase left elbow P/ROM to [facility] to increase ability use LUE as assist during dressing tasks.    Time 4    Period Weeks   Status On-going   OT SHORT TERM GOAL #5   Title Pt will increase left wrist P/ROM to [facility] to increase ability to use LUE as assist during grooming tasks.    Time 4   Period Weeks   Status Partially Met   OT SHORT TERM GOAL #6   Title Pt will increase LUE strength to 3+/5 to increase ability to use LUE as assist when completing light housework tasks.    Time 4   Period Weeks           OT Long Term Goals - 09/27/15 1403    OT LONG TERM GOAL #1   Title Pt will return to prior level of functioning and independence during daily and leisure tasks.    Time 8   Period Weeks   Status On-going   OT LONG TERM GOAL #2   Title Pt will decrease fascial restrictions in LUE from mod to min amounts or less to increase mobility during functional use of LUE.    Time 8   Period Weeks   Status On-going   OT LONG TERM GOAL #3   Title Pt will decrease pain to 1/10 or less to increase ability to actively use  LUE during daily tasks.    Time 8   Period Weeks   Status On-going   OT LONG TERM GOAL #4   Title Pt will increase A/ROM of left elbow to [facility] to improve ability to wash and style hair independently.    Time 8   Period Weeks   Status On-going   OT LONG TERM GOAL #5   Title Pt will increase A/ROM of left wrist to [facility] to increase ability to functionally use LUE when preparing meals.    Time 8   Period Weeks   Status On-going   OT LONG TERM GOAL #6   Title Pt will increase LUE strength to 4/5 to increase ability to lift weighted objects such as pots and pans.   Time 8   Period Weeks   Status On-going               Plan - 10/07/15 1506    Clinical Impression Statement A: Discussed fabricating a volar elbow extension splint that may help keep elbow in extensionwhich may help decrease stiffness in the morning. patient at this time was not interested in another splint but will keep it in mind if needed in the future. Patient reports that she is seeing the MD for a follow  up next Wednesday.    Plan P: Continue with  manual stretching and Active and strengthening exercises to increase functional performance during daily tasks.       Patient will benefit from skilled therapeutic intervention in order to improve the following deficits and impairments:     Visit Diagnosis: Other symptoms and signs involving the musculoskeletal system  Stiffness of left wrist, not elsewhere classified  Stiffness of left elbow, not elsewhere classified    Problem List Patient Active Problem List   Diagnosis Date Noted  . Humerus distal fracture 08/02/2015   Erica Mora, OTR/L,CBIS  [phone]  10/07/2015, 3:14 PM  [place] [address] [place], [place], [address] Phone: [phone]   Fax:  [phone]  Name: Erica Mora MRN: [id number] Date of Birth: 12/05/1940

## 2015-10-09 ENCOUNTER — Ambulatory Visit (HOSPITAL_COMMUNITY): Payer: Medicare Other | Admitting: Occupational Therapy

## 2015-10-09 ENCOUNTER — Encounter (HOSPITAL_COMMUNITY): Payer: Self-pay | Admitting: Occupational Therapy

## 2015-10-09 DIAGNOSIS — R29898 Other symptoms and signs involving the musculoskeletal system: Secondary | ICD-10-CM

## 2015-10-09 DIAGNOSIS — M25632 Stiffness of left wrist, not elsewhere classified: Secondary | ICD-10-CM

## 2015-10-09 DIAGNOSIS — M6281 Muscle weakness (generalized): Secondary | ICD-10-CM

## 2015-10-09 DIAGNOSIS — M25622 Stiffness of left elbow, not elsewhere classified: Secondary | ICD-10-CM

## 2015-10-09 DIAGNOSIS — M25532 Pain in left wrist: Secondary | ICD-10-CM

## 2015-10-09 DIAGNOSIS — M7989 Other specified soft tissue disorders: Secondary | ICD-10-CM | POA: Diagnosis not present

## 2015-10-09 DIAGNOSIS — M25522 Pain in left elbow: Secondary | ICD-10-CM

## 2015-10-09 NOTE — Therapy (Signed)
[place], [place], [address] Phone: [phone]   Fax:  [phone]  Occupational Therapy Treatment  Patient Details  Name: Erica Mora MRN: [id number] Date of Birth: April 21, 1941 Referring Provider: Altamese [place], MD  Encounter Date: 10/09/2015      OT End of Session - 10/09/15 1403    Visit Number 16   Number of Visits 24   Date for OT Re-Evaluation 10/29/15   Authorization Type Medicare/Medicare A& B primary; AARP secondary   Authorization Time Period Before 10th visit   Authorization - Visit Number 16   Authorization - Number of Visits 20   OT Start Time 1303   OT Stop Time 1348   OT Time Calculation (min) 45 min   Activity Tolerance Patient tolerated treatment well   Behavior During Therapy [facility] for tasks assessed/performed      Past Medical History  Diagnosis Date  . Medical history non-contributory     Past Surgical History  Procedure Laterality Date  . Vaginal delivery    . Tubal ligation    . Eye surgery      cataracts- bilateral    . Orif humerus fracture Left 08/02/2015    Procedure: OPEN REDUCTION INTERNAL FIXATION (ORIF)LEFT DISTAL RADIUS,HUMERUS FRACTURE;  Surgeon: Altamese [place], MD;  Location: [place];  Service: Orthopedics;  Laterality: Left;    There were no vitals filed for this visit.      Subjective Assessment - 10/09/15 1305    Subjective  S: I got Theresia Lo to come and adjust the JAS brace but I don't think it works right.    Currently in Pain? No/denies            [facility] OT Assessment - 10/09/15 1304    Assessment   Diagnosis s/p ORIF-left distal radius fx & left distal humerus fx   Precautions   Precautions Other (comment)   Precaution Comments Gentle P/ROM, AA/ROM, A/ROM progressing as tolerated, no resistance or strengthening for 8 weeks                  OT Treatments/Exercises (OP) - 10/09/15 1307    Exercises   Exercises Elbow;Wrist;Shoulder;Hand    Elbow Exercises   Elbow Extension PROM;Strengthening;10 reps   Bar Weights/Barbell (Elbow Extension) 1 lb   Forearm Supination PROM;10 reps   Forearm Pronation PROM;10 reps   Wrist Flexion PROM;10 reps   Wrist Extension PROM;10 reps   Other elbow exercises PROM elbow flexion; 10X   Other elbow exercises elbow flexion strengthening 10X 1# weight,    Weighted Stretch Over Towel Roll   Supination - Weighted Stretch 2 pounds  45 seconds   Pronation - Weighted Stretch 2 pounds  45 seconds   Wrist Flexion - Weighted Stretch 2 pounds  45 seconds   Wrist Extension - Weighted Stretch 2 pounds  45 seconds   Manual Therapy   Manual Therapy Myofascial release;Muscle Energy Technique   Manual therapy comments Manual therapy was completed prior to exercises.   Myofascial Release Myofascial release and manual stretching to left hand/digits, wrist, elbow region to decrease fascial restrictions and increase joint mobility in a pain free zone.    Muscle Energy Technique Muscle energy technique to left elbow flexors and extensors and wrist flexors, extensors, supinators, and pronators to relax muscle spasms and increase joint mobility                OT Education - 10/09/15 1403    Education provided  Yes   Education Details wrist and elbow stretches and A/ROM exercises   Person(s) Educated Patient   Methods Explanation;Demonstration;Handout   Comprehension Verbalized understanding;Returned demonstration          OT Short Term Goals - 09/30/15 1412    OT SHORT TERM GOAL #1   Title Pt will be provided with and educated on HEP.    Time 4   Period Weeks   Status On-going   OT SHORT TERM GOAL #2   Title Pt will decrease fascial restrictions from max to mod amounts to increase joint mobility of the LUE at the elbow and wrist.    Time 4   Period Weeks   Status Partially Met   OT SHORT TERM GOAL #3   Title Pt will decrease pain in LUE to 3/10 or less to increase ability to perform HEP    Time 4   Period Weeks   OT SHORT TERM GOAL #4   Title Pt will increase left elbow P/ROM to [facility] to increase ability use LUE as assist during dressing tasks.    Time 4   Period Weeks   Status On-going   OT SHORT TERM GOAL #5   Title Pt will increase left wrist P/ROM to [facility] to increase ability to use LUE as assist during grooming tasks.    Time 4   Period Weeks   Status Partially Met   OT SHORT TERM GOAL #6   Title Pt will increase LUE strength to 3+/5 to increase ability to use LUE as assist when completing light housework tasks.    Time 4   Period Weeks           OT Long Term Goals - 09/27/15 1403    OT LONG TERM GOAL #1   Title Pt will return to prior level of functioning and independence during daily and leisure tasks.    Time 8   Period Weeks   Status On-going   OT LONG TERM GOAL #2   Title Pt will decrease fascial restrictions in LUE from mod to min amounts or less to increase mobility during functional use of LUE.    Time 8   Period Weeks   Status On-going   OT LONG TERM GOAL #3   Title Pt will decrease pain to 1/10 or less to increase ability to actively use  LUE during daily tasks.    Time 8   Period Weeks   Status On-going   OT LONG TERM GOAL #4   Title Pt will increase A/ROM of left elbow to [facility] to improve ability to wash and style hair independently.    Time 8   Period Weeks   Status On-going   OT LONG TERM GOAL #5   Title Pt will increase A/ROM of left wrist to [facility] to increase ability to functionally use LUE when preparing meals.    Time 8   Period Weeks   Status On-going   OT LONG TERM GOAL #6   Title Pt will increase LUE strength to 4/5 to increase ability to lift weighted objects such as pots and pans.   Time 8   Period Weeks   Status On-going               Plan - 10/09/15 1403    Clinical Impression Statement A: Continued with manual stretching and aggressive strengthening this session, pt with improved flexion during mat stretch. Pt  reports she contacted [employer] and the representative came and adjusted  her brace, however she still feels it is not helping much for elbow extension. Pt also reports she is carrying large bottles of liquid washing detergent at home for elbow extension.    Rehab Potential Good   OT Frequency 3x / week   OT Duration 8 weeks   OT Treatment/Interventions Self-care/ADL training;Ultrasound;DME and/or AE instruction;Scar mobilization;Passive range of motion;Patient/family education;Cryotherapy;Electrical Stimulation;Splinting;Moist Heat;Therapeutic exercise;Manual Therapy;Therapeutic activities   Plan P: continue with manual stretch, aggressive stretching, strengthening exercises, add joint mobilizations to work on improved elbow flexion and extension.    OT Home Exercise Plan wrist and elbow A/ROM and stretches   Consulted and Agree with Plan of Care Patient      Patient will benefit from skilled therapeutic intervention in order to improve the following deficits and impairments:  Decreased strength, Impaired sensation, Pain, Increased edema, Impaired UE functional use, Decreased range of motion, Increased fascial restricitons, Decreased skin integrity, Impaired flexibility, Decreased scar mobility  Visit Diagnosis: Other symptoms and signs involving the musculoskeletal system  Stiffness of left wrist, not elsewhere classified  Stiffness of left elbow, not elsewhere classified  Pain in left elbow  Pain in left wrist  Muscle weakness (generalized)    Problem List Patient Active Problem List   Diagnosis Date Noted  . Humerus distal fracture 08/02/2015    Guadelupe Sabin, OTR/L  [phone]  10/09/2015, 2:06 PM  [place] [address] [place], [place], [address] Phone: [phone]   Fax:  [phone]  Name: Erica Mora MRN: [id number] Date of Birth: 1940-07-01

## 2015-10-09 NOTE — Patient Instructions (Signed)
Wrist and Elbow Stretches  1) Pronation Stretch Forearm supported on table with wrist in neutral position. Using a weight, roll wrist so that palm is now facing downward. Hold for 45 seconds.      2) Supination Stretch  Forearm supported on table with wrist in neutral position. Using a weight, roll wrist so that palm is now facing upward. Hold for 45 seconds.     3) Wrist extension stretch Start with wrist at edge of table, palm facing up. With wrist slightly off the edge of the table, hold for 45 seconds     4) Wrist Flexion Stretch Hold a small free weight, rest your forearm on a table and allow your wrist to hang down, hold for 45 seconds     5) Elbow Extension Stretch Standing with left arm at side, holding a 2 pound weight allow arm to relax and hang down working on elbow extension, 3X for 30 seconds each    AROM Exercises   1) Wrist Flexion  Start with wrist at edge of table, palm facing up. With wrist hanging slightly off table, curl wrist upward, and back down.      2) Wrist Extension  Start with wrist at edge of table, palm facing down. With wrist slightly off the edge of the table, curl wrist up and back down.      3) Radial Deviations  Start with forearm flat against a table, wrist hanging slightly off the edge, and palm facing the wall. Bending at the wrist only, and keeping palm facing the wall, bend wrist so fist is pointing towards the floor, back up to start position, and up towards the ceiling. Return to start.        4) WRIST PRONATION  Turn your forearm towards palm face down.  Keep your elbow bent and by the side of your  Body.      5) WRIST SUPINATION  Turn your forearm towards palm face up.  Keep your elbow bent and by the side of your  Body.      *Complete exercises _10_____ times each, ___1-2____ times per day*

## 2015-10-11 ENCOUNTER — Encounter (HOSPITAL_COMMUNITY): Payer: Self-pay | Admitting: Occupational Therapy

## 2015-10-11 ENCOUNTER — Ambulatory Visit (HOSPITAL_COMMUNITY): Payer: Medicare Other | Admitting: Occupational Therapy

## 2015-10-11 DIAGNOSIS — M7989 Other specified soft tissue disorders: Secondary | ICD-10-CM | POA: Diagnosis not present

## 2015-10-11 DIAGNOSIS — M25532 Pain in left wrist: Secondary | ICD-10-CM

## 2015-10-11 DIAGNOSIS — M6281 Muscle weakness (generalized): Secondary | ICD-10-CM

## 2015-10-11 DIAGNOSIS — M25632 Stiffness of left wrist, not elsewhere classified: Secondary | ICD-10-CM

## 2015-10-11 DIAGNOSIS — M25522 Pain in left elbow: Secondary | ICD-10-CM

## 2015-10-11 DIAGNOSIS — M25622 Stiffness of left elbow, not elsewhere classified: Secondary | ICD-10-CM

## 2015-10-11 DIAGNOSIS — R29898 Other symptoms and signs involving the musculoskeletal system: Secondary | ICD-10-CM

## 2015-10-11 NOTE — Therapy (Addendum)
[place], [place], [address] Phone: [phone]   Fax:  [phone]  Occupational Therapy Treatment  Patient Details  Name: Gilberto Streck MRN: [id number] Date of Birth: 06-13-1941 Referring Provider: Altamese [place], MD  Encounter Date: 10/11/2015      OT End of Session - 10/11/15 1431    Visit Number 17   Number of Visits 24   Date for OT Re-Evaluation 10/29/15   Authorization Type Medicare/Medicare A& B primary; [employer] secondary   Authorization Time Period Before 20th visit   Authorization - Visit Number 17   Authorization - Number of Visits 20   OT Start Time 1304   OT Stop Time 1345   OT Time Calculation (min) 41 min   Activity Tolerance Patient tolerated treatment well   Behavior During Therapy [facility] for tasks assessed/performed      Past Medical History  Diagnosis Date  . Medical history non-contributory     Past Surgical History  Procedure Laterality Date  . Vaginal delivery    . Tubal ligation    . Eye surgery      cataracts- bilateral    . Orif humerus fracture Left 08/02/2015    Procedure: OPEN REDUCTION INTERNAL FIXATION (ORIF)LEFT DISTAL RADIUS,HUMERUS FRACTURE;  Surgeon: Altamese [place], MD;  Location: [place];  Service: Orthopedics;  Laterality: Left;    There were no vitals filed for this visit.      Subjective Assessment - 10/11/15 1342    Subjective  S: I go back to the doctor next Wednesday.   Currently in Pain? No/denies                      OT Treatments/Exercises (OP) - 10/11/15 1327    Exercises   Exercises Elbow;Wrist;Shoulder;Hand   Elbow Exercises   Elbow Extension PROM;Strengthening;10 reps   Bar Weights/Barbell (Elbow Extension) 1 lb   Forearm Supination PROM;10 reps   Forearm Pronation PROM;10 reps   Wrist Flexion PROM;10 reps   Wrist Extension PROM;10 reps   Other elbow exercises PROM elbow flexion; 10X   Other elbow exercises elbow flexion  strengthening 10X 1# weight, elbow flexion stretch on mat table 3X30", elbow extension stretch using mat table 3X30"   Weighted Stretch Over Towel Roll   Supination - Weighted Stretch 2 pounds  45 seconds   Pronation - Weighted Stretch 2 pounds  45 seconds   Pronation Weighted Stretch Limitations     Wrist Flexion - Weighted Stretch 2 pounds  45 seconds   Wrist Extension - Weighted Stretch 2 pounds  45 seconds   Manual Therapy   Manual Therapy Myofascial release;Muscle Energy Technique   Manual therapy comments Manual therapy was completed prior to exercises.   Edema Management Edema management completed to left hand, wrist, and elbow region.    Myofascial Release Myofascial release and manual stretching to left hand/digits, wrist, elbow region to decrease fascial restrictions and increase joint mobility in a pain free zone.    Muscle Energy Technique Muscle energy technique to left elbow flexors and extensors and wrist flexors, extensors, supinators, and pronators to relax muscle spasms and increase joint mobility                  OT Short Term Goals - 09/30/15 1412    OT SHORT TERM GOAL #1   Title Pt will be provided with and educated on HEP.    Time 4   Period Weeks  Status On-going   OT SHORT TERM GOAL #2   Title Pt will decrease fascial restrictions from max to mod amounts to increase joint mobility of the LUE at the elbow and wrist.    Time 4   Period Weeks   Status Partially Met   OT SHORT TERM GOAL #3   Title Pt will decrease pain in LUE to 3/10 or less to increase ability to perform HEP   Time 4   Period Weeks   OT SHORT TERM GOAL #4   Title Pt will increase left elbow P/ROM to [facility] to increase ability use LUE as assist during dressing tasks.    Time 4   Period Weeks   Status On-going   OT SHORT TERM GOAL #5   Title Pt will increase left wrist P/ROM to [facility] to increase ability to use LUE as assist during grooming tasks.    Time 4   Period Weeks   Status  Partially Met   OT SHORT TERM GOAL #6   Title Pt will increase LUE strength to 3+/5 to increase ability to use LUE as assist when completing light housework tasks.    Time 4   Period Weeks           OT Long Term Goals - 09/27/15 1403    OT LONG TERM GOAL #1   Title Pt will return to prior level of functioning and independence during daily and leisure tasks.    Time 8   Period Weeks   Status On-going   OT LONG TERM GOAL #2   Title Pt will decrease fascial restrictions in LUE from mod to min amounts or less to increase mobility during functional use of LUE.    Time 8   Period Weeks   Status On-going   OT LONG TERM GOAL #3   Title Pt will decrease pain to 1/10 or less to increase ability to actively use  LUE during daily tasks.    Time 8   Period Weeks   Status On-going   OT LONG TERM GOAL #4   Title Pt will increase A/ROM of left elbow to [facility] to improve ability to wash and style hair independently.    Time 8   Period Weeks   Status On-going   OT LONG TERM GOAL #5   Title Pt will increase A/ROM of left wrist to [facility] to increase ability to functionally use LUE when preparing meals.    Time 8   Period Weeks   Status On-going   OT LONG TERM GOAL #6   Title Pt will increase LUE strength to 4/5 to increase ability to lift weighted objects such as pots and pans.   Time 8   Period Weeks   Status On-going               Plan - 10/11/15 1432    Clinical Impression Statement A: Continued with aggressive stretching this session, pt continues to be limited primarily with elbow flexion and extension with hard stop at end of range during passive stretching. Pt reports she can tell a difference in her active range since she began using the JAS brace.    Rehab Potential Good   OT Frequency 3x / week   OT Duration 8 weeks   OT Treatment/Interventions Self-care/ADL training;Ultrasound;DME and/or AE instruction;Scar mobilization;Passive range of motion;Patient/family  education;Cryotherapy;Electrical Stimulation;Splinting;Moist Heat;Therapeutic exercise;Manual Therapy;Therapeutic activities   Plan P: Take measurements for MD appt, continue with aggressive stretching-send to MD and ask MD  what range he is expecting pt to achieve   Consulted and Agree with Plan of Care Patient      Patient will benefit from skilled therapeutic intervention in order to improve the following deficits and impairments:  Decreased strength, Impaired sensation, Pain, Increased edema, Impaired UE functional use, Decreased range of motion, Increased fascial restricitons, Decreased skin integrity, Impaired flexibility, Decreased scar mobility  Visit Diagnosis: Other symptoms and signs involving the musculoskeletal system  Stiffness of left wrist, not elsewhere classified  Stiffness of left elbow, not elsewhere classified  Pain in left elbow  Pain in left wrist  Muscle weakness (generalized)    Problem List Patient Active Problem List   Diagnosis Date Noted  . Humerus distal fracture 08/02/2015    Guadelupe Sabin, OTR/L  [phone]  10/11/2015, 2:37 PM  [place], [place], [address] Phone: [phone]   Fax:  [phone]  Name: Safina Huard MRN: [id number] Date of Birth: 18-Oct-1940

## 2015-10-14 ENCOUNTER — Telehealth (HOSPITAL_COMMUNITY): Payer: Self-pay

## 2015-10-14 ENCOUNTER — Ambulatory Visit (HOSPITAL_COMMUNITY): Payer: Medicare Other

## 2015-10-14 NOTE — Telephone Encounter (Signed)
Her sister is having lung surgery today and she is at the hospital with the family, she will take this week day by day. NF

## 2015-10-16 ENCOUNTER — Encounter (HOSPITAL_COMMUNITY): Payer: Self-pay | Admitting: Occupational Therapy

## 2015-10-16 ENCOUNTER — Ambulatory Visit (HOSPITAL_COMMUNITY): Payer: Medicare Other | Attending: Orthopedic Surgery | Admitting: Occupational Therapy

## 2015-10-16 DIAGNOSIS — R29898 Other symptoms and signs involving the musculoskeletal system: Secondary | ICD-10-CM | POA: Diagnosis present

## 2015-10-16 DIAGNOSIS — M6281 Muscle weakness (generalized): Secondary | ICD-10-CM

## 2015-10-16 DIAGNOSIS — M25522 Pain in left elbow: Secondary | ICD-10-CM

## 2015-10-16 DIAGNOSIS — M25532 Pain in left wrist: Secondary | ICD-10-CM | POA: Diagnosis present

## 2015-10-16 DIAGNOSIS — M25632 Stiffness of left wrist, not elsewhere classified: Secondary | ICD-10-CM | POA: Diagnosis present

## 2015-10-16 DIAGNOSIS — M25622 Stiffness of left elbow, not elsewhere classified: Secondary | ICD-10-CM | POA: Diagnosis present

## 2015-10-16 NOTE — Therapy (Signed)
[place], [place], [address] Phone: [phone]   Fax:  [phone]  Occupational Therapy Treatment  Patient Details  Name: Erica Mora MRN: [id number] Date of Birth: 09/08/1940 Referring Provider: Altamese [place], MD  Encounter Date: 10/16/2015      OT End of Session - 10/16/15 1351    Visit Number 18   Number of Visits 24   Date for OT Re-Evaluation 10/29/15   Authorization Type Medicare/Medicare A& B primary; AARP secondary   Authorization Time Period Before 20th visit   Authorization - Visit Number 18   Authorization - Number of Visits 20   OT Start Time 1304   OT Stop Time 1345   OT Time Calculation (min) 41 min   Activity Tolerance Patient tolerated treatment well   Behavior During Therapy [facility] for tasks assessed/performed      Past Medical History  Diagnosis Date  . Medical history non-contributory     Past Surgical History  Procedure Laterality Date  . Vaginal delivery    . Tubal ligation    . Eye surgery      cataracts- bilateral    . Orif humerus fracture Left 08/02/2015    Procedure: OPEN REDUCTION INTERNAL FIXATION (ORIF)LEFT DISTAL RADIUS,HUMERUS FRACTURE;  Surgeon: Altamese [place], MD;  Location: [place];  Service: Orthopedics;  Laterality: Left;    There were no vitals filed for this visit.          [facility] OT Assessment - 10/16/15 1309    Assessment   Diagnosis s/p ORIF-left distal radius fx & left distal humerus fx   Precautions   Precautions Other (comment)   Precaution Comments Gentle P/ROM, AA/ROM, A/ROM progressing as tolerated, no resistance or strengthening for 8 weeks                  OT Treatments/Exercises (OP) - 10/16/15 1343    Exercises   Exercises Elbow;Wrist;Shoulder;Hand   Elbow Exercises   Elbow Extension PROM;10 reps;Strengthening;15 reps   Bar Weights/Barbell (Elbow Extension) 2 lbs   Forearm Supination PROM;10 reps   Forearm Pronation PROM;10  reps   Wrist Flexion PROM;10 reps   Wrist Extension PROM;10 reps   Other elbow exercises PROM elbow flexion, 10X; rolling therapy ball up wall working on elbow flexion/extension, wrist extension, shoulder flexion, 5X, mod difficulty   Other elbow exercises elbow flexion strengthening 10X 1# weight, elbow flexion stretch on mat table 3X30", elbow extension stretch using mat table 3X30", elbow extension stretch with shoulder against wall to prevent compensation pt stretching arm into extension with 3# weight 3X30"   Weighted Stretch Over Towel Roll   Supination - Weighted Stretch 2 pounds  45 seconds   Pronation - Weighted Stretch 2 pounds  45 seconds   Wrist Flexion - Weighted Stretch 2 pounds  45 seconds   Wrist Extension - Weighted Stretch 2 pounds  45 seconds   Manual Therapy   Manual Therapy Myofascial release;Muscle Energy Technique   Manual therapy comments Manual therapy was completed prior to exercises.   Myofascial Release Myofascial release and manual stretching to left hand/digits, wrist, elbow region to decrease fascial restrictions and increase joint mobility in a pain free zone.    Muscle Energy Technique Muscle energy technique to left elbow flexors and extensors and wrist flexors, extensors, supinators, and pronators to relax muscle spasms and increase joint mobility                  OT  Short Term Goals - 09/30/15 1412    OT SHORT TERM GOAL #1   Title Pt will be provided with and educated on HEP.    Time 4   Period Weeks   Status On-going   OT SHORT TERM GOAL #2   Title Pt will decrease fascial restrictions from max to mod amounts to increase joint mobility of the LUE at the elbow and wrist.    Time 4   Period Weeks   Status Partially Met   OT SHORT TERM GOAL #3   Title Pt will decrease pain in LUE to 3/10 or less to increase ability to perform HEP   Time 4   Period Weeks   OT SHORT TERM GOAL #4   Title Pt will increase left elbow P/ROM to [facility] to  increase ability use LUE as assist during dressing tasks.    Time 4   Period Weeks   Status On-going   OT SHORT TERM GOAL #5   Title Pt will increase left wrist P/ROM to [facility] to increase ability to use LUE as assist during grooming tasks.    Time 4   Period Weeks   Status Partially Met   OT SHORT TERM GOAL #6   Title Pt will increase LUE strength to 3+/5 to increase ability to use LUE as assist when completing light housework tasks.    Time 4   Period Weeks           OT Long Term Goals - 09/27/15 1403    OT LONG TERM GOAL #1   Title Pt will return to prior level of functioning and independence during daily and leisure tasks.    Time 8   Period Weeks   Status On-going   OT LONG TERM GOAL #2   Title Pt will decrease fascial restrictions in LUE from mod to min amounts or less to increase mobility during functional use of LUE.    Time 8   Period Weeks   Status On-going   OT LONG TERM GOAL #3   Title Pt will decrease pain to 1/10 or less to increase ability to actively use  LUE during daily tasks.    Time 8   Period Weeks   Status On-going   OT LONG TERM GOAL #4   Title Pt will increase A/ROM of left elbow to [facility] to improve ability to wash and style hair independently.    Time 8   Period Weeks   Status On-going   OT LONG TERM GOAL #5   Title Pt will increase A/ROM of left wrist to [facility] to increase ability to functionally use LUE when preparing meals.    Time 8   Period Weeks   Status On-going   OT LONG TERM GOAL #6   Title Pt will increase LUE strength to 4/5 to increase ability to lift weighted objects such as pots and pans.   Time 8   Period Weeks   Status On-going               Plan - 10/16/15 1351    Clinical Impression Statement A: Pt went to MD this morning, reports MD took x-rays and suspects calcification in the elbow joint preventing additional movement. Instructions are to continue with therapy for 4 weeks and then decide if pt would like MD to clean  out elbow. Continued aggressive stretching this session, increased elbow extension weight to 3#, resumed therapy ball exercises at wall. Pt reports she has been using  heating pad at night for increased tightness.    Rehab Potential Good   OT Frequency 3x / week   OT Duration 8 weeks   OT Treatment/Interventions Self-care/ADL training;Ultrasound;DME and/or AE instruction;Scar mobilization;Passive range of motion;Patient/family education;Cryotherapy;Electrical Stimulation;Splinting;Moist Heat;Therapeutic exercise;Manual Therapy;Therapeutic activities   Plan P: Continue with aggressive stretching, strengthening, add UBE if appropriate.    Consulted and Agree with Plan of Care Patient      Patient will benefit from skilled therapeutic intervention in order to improve the following deficits and impairments:  Decreased strength, Impaired sensation, Pain, Increased edema, Impaired UE functional use, Decreased range of motion, Increased fascial restricitons, Decreased skin integrity, Impaired flexibility, Decreased scar mobility  Visit Diagnosis: Stiffness of left wrist, not elsewhere classified  Other symptoms and signs involving the musculoskeletal system  Stiffness of left elbow, not elsewhere classified  Pain in left elbow  Pain in left wrist  Muscle weakness (generalized)    Problem List Patient Active Problem List   Diagnosis Date Noted  . Humerus distal fracture 08/02/2015    Guadelupe Sabin, OTR/L  [phone]  10/16/2015, 1:54 PM  [place] [address] [place], [place], [address] Phone: [phone]   Fax:  [phone]  Name: Erica Mora MRN: [id number] Date of Birth: 05-Feb-1941

## 2015-10-18 ENCOUNTER — Ambulatory Visit (HOSPITAL_COMMUNITY): Payer: Medicare Other | Admitting: Occupational Therapy

## 2015-10-18 ENCOUNTER — Encounter (HOSPITAL_COMMUNITY): Payer: Self-pay | Admitting: Occupational Therapy

## 2015-10-18 DIAGNOSIS — M25632 Stiffness of left wrist, not elsewhere classified: Secondary | ICD-10-CM

## 2015-10-18 DIAGNOSIS — M25532 Pain in left wrist: Secondary | ICD-10-CM

## 2015-10-18 DIAGNOSIS — M25622 Stiffness of left elbow, not elsewhere classified: Secondary | ICD-10-CM

## 2015-10-18 DIAGNOSIS — R29898 Other symptoms and signs involving the musculoskeletal system: Secondary | ICD-10-CM

## 2015-10-18 DIAGNOSIS — M6281 Muscle weakness (generalized): Secondary | ICD-10-CM

## 2015-10-18 DIAGNOSIS — M25522 Pain in left elbow: Secondary | ICD-10-CM

## 2015-10-18 NOTE — Therapy (Addendum)
[place], [place], [address] Phone: [phone]   Fax:  [phone]  Occupational Therapy Treatment  Patient Details  Name: Erica Mora MRN: [id number] Date of Birth: 1940/07/28 Referring Provider: Altamese [place], MD  Encounter Date: 10/18/2015      OT End of Session - 10/18/15 1455    Visit Number 19   Number of Visits 24   Date for OT Re-Evaluation 10/29/15   Authorization Type Medicare/Medicare A& B primary; AARP secondary   Authorization Time Period Before 20th visit   Authorization - Visit Number 19   Authorization - Number of Visits 20   OT Start Time 1301   OT Stop Time 1347   OT Time Calculation (min) 46 min   Activity Tolerance Patient tolerated treatment well   Behavior During Therapy [facility] for tasks assessed/performed      Past Medical History  Diagnosis Date  . Medical history non-contributory     Past Surgical History  Procedure Laterality Date  . Vaginal delivery    . Tubal ligation    . Eye surgery      cataracts- bilateral    . Orif humerus fracture Left 08/02/2015    Procedure: OPEN REDUCTION INTERNAL FIXATION (ORIF)LEFT DISTAL RADIUS,HUMERUS FRACTURE;  Surgeon: Altamese [place], MD;  Location: [place];  Service: Orthopedics;  Laterality: Left;    There were no vitals filed for this visit.      Subjective Assessment - 10/18/15 1304    Subjective  S: I can get a cup to my mouth with my left hand now.    Currently in Pain? No/denies            [facility] OT Assessment - 10/18/15 1303    Assessment   Diagnosis s/p ORIF-left distal radius fx & left distal humerus fx   Precautions   Precautions Other (comment)   Precaution Comments Gentle P/ROM, AA/ROM, A/ROM progressing as tolerated, no resistance or strengthening for 8 weeks                  OT Treatments/Exercises (OP) - 10/18/15 1449    Exercises   Exercises Elbow;Wrist;Shoulder;Hand   Elbow Exercises   Elbow  Extension PROM;10 reps;Strengthening;15 reps   Bar Weights/Barbell (Elbow Extension) 2 lbs   Forearm Supination PROM;10 reps   Forearm Pronation PROM;10 reps   Wrist Flexion PROM;Strengthening;10 reps   Bar Weights/Barbell (Wrist Flexion) 2 lbs   Wrist Extension PROM;Strengthening;10 reps   Bar Weights/Barbell (Wrist Extension) 2 lbs   Other elbow exercises PROM elbow flexion, 10X; rolling therapy ball up wall working on elbow flexion/extension, wrist extension, shoulder flexion, 5X, mod difficulty   Other elbow exercises elbow flexion strengthening 10X 1# weight, elbow flexion stretch on mat table 3X30", elbow extension stretch using mat table 3X45", elbow extension stretch with shoulder against wall to prevent compensation pt stretching arm into extension with 3# weight 3X30"   Weighted Stretch Over Towel Roll   Wrist Flexion - Weighted Stretch 2 pounds  45 seconds   Wrist Extension - Weighted Stretch 2 pounds  45 seconds   Manual Therapy   Manual Therapy Myofascial release;Muscle Energy Technique   Manual therapy comments Manual therapy was completed prior to exercises.   Myofascial Release Myofascial release and manual stretching to left hand/digits, wrist, elbow region to decrease fascial restrictions and increase joint mobility in a pain free zone.    Muscle Energy Technique Muscle energy technique to left elbow flexors and extensors and  wrist flexors, extensors, supinators, and pronators to relax muscle spasms and increase joint mobility                  OT Short Term Goals - 09/30/15 1412    OT SHORT TERM GOAL #1   Title Pt will be provided with and educated on HEP.    Time 4   Period Weeks   Status On-going   OT SHORT TERM GOAL #2   Title Pt will decrease fascial restrictions from max to mod amounts to increase joint mobility of the LUE at the elbow and wrist.    Time 4   Period Weeks   Status Partially Met   OT SHORT TERM GOAL #3   Title Pt will decrease pain in  LUE to 3/10 or less to increase ability to perform HEP   Time 4   Period Weeks   OT SHORT TERM GOAL #4   Title Pt will increase left elbow P/ROM to [facility] to increase ability use LUE as assist during dressing tasks.    Time 4   Period Weeks   Status On-going   OT SHORT TERM GOAL #5   Title Pt will increase left wrist P/ROM to [facility] to increase ability to use LUE as assist during grooming tasks.    Time 4   Period Weeks   Status Partially Met   OT SHORT TERM GOAL #6   Title Pt will increase LUE strength to 3+/5 to increase ability to use LUE as assist when completing light housework tasks.    Time 4   Period Weeks           OT Long Term Goals - 09/27/15 1403    OT LONG TERM GOAL #1   Title Pt will return to prior level of functioning and independence during daily and leisure tasks.    Time 8   Period Weeks   Status On-going   OT LONG TERM GOAL #2   Title Pt will decrease fascial restrictions in LUE from mod to min amounts or less to increase mobility during functional use of LUE.    Time 8   Period Weeks   Status On-going   OT LONG TERM GOAL #3   Title Pt will decrease pain to 1/10 or less to increase ability to actively use  LUE during daily tasks.    Time 8   Period Weeks   Status On-going   OT LONG TERM GOAL #4   Title Pt will increase A/ROM of left elbow to [facility] to improve ability to wash and style hair independently.    Time 8   Period Weeks   Status On-going   OT LONG TERM GOAL #5   Title Pt will increase A/ROM of left wrist to [facility] to increase ability to functionally use LUE when preparing meals.    Time 8   Period Weeks   Status On-going   OT LONG TERM GOAL #6   Title Pt will increase LUE strength to 4/5 to increase ability to lift weighted objects such as pots and pans.   Time 8   Period Weeks   Status On-going               Plan - 10/18/15 1455    Clinical Impression Statement A: Added wrist strengthening exercises, increased elbow extension stretch  to 45". Pt reports she is now able to touch her mouth with her thumb and can drink from a cup using the left  hand/arm to lift cup. Pt is continuing to use JAS braces at home,    Rehab Potential Good   OT Frequency 3x / week   OT Duration 8 weeks   OT Treatment/Interventions Self-care/ADL training;Ultrasound;DME and/or AE instruction;Scar mobilization;Passive range of motion;Patient/family education;Cryotherapy;Electrical Stimulation;Splinting;Moist Heat;Therapeutic exercise;Manual Therapy;Therapeutic activities   Plan P: Update G-Code. Continue with aggressive stretching, add UBE if appropriate/able to tolerate    Consulted and Agree with Plan of Care Patient      Patient will benefit from skilled therapeutic intervention in order to improve the following deficits and impairments:  Decreased strength, Impaired sensation, Pain, Increased edema, Impaired UE functional use, Decreased range of motion, Increased fascial restricitons, Decreased skin integrity, Impaired flexibility, Decreased scar mobility  Visit Diagnosis: Stiffness of left wrist, not elsewhere classified  Other symptoms and signs involving the musculoskeletal system  Stiffness of left elbow, not elsewhere classified  Pain in left elbow  Pain in left wrist  Muscle weakness (generalized)    Problem List Patient Active Problem List   Diagnosis Date Noted  . Humerus distal fracture 08/02/2015    Guadelupe Sabin, OTR/L  [phone]  10/18/2015, 4:13 PM  [place] [address] [place], [place], [address] Phone: [phone]   Fax:  [phone]  Name: Elif Yonts MRN: [id number] Date of Birth: [birth year]

## 2015-10-21 ENCOUNTER — Ambulatory Visit (HOSPITAL_COMMUNITY): Payer: Medicare Other | Admitting: Specialist

## 2015-10-21 DIAGNOSIS — M25522 Pain in left elbow: Secondary | ICD-10-CM

## 2015-10-21 DIAGNOSIS — M25622 Stiffness of left elbow, not elsewhere classified: Secondary | ICD-10-CM

## 2015-10-21 DIAGNOSIS — M25632 Stiffness of left wrist, not elsewhere classified: Secondary | ICD-10-CM | POA: Diagnosis not present

## 2015-10-21 DIAGNOSIS — R29898 Other symptoms and signs involving the musculoskeletal system: Secondary | ICD-10-CM

## 2015-10-21 NOTE — Therapy (Signed)
[place], [place], [address] Phone: [phone]   Fax:  [phone]  Occupational Therapy Treatment  Patient Details  Name: Erica Mora MRN: [id number] Date of Birth: [birth year] Referring Provider: Altamese Odessa, MD  Encounter Date: 10/21/2015      OT End of Session - 10/21/15 1432    Visit Number 20   Number of Visits 24   Date for OT Re-Evaluation 10/29/15   Authorization Type Medicare/Medicare A& B primary; [employer] secondary   Authorization Time Period Before 30th visit   Authorization - Visit Number 78   Authorization - Number of Visits 30   OT Start Time 1305   OT Stop Time 1345   OT Time Calculation (min) 40 min   Activity Tolerance Patient tolerated treatment well   Behavior During Therapy [facility] for tasks assessed/performed      Past Medical History  Diagnosis Date  . Medical history non-contributory     Past Surgical History  Procedure Laterality Date  . Vaginal delivery    . Tubal ligation    . Eye surgery      cataracts- bilateral    . Orif humerus fracture Left 08/02/2015    Procedure: OPEN REDUCTION INTERNAL FIXATION (ORIF)LEFT DISTAL RADIUS,HUMERUS FRACTURE;  Surgeon: Altamese Surry, MD;  Location: [place];  Service: Orthopedics;  Laterality: Left;    There were no vitals filed for this visit.      Subjective Assessment - 10/21/15 1429    Subjective  S:  I can't get my earring in my right ear.   Currently in Pain? No/denies            [facility] OT Assessment - 10/21/15 0001    Assessment   Diagnosis s/p ORIF-left distal radius fx & left distal humerus fx   Precautions   Precautions Other (comment)   Precaution Comments Gentle P/ROM, AA/ROM, A/ROM progressing as tolerated, no resistance or strengthening for 8 weeks                  OT Treatments/Exercises (OP) - 10/21/15 0001    Exercises   Exercises Elbow;Wrist;Shoulder;Hand   Shoulder Exercises: ROM/Strengthening   UBE (Upper Arm Bike) 2' forward and 2' reverse at 1.0 resistance   Elbow Exercises   Elbow Extension PROM;10 reps;Strengthening;15 reps   Bar Weights/Barbell (Elbow Extension) 2 lbs   Forearm Supination PROM;10 reps   Forearm Pronation PROM;10 reps   Wrist Flexion PROM;Strengthening;10 reps   Bar Weights/Barbell (Wrist Flexion) 2 lbs   Wrist Extension PROM;Strengthening;10 reps   Bar Weights/Barbell (Wrist Extension) 2 lbs   Manual Therapy   Manual Therapy Myofascial release   Manual therapy comments Manual therapy was completed prior to exercises.   Myofascial Release Myofascial release and manual stretching to left hand/digits, wrist, elbow region to decrease fascial restrictions and increase joint mobility in a pain free zone.                   OT Short Term Goals - 09/30/15 1412    OT SHORT TERM GOAL #1   Title Pt will be provided with and educated on HEP.    Time 4   Period Weeks   Status On-going   OT SHORT TERM GOAL #2   Title Pt will decrease fascial restrictions from max to mod amounts to increase joint mobility of the LUE at the elbow and wrist.    Time 4   Period Weeks   Status Partially Met  OT SHORT TERM GOAL #3   Title Pt will decrease pain in LUE to 3/10 or less to increase ability to perform HEP   Time 4   Period Weeks   OT SHORT TERM GOAL #4   Title Pt will increase left elbow P/ROM to [facility] to increase ability use LUE as assist during dressing tasks.    Time 4   Period Weeks   Status On-going   OT SHORT TERM GOAL #5   Title Pt will increase left wrist P/ROM to [facility] to increase ability to use LUE as assist during grooming tasks.    Time 4   Period Weeks   Status Partially Met   OT SHORT TERM GOAL #6   Title Pt will increase LUE strength to 3+/5 to increase ability to use LUE as assist when completing light housework tasks.    Time 4   Period Weeks           OT Long Term Goals - 09/27/15 1403    OT LONG TERM GOAL #1   Title Pt will return  to prior level of functioning and independence during daily and leisure tasks.    Time 8   Period Weeks   Status On-going   OT LONG TERM GOAL #2   Title Pt will decrease fascial restrictions in LUE from mod to min amounts or less to increase mobility during functional use of LUE.    Time 8   Period Weeks   Status On-going   OT LONG TERM GOAL #3   Title Pt will decrease pain to 1/10 or less to increase ability to actively use  LUE during daily tasks.    Time 8   Period Weeks   Status On-going   OT LONG TERM GOAL #4   Title Pt will increase A/ROM of left elbow to [facility] to improve ability to wash and style hair independently.    Time 8   Period Weeks   Status On-going   OT LONG TERM GOAL #5   Title Pt will increase A/ROM of left wrist to [facility] to increase ability to functionally use LUE when preparing meals.    Time 8   Period Weeks   Status On-going   OT LONG TERM GOAL #6   Title Pt will increase LUE strength to 4/5 to increase ability to lift weighted objects such as pots and pans.   Time 8   Period Weeks   Status On-going               Plan - 30-Oct-2015 1433    Clinical Impression Statement A: Patient required assistance with positioning for wrist strengthening with 2 pounds.  added upper extremity bike for 2' forward and reverse at 1.0  Patient felt a good stretch from this and a good challenge.     Plan P:  reassess, continue aggressive stretching increase time to 6 minutes on UBE. complete FOTO.      Patient will benefit from skilled therapeutic intervention in order to improve the following deficits and impairments:  Decreased strength, Impaired sensation, Pain, Increased edema, Impaired UE functional use, Decreased range of motion, Increased fascial restricitons, Decreased skin integrity, Impaired flexibility, Decreased scar mobility  Visit Diagnosis: Stiffness of left elbow, not elsewhere classified  Pain in left elbow  Other symptoms and signs involving the  musculoskeletal system      G-Codes - 10/30/2015 1435    Functional Assessment Tool Used clinical judgement    Functional Limitation Carrying,  moving and handling objects   Carrying, Moving and Handling Objects Current Status [phone]) At least 40 percent but less than 60 percent impaired, limited or restricted   Carrying, Moving and Handling Objects Goal Status ([id number]) At least 20 percent but less than 40 percent impaired, limited or restricted      Problem List Patient Active Problem List   Diagnosis Date Noted  . Humerus distal fracture 08/02/2015    Vangie Bicker, OTR/L [phone]  10/21/2015, 2:45 PM  [place] [address] [place], [place], [address] Phone: [phone]   Fax:  [phone]  Name: Erica Mora MRN: [id number] Date of Birth: 10/10/1940

## 2015-10-23 ENCOUNTER — Encounter (HOSPITAL_COMMUNITY): Payer: Self-pay | Admitting: Occupational Therapy

## 2015-10-23 ENCOUNTER — Ambulatory Visit (HOSPITAL_COMMUNITY): Payer: Medicare Other | Admitting: Occupational Therapy

## 2015-10-23 DIAGNOSIS — M25632 Stiffness of left wrist, not elsewhere classified: Secondary | ICD-10-CM | POA: Diagnosis not present

## 2015-10-23 DIAGNOSIS — R29898 Other symptoms and signs involving the musculoskeletal system: Secondary | ICD-10-CM

## 2015-10-23 DIAGNOSIS — M25622 Stiffness of left elbow, not elsewhere classified: Secondary | ICD-10-CM

## 2015-10-23 NOTE — Therapy (Signed)
[place], [place], [address] Phone: [phone]   Fax:  [phone]  Occupational Therapy Reassessment and Treatment  Patient Details  Name: Erica Mora MRN: [id number] Date of Birth: Jul 21, 1940 Referring Provider: Altamese Groom, MD  Encounter Date: 10/23/2015      OT End of Session - 10/23/15 1356    Visit Number 21   Number of Visits 24   Date for OT Re-Evaluation 11/08/15   Authorization Type Medicare/Medicare A& B primary; AARP secondary   Authorization Time Period Before 30th visit   Authorization - Visit Number 21   Authorization - Number of Visits 30   OT Start Time 1302   OT Stop Time 1345   OT Time Calculation (min) 43 min   Activity Tolerance Patient tolerated treatment well   Behavior During Therapy [facility] for tasks assessed/performed      Past Medical History  Diagnosis Date  . Medical history non-contributory     Past Surgical History  Procedure Laterality Date  . Vaginal delivery    . Tubal ligation    . Eye surgery      cataracts- bilateral    . Orif humerus fracture Left 08/02/2015    Procedure: OPEN REDUCTION INTERNAL FIXATION (ORIF)LEFT DISTAL RADIUS,HUMERUS FRACTURE;  Surgeon: Altamese McBain, MD;  Location: [place];  Service: Orthopedics;  Laterality: Left;    There were no vitals filed for this visit.      Subjective Assessment - 10/23/15 1304    Subjective  S: I can carry things and lift things, it just will not move the way I want it to sometimes.    Special Tests FOTO Score: 62/100 (38% impairment)   Currently in Pain? No/denies           [facility] OT Assessment - 10/23/15 1303    Assessment   Diagnosis s/p ORIF-left distal radius fx & left distal humerus fx   Precautions   Precautions Other (comment)   Precaution Comments Gentle P/ROM, AA/ROM, A/ROM progressing as tolerated, no resistance or strengthening for 8 weeks   Palpation   Palpation comment Pt with min/mod  fascial restrictions along left wrist, dorsal and volar forearm, elbow, and upper arm regions   AROM   Overall AROM Comments Assessed seated, ER/IR adducted   AROM Assessment Site Elbow;Forearm;Wrist   Right/Left Shoulder Left   Right/Left Elbow Left   Left Elbow Flexion 105  100 previous   Left Elbow Extension 45  50 previous   Right/Left Forearm Left   Left Forearm Pronation 82 Degrees  72 previous   Left Forearm Supination 89 Degrees  80 previous   Right/Left Wrist Left   Left Wrist Extension 40 Degrees  30 previous   Left Wrist Flexion 32 Degrees  same as previous   Left Wrist Radial Deviation 15 Degrees  same as previous   Left Wrist Ulnar Deviation 20 Degrees  same as previous   PROM   Overall PROM Comments Assessed seated   PROM Assessment Site Elbow;Forearm;Wrist   Right/Left Shoulder Left   Right/Left Elbow Left   Left Elbow Flexion 113  112 previous   Left Elbow Extension 38  44 previous   Right/Left Forearm Left   Left Forearm Pronation 90 Degrees  same as previous   Left Forearm Supination 90 Degrees  same as previous   Right/Left Wrist Left   Left Wrist Extension 50 Degrees  44 previous   Left Wrist Flexion 56 Degrees  45 previous  Left Wrist Radial Deviation 15 Degrees  same as previous   Left Wrist Ulnar Deviation 20 Degrees  same as previous   Strength   Strength Assessment Site Elbow;Forearm;Wrist   Right/Left Elbow Left   Left Elbow Flexion 4-/5  3-/5 previous   Left Elbow Extension 3+/5  3-/5 previous   Right/Left Forearm Left   Left Forearm Pronation 3+/5  3/5 previous   Left Forearm Supination 3+/5  3/5 previous   Right/Left Wrist Left   Left Wrist Flexion 3+/5  same as previous   Left Wrist Extension 3+/5  same as previous   Left Wrist Radial Deviation 3+/5  3/5 previous   Left Wrist Ulnar Deviation 3+/5  3/5 previous                  OT Treatments/Exercises (OP) - 10/23/15 1342    Exercises   Exercises  Elbow;Wrist;Shoulder;Hand   Shoulder Exercises: ROM/Strengthening   UBE (Upper Arm Bike) 3' forward and 3' reverse at 1.0 resistance  cuing for speed and direction; focused on elbow flex/ext   Elbow Exercises   Elbow Extension PROM;10 reps   Forearm Supination PROM;10 reps   Forearm Pronation PROM;10 reps   Wrist Flexion PROM;10 reps   Wrist Extension PROM;10 reps   Other elbow exercises PROM elbow flexion, 10X;    Other elbow exercises elbow flexion stretch on mat table 3X30", elbow extension stretch using mat table 3X45"   Manual Therapy   Manual Therapy Myofascial release   Manual therapy comments Manual therapy was completed prior to exercises.   Myofascial Release Myofascial release and manual stretching to left hand/digits, wrist, elbow region to decrease fascial restrictions and increase joint mobility in a pain free zone.                  OT Short Term Goals - 10/23/15 1353    OT SHORT TERM GOAL #1   Title Pt will be provided with and educated on HEP.    Time 4   Period Weeks   Status Achieved   OT SHORT TERM GOAL #2   Title Pt will decrease fascial restrictions from max to mod amounts to increase joint mobility of the LUE at the elbow and wrist.    Time 4   Period Weeks   Status Achieved   OT SHORT TERM GOAL #3   Title Pt will decrease pain in LUE to 3/10 or less to increase ability to perform HEP   Time 4   Period Weeks   OT SHORT TERM GOAL #4   Title Pt will increase left elbow P/ROM to [facility] to increase ability use LUE as assist during dressing tasks.    Time 4   Period Weeks   Status On-going   OT SHORT TERM GOAL #5   Title Pt will increase left wrist P/ROM to [facility] to increase ability to use LUE as assist during grooming tasks.    Time 4   Period Weeks   Status Achieved   OT SHORT TERM GOAL #6   Title Pt will increase LUE strength to 3+/5 to increase ability to use LUE as assist when completing light housework tasks.    Time 4   Period Weeks   Status  Partially Met           OT Long Term Goals - 10/23/15 1353    OT LONG TERM GOAL #1   Title Pt will return to prior level of functioning and independence during daily  and leisure tasks.    Time 8   Period Weeks   Status On-going   OT LONG TERM GOAL #2   Title Pt will decrease fascial restrictions in LUE from mod to min amounts or less to increase mobility during functional use of LUE.    Time 8   Period Weeks   Status Partially Met   OT LONG TERM GOAL #3   Title Pt will decrease pain to 1/10 or less to increase ability to actively use  LUE during daily tasks.    Time 8   Period Weeks   Status Achieved   OT LONG TERM GOAL #4   Title Pt will increase A/ROM of left elbow to [facility] to improve ability to wash and style hair independently.    Time 8   Period Weeks   Status On-going   OT LONG TERM GOAL #5   Title Pt will increase A/ROM of left wrist to [facility] to increase ability to functionally use LUE when preparing meals.    Time 8   Period Weeks   Status On-going   OT LONG TERM GOAL #6   Title Pt will increase LUE strength to 4/5 to increase ability to lift weighted objects such as pots and pans.   Time 8   Period Weeks   Status On-going               Plan - 10/23/15 1356    Clinical Impression Statement A: Reassessment completed this session, pt has met 4/6 STGs, 1/5 LTGs, and has partially met 1 additional STG and 1 additional LTG.  Pt has made slight improvements in P/ROM, A/ROM, and strength since previous reassessment. Pt continues to wear JAS brace at home, is uncertain if the brace is assisting with improvements in ROM. Discussed progress and measurements with pt, pt has elected to continue therapy for the remaining 2 weeks before her next MD appt.    Rehab Potential Good   OT Frequency 2x / week   OT Duration 2 weeks   OT Treatment/Interventions Self-care/ADL training;Ultrasound;DME and/or AE instruction;Scar mobilization;Passive range of motion;Patient/family  education;Cryotherapy;Electrical Stimulation;Splinting;Moist Heat;Therapeutic exercise;Manual Therapy;Therapeutic activities   Plan P: Continue with therapy 2x/week for next 2 weeks, focusing on aggressive stretching to elbow, wrist, and forearm, add functional reaching tasks working on elbow flexion/extension, continue with weighted wrist stretches and strengthening. Update HEP as necessary.    Consulted and Agree with Plan of Care Patient      Patient will benefit from skilled therapeutic intervention in order to improve the following deficits and impairments:  Decreased strength, Impaired sensation, Pain, Increased edema, Impaired UE functional use, Decreased range of motion, Increased fascial restricitons, Decreased skin integrity, Impaired flexibility, Decreased scar mobility  Visit Diagnosis: Stiffness of left elbow, not elsewhere classified  Other symptoms and signs involving the musculoskeletal system  Stiffness of left wrist, not elsewhere classified    Problem List Patient Active Problem List   Diagnosis Date Noted  . Humerus distal fracture 08/02/2015    Guadelupe Sabin, OTR/L  [phone]  10/23/2015, 2:10 PM  [place], [place], [address] Phone: [phone]   Fax:  [phone]  Name: Rhyse Skowron MRN: [id number] Date of Birth: [birth year]

## 2015-10-25 ENCOUNTER — Encounter (HOSPITAL_COMMUNITY): Payer: Medicare Other | Admitting: Occupational Therapy

## 2015-10-28 ENCOUNTER — Ambulatory Visit (HOSPITAL_COMMUNITY): Payer: Medicare Other | Admitting: Specialist

## 2015-10-28 DIAGNOSIS — R29898 Other symptoms and signs involving the musculoskeletal system: Secondary | ICD-10-CM

## 2015-10-28 DIAGNOSIS — M25622 Stiffness of left elbow, not elsewhere classified: Secondary | ICD-10-CM

## 2015-10-28 DIAGNOSIS — M25632 Stiffness of left wrist, not elsewhere classified: Secondary | ICD-10-CM

## 2015-10-28 DIAGNOSIS — M25522 Pain in left elbow: Secondary | ICD-10-CM

## 2015-10-28 DIAGNOSIS — M25532 Pain in left wrist: Secondary | ICD-10-CM

## 2015-10-28 NOTE — Therapy (Signed)
[place], [place], [address] Phone: [phone]   Fax:  [phone]  Occupational Therapy Treatment  Patient Details  Name: Erica Mora MRN: [id number] Date of Birth: 11/12/1940 Referring Provider: Altamese Higbee, MD  Encounter Date: 10/28/2015      OT End of Session - 10/28/15 1517    Visit Number 22   Number of Visits 24   Date for OT Re-Evaluation 11/08/15   Authorization Type Medicare/Medicare A& B primary; AARP secondary   Authorization Time Period Before 30th visit   Authorization - Visit Number 22   Authorization - Number of Visits 30   OT Start Time 1313   OT Stop Time 1352   OT Time Calculation (min) 39 min   Activity Tolerance Patient tolerated treatment well   Behavior During Therapy [facility] for tasks assessed/performed      Past Medical History  Diagnosis Date  . Medical history non-contributory     Past Surgical History  Procedure Laterality Date  . Vaginal delivery    . Tubal ligation    . Eye surgery      cataracts- bilateral    . Orif humerus fracture Left 08/02/2015    Procedure: OPEN REDUCTION INTERNAL FIXATION (ORIF)LEFT DISTAL RADIUS,HUMERUS FRACTURE;  Surgeon: Altamese Westley, MD;  Location: [place];  Service: Orthopedics;  Laterality: Left;    There were no vitals filed for this visit.      Subjective Assessment - 10/28/15 1513    Subjective  S:  I think that really did a good stretch (referring to 5# weighted stretch)   Currently in Pain? No/denies            [facility] OT Assessment - 10/28/15 0001    Assessment   Diagnosis s/p ORIF-left distal radius fx & left distal humerus fx   Precautions   Precautions Other (comment)   Precaution Comments Gentle P/ROM, AA/ROM, A/ROM progressing as tolerated, no resistance or strengthening for 8 weeks                  OT Treatments/Exercises (OP) - 10/28/15 0001    Exercises   Exercises Elbow;Wrist;Shoulder;Hand   Elbow  Exercises   Other elbow exercises supine weighted stretch into extension with 5# for 1 minute 3 repetitions and then into flexion 5# 1 minute stretch for 3 repetitions    Other elbow exercises PVC slide to focus on flexion and extension of elbow 5 times each   Additional Elbow Exercises   UBE (Upper Arm Bike) 3' forward and 3' reverse at 1.0   Manual Therapy   Manual Therapy Myofascial release   Manual therapy comments Manual therapy was completed prior to exercises.   Myofascial Release Myofascial release and manual stretching to left hand/digits, wrist, elbow region to decrease fascial restrictions and increase joint mobility in a pain free zone.                   OT Short Term Goals - 10/23/15 1353    OT SHORT TERM GOAL #1   Title Pt will be provided with and educated on HEP.    Time 4   Period Weeks   Status Achieved   OT SHORT TERM GOAL #2   Title Pt will decrease fascial restrictions from max to mod amounts to increase joint mobility of the LUE at the elbow and wrist.    Time 4   Period Weeks   Status Achieved   OT SHORT TERM  GOAL #3   Title Pt will decrease pain in LUE to 3/10 or less to increase ability to perform HEP   Time 4   Period Weeks   OT SHORT TERM GOAL #4   Title Pt will increase left elbow P/ROM to [facility] to increase ability use LUE as assist during dressing tasks.    Time 4   Period Weeks   Status On-going   OT SHORT TERM GOAL #5   Title Pt will increase left wrist P/ROM to [facility] to increase ability to use LUE as assist during grooming tasks.    Time 4   Period Weeks   Status Achieved   OT SHORT TERM GOAL #6   Title Pt will increase LUE strength to 3+/5 to increase ability to use LUE as assist when completing light housework tasks.    Time 4   Period Weeks   Status Partially Met           OT Long Term Goals - 10/23/15 1353    OT LONG TERM GOAL #1   Title Pt will return to prior level of functioning and independence during daily and leisure  tasks.    Time 8   Period Weeks   Status On-going   OT LONG TERM GOAL #2   Title Pt will decrease fascial restrictions in LUE from mod to min amounts or less to increase mobility during functional use of LUE.    Time 8   Period Weeks   Status Partially Met   OT LONG TERM GOAL #3   Title Pt will decrease pain to 1/10 or less to increase ability to actively use  LUE during daily tasks.    Time 8   Period Weeks   Status Achieved   OT LONG TERM GOAL #4   Title Pt will increase A/ROM of left elbow to [facility] to improve ability to wash and style hair independently.    Time 8   Period Weeks   Status On-going   OT LONG TERM GOAL #5   Title Pt will increase A/ROM of left wrist to [facility] to increase ability to functionally use LUE when preparing meals.    Time 8   Period Weeks   Status On-going   OT LONG TERM GOAL #6   Title Pt will increase LUE strength to 4/5 to increase ability to lift weighted objects such as pots and pans.   Time 8   Period Weeks   Status On-going               Plan - 10/28/15 1625    Clinical Impression Statement A:  patient felt a good stretch in her left elbow with weighted stretch.   Plan P:  continue 5# weighted stretch.  focus on stretching and functional reaching activities.    Consulted and Agree with Plan of Care Patient      Patient will benefit from skilled therapeutic intervention in order to improve the following deficits and impairments:     Visit Diagnosis: Stiffness of left elbow, not elsewhere classified  Other symptoms and signs involving the musculoskeletal system  Stiffness of left wrist, not elsewhere classified  Pain in left elbow  Pain in left wrist    Problem List Patient Active Problem List   Diagnosis Date Noted  . Humerus distal fracture 08/02/2015    Vangie Bicker, OTR/L [phone]  10/28/2015, 4:29 PM  [place], [place], [address] Phone:  [phone]   Fax:  [phone]  Name: Erica Mora MRN: [id number] Date of Birth: June 13, 1941

## 2015-10-30 ENCOUNTER — Encounter (HOSPITAL_COMMUNITY): Payer: Self-pay | Admitting: Occupational Therapy

## 2015-10-30 ENCOUNTER — Ambulatory Visit (HOSPITAL_COMMUNITY): Payer: Medicare Other | Admitting: Occupational Therapy

## 2015-10-30 DIAGNOSIS — R29898 Other symptoms and signs involving the musculoskeletal system: Secondary | ICD-10-CM

## 2015-10-30 DIAGNOSIS — M25622 Stiffness of left elbow, not elsewhere classified: Secondary | ICD-10-CM

## 2015-10-30 DIAGNOSIS — M25632 Stiffness of left wrist, not elsewhere classified: Secondary | ICD-10-CM | POA: Diagnosis not present

## 2015-10-30 NOTE — Therapy (Signed)
[place], [place], [address] Phone: [phone]   Fax:  [phone]  Occupational Therapy Treatment  Patient Details  Name: Erica Mora MRN: [id number] Date of Birth: 1941/06/01 Referring Provider: Altamese [place], MD  Encounter Date: 10/30/2015      OT End of Session - 10/30/15 1446    Visit Number 23   Number of Visits 24   Date for OT Re-Evaluation 11/08/15   Authorization Type Medicare/Medicare A& B primary; AARP secondary   Authorization Time Period Before 30th visit   Authorization - Visit Number 23   Authorization - Number of Visits 30   OT Start Time 1312   OT Stop Time 1345   OT Time Calculation (min) 33 min   Activity Tolerance Patient tolerated treatment well   Behavior During Therapy [facility] for tasks assessed/performed      Past Medical History  Diagnosis Date  . Medical history non-contributory     Past Surgical History  Procedure Laterality Date  . Vaginal delivery    . Tubal ligation    . Eye surgery      cataracts- bilateral    . Orif humerus fracture Left 08/02/2015    Procedure: OPEN REDUCTION INTERNAL FIXATION (ORIF)LEFT DISTAL RADIUS,HUMERUS FRACTURE;  Surgeon: Altamese [place], MD;  Location: [place];  Service: Orthopedics;  Laterality: Left;    There were no vitals filed for this visit.      Subjective Assessment - 10/30/15 1316    Subjective  S: I'm about fed up with this elbow.    Currently in Pain? No/denies            [facility] OT Assessment - 10/30/15 1315    Assessment   Diagnosis s/p ORIF-left distal radius fx & left distal humerus fx   Precautions   Precautions Other (comment)   Precaution Comments Gentle P/ROM, AA/ROM, A/ROM progressing as tolerated, no resistance or strengthening for 8 weeks                  OT Treatments/Exercises (OP) - 10/30/15 1316    Exercises   Exercises Elbow;Wrist;Shoulder;Hand   Elbow Exercises   Elbow Extension PROM;10  reps   Forearm Supination PROM;10 reps   Forearm Pronation PROM;10 reps   Wrist Flexion PROM;10 reps   Wrist Extension PROM;10 reps   Other elbow exercises P/ROM elbow flexion 10X   Other elbow exercises elbow flexion stretch on mat table 2X45", weighted elbow extension stretch with shoulder against wall 3X1', 4# weight   Additional Elbow Exercises   UBE (Upper Arm Bike) 3' forward and 3' reverse at 1.0  verbal cuing for direction and speed   Manual Therapy   Manual Therapy Myofascial release   Manual therapy comments Manual therapy was completed prior to exercises.   Myofascial Release Myofascial release and manual stretching to left hand/digits, wrist, elbow region to decrease fascial restrictions and increase joint mobility in a pain free zone.                   OT Short Term Goals - 10/23/15 1353    OT SHORT TERM GOAL #1   Title Pt will be provided with and educated on HEP.    Time 4   Period Weeks   Status Achieved   OT SHORT TERM GOAL #2   Title Pt will decrease fascial restrictions from max to mod amounts to increase joint mobility of the LUE at the elbow and wrist.  Time 4   Period Weeks   Status Achieved   OT SHORT TERM GOAL #3   Title Pt will decrease pain in LUE to 3/10 or less to increase ability to perform HEP   Time 4   Period Weeks   OT SHORT TERM GOAL #4   Title Pt will increase left elbow P/ROM to [facility] to increase ability use LUE as assist during dressing tasks.    Time 4   Period Weeks   Status On-going   OT SHORT TERM GOAL #5   Title Pt will increase left wrist P/ROM to [facility] to increase ability to use LUE as assist during grooming tasks.    Time 4   Period Weeks   Status Achieved   OT SHORT TERM GOAL #6   Title Pt will increase LUE strength to 3+/5 to increase ability to use LUE as assist when completing light housework tasks.    Time 4   Period Weeks   Status Partially Met           OT Long Term Goals - 10/23/15 1353    OT LONG TERM  GOAL #1   Title Pt will return to prior level of functioning and independence during daily and leisure tasks.    Time 8   Period Weeks   Status On-going   OT LONG TERM GOAL #2   Title Pt will decrease fascial restrictions in LUE from mod to min amounts or less to increase mobility during functional use of LUE.    Time 8   Period Weeks   Status Partially Met   OT LONG TERM GOAL #3   Title Pt will decrease pain to 1/10 or less to increase ability to actively use  LUE during daily tasks.    Time 8   Period Weeks   Status Achieved   OT LONG TERM GOAL #4   Title Pt will increase A/ROM of left elbow to [facility] to improve ability to wash and style hair independently.    Time 8   Period Weeks   Status On-going   OT LONG TERM GOAL #5   Title Pt will increase A/ROM of left wrist to [facility] to increase ability to functionally use LUE when preparing meals.    Time 8   Period Weeks   Status On-going   OT LONG TERM GOAL #6   Title Pt will increase LUE strength to 4/5 to increase ability to lift weighted objects such as pots and pans.   Time 8   Period Weeks   Status On-going               Plan - 10/30/15 1446    Clinical Impression Statement A: Pt arrived late to session today, reports minimal soreness after previous session. Pt with improved smoothness of motion in elbow flexion during passive stretching to elbow. Increased weight for elbow extension stretch to 4# in standing, continued with UBE. Pt reports continued stretches at home.    Rehab Potential Good   OT Frequency 2x / week   OT Duration 2 weeks   OT Treatment/Interventions Self-care/ADL training;Ultrasound;DME and/or AE instruction;Scar mobilization;Passive range of motion;Patient/family education;Cryotherapy;Electrical Stimulation;Splinting;Moist Heat;Therapeutic exercise;Manual Therapy;Therapeutic activities   Plan P: Resume 5# weighted stretch in supine, continue PVC pipe slide, focus on functional reaching activities to  promote elbow extension/flexion   Consulted and Agree with Plan of Care Patient      Patient will benefit from skilled therapeutic intervention in order to improve the following  deficits and impairments:  Decreased strength, Impaired sensation, Pain, Increased edema, Impaired UE functional use, Decreased range of motion, Increased fascial restricitons, Decreased skin integrity, Impaired flexibility, Decreased scar mobility  Visit Diagnosis: Stiffness of left elbow, not elsewhere classified  Other symptoms and signs involving the musculoskeletal system  Stiffness of left wrist, not elsewhere classified    Problem List Patient Active Problem List   Diagnosis Date Noted  . Humerus distal fracture 08/02/2015    Guadelupe Sabin, OTR/L  [phone]  10/30/2015, 2:51 PM  [place], [place], [address] Phone: [phone]   Fax:  [phone]  Name: Erica Mora MRN: [id number] Date of Birth: 1940/07/28

## 2015-11-01 ENCOUNTER — Ambulatory Visit (HOSPITAL_COMMUNITY): Payer: Medicare Other | Admitting: Occupational Therapy

## 2015-11-05 ENCOUNTER — Encounter (HOSPITAL_COMMUNITY): Payer: Self-pay | Admitting: Occupational Therapy

## 2015-11-05 ENCOUNTER — Ambulatory Visit (HOSPITAL_COMMUNITY): Payer: Medicare Other | Admitting: Occupational Therapy

## 2015-11-05 DIAGNOSIS — M25632 Stiffness of left wrist, not elsewhere classified: Secondary | ICD-10-CM

## 2015-11-05 DIAGNOSIS — R29898 Other symptoms and signs involving the musculoskeletal system: Secondary | ICD-10-CM

## 2015-11-05 DIAGNOSIS — M25622 Stiffness of left elbow, not elsewhere classified: Secondary | ICD-10-CM

## 2015-11-05 NOTE — Therapy (Signed)
[place], [place], [address] Phone: [phone]   Fax:  [phone]  Occupational Therapy Reassessment and Treatment  Patient Details  Name: Erica Mora MRN: [id number] Date of Birth: [birth year] Referring Provider: Altamese [place], MD  Encounter Date: 11/05/2015      OT End of Session - 11/05/15 1350    Visit Number 24   Number of Visits 24   Date for OT Re-Evaluation 11/08/15   Authorization Type Medicare/Medicare A& B primary; AARP secondary   Authorization Time Period Before 30th visit   Authorization - Visit Number 24   Authorization - Number of Visits 30   OT Start Time 1308  pt arrived late   OT Stop Time 1345   OT Time Calculation (min) 37 min   Activity Tolerance Patient tolerated treatment well   Behavior During Therapy [facility] for tasks assessed/performed      Past Medical History  Diagnosis Date  . Medical history non-contributory     Past Surgical History  Procedure Laterality Date  . Vaginal delivery    . Tubal ligation    . Eye surgery      cataracts- bilateral    . Orif humerus fracture Left 08/02/2015    Procedure: OPEN REDUCTION INTERNAL FIXATION (ORIF)LEFT DISTAL RADIUS,HUMERUS FRACTURE;  Surgeon: Altamese [place], MD;  Location: [place];  Service: Orthopedics;  Laterality: Left;    There were no vitals filed for this visit.      Subjective Assessment - 11/05/15 1350    Subjective  S: Today is going to be my last day until we see what the doctor says.    Currently in Pain? No/denies           [facility] OT Assessment - 11/05/15 1320    Assessment   Diagnosis s/p ORIF-left distal radius fx & left distal humerus fx   Precautions   Precautions Other (comment)   Precaution Comments Gentle P/ROM, AA/ROM, A/ROM progressing as tolerated, no resistance or strengthening for 8 weeks   AROM   Overall AROM Comments Assessed seated, ER/IR adducted   AROM Assessment Site Elbow;Forearm;Wrist    Right/Left Shoulder Left   Right/Left Elbow Left   Left Elbow Flexion 105  same as previous   Left Elbow Extension 50  45 previous   Right/Left Forearm Left   Left Forearm Pronation 82 Degrees  same as previous   Left Forearm Supination 89 Degrees  same as previous   Right/Left Wrist Left   Left Wrist Extension 45 Degrees  40 previous   Left Wrist Flexion 38 Degrees  32 previous   Left Wrist Radial Deviation 15 Degrees  same as previous   Left Wrist Ulnar Deviation 20 Degrees  same as previous   PROM   Overall PROM Comments Assessed seated   PROM Assessment Site Elbow;Forearm;Wrist   Right/Left Shoulder Left   Right/Left Elbow Left   Left Elbow Flexion 109  113 previous   Left Elbow Extension 38  same as previous   Right/Left Forearm Left   Left Forearm Pronation 90 Degrees  same as previous   Left Forearm Supination 90 Degrees  same as previous   Right/Left Wrist Left   Left Wrist Extension 52 Degrees  50 previous   Left Wrist Flexion 56 Degrees  56 previous   Left Wrist Radial Deviation 15 Degrees  same as previous   Left Wrist Ulnar Deviation 20 Degrees  same as previous   Strength   Strength Assessment  Site Elbow;Forearm;Wrist   Right/Left Elbow Left   Left Elbow Flexion 4-/5  same as previous   Left Elbow Extension 3+/5  same as previous   Right/Left Forearm Left   Left Forearm Pronation 3+/5  same as previous   Left Forearm Supination 3+/5  same as previous   Right/Left Wrist Left   Left Wrist Flexion 3+/5  same as previous   Left Wrist Extension 3+/5  same as previous   Left Wrist Radial Deviation 3+/5  same as previous   Left Wrist Ulnar Deviation 3+/5  same as previous                  OT Treatments/Exercises (OP) - 11/05/15 1327    Exercises   Exercises Elbow;Wrist;Shoulder;Hand   Elbow Exercises   Elbow Extension PROM;10 reps;Strengthening;15 reps   Bar Weights/Barbell (Elbow Extension) 3 lbs   Forearm Supination  PROM;Strengthening;10 reps  3#   Forearm Pronation PROM;Strengthening;10 reps  3#   Wrist Flexion PROM;Strengthening;10 reps   Bar Weights/Barbell (Wrist Flexion) 3 lbs   Wrist Extension PROM;Strengthening;10 reps   Bar Weights/Barbell (Wrist Extension) 3 lbs   Other elbow exercises P/ROM 10X, strengthening 15X 3# weight   Other elbow exercises elbow flexion stretch on mat table 2X45", elbow extension stretch using mat table 2X45", PVC slide focusing on elbow flexion/extension 10X   Weighted Stretch Over Towel Roll   Wrist Flexion - Weighted Stretch 3 pounds;60 seconds   Wrist Extension - Weighted Stretch 3 pounds;60 seconds   Manual Therapy   Manual Therapy Myofascial release   Manual therapy comments Manual therapy was completed prior to exercises.   Myofascial Release Myofascial release and manual stretching to left hand/digits, wrist, elbow region to decrease fascial restrictions and increase joint mobility in a pain free zone.                OT Education - 11/05/15 1624    Education provided Yes   Education Details reviewed HEP exercises and stretches, instructed pt to increase weight to 2-3 pounds for strengthening exercises   Person(s) Educated Patient   Methods Explanation   Comprehension Verbalized understanding          OT Short Term Goals - 10/23/15 1353    OT SHORT TERM GOAL #1   Title Pt will be provided with and educated on HEP.    Time 4   Period Weeks   Status Achieved   OT SHORT TERM GOAL #2   Title Pt will decrease fascial restrictions from max to mod amounts to increase joint mobility of the LUE at the elbow and wrist.    Time 4   Period Weeks   Status Achieved   OT SHORT TERM GOAL #3   Title Pt will decrease pain in LUE to 3/10 or less to increase ability to perform HEP   Time 4   Period Weeks   OT SHORT TERM GOAL #4   Title Pt will increase left elbow P/ROM to [facility] to increase ability use LUE as assist during dressing tasks.    Time 4    Period Weeks   Status On-going   OT SHORT TERM GOAL #5   Title Pt will increase left wrist P/ROM to [facility] to increase ability to use LUE as assist during grooming tasks.    Time 4   Period Weeks   Status Achieved   OT SHORT TERM GOAL #6   Title Pt will increase LUE strength to 3+/5 to increase ability  to use LUE as assist when completing light housework tasks.    Time 4   Period Weeks   Status Partially Met           OT Long Term Goals - 10/23/15 1353    OT LONG TERM GOAL #1   Title Pt will return to prior level of functioning and independence during daily and leisure tasks.    Time 8   Period Weeks   Status On-going   OT LONG TERM GOAL #2   Title Pt will decrease fascial restrictions in LUE from mod to min amounts or less to increase mobility during functional use of LUE.    Time 8   Period Weeks   Status Partially Met   OT LONG TERM GOAL #3   Title Pt will decrease pain to 1/10 or less to increase ability to actively use  LUE during daily tasks.    Time 8   Period Weeks   Status Achieved   OT LONG TERM GOAL #4   Title Pt will increase A/ROM of left elbow to [facility] to improve ability to wash and style hair independently.    Time 8   Period Weeks   Status On-going   OT LONG TERM GOAL #5   Title Pt will increase A/ROM of left wrist to [facility] to increase ability to functionally use LUE when preparing meals.    Time 8   Period Weeks   Status On-going   OT LONG TERM GOAL #6   Title Pt will increase LUE strength to 4/5 to increase ability to lift weighted objects such as pots and pans.   Time 8   Period Weeks   Status On-going               Plan - 11/05/15 1625    Clinical Impression Statement A: Pt arrived late to session, informed OT this will be her last day until after she returns to MD and decides if she will have the elbow scraped. Measurements taken today, pt has improved wrist A/ROM measurements slightly. Pt has met 4/5 STGs, 1/6 LTGs and has partially met an  additional LTG. Pt continues to be limited in A/ROM and strength of LUE impacting her ability to use functionally during daily and leisure tasks. OT will be held until after MD appt on June 5th, pt instructed to call and inform rehab staff of decision to have additional surgery and resume therapy or discharge.    Rehab Potential Good   OT Frequency 2x / week   OT Duration 2 weeks   OT Treatment/Interventions Self-care/ADL training;Ultrasound;DME and/or AE instruction;Scar mobilization;Passive range of motion;Patient/family education;Cryotherapy;Electrical Stimulation;Splinting;Moist Heat;Therapeutic exercise;Manual Therapy;Therapeutic activities   Plan P: Hold therapy until MD appt on 11/18/15.    Consulted and Agree with Plan of Care Patient      Patient will benefit from skilled therapeutic intervention in order to improve the following deficits and impairments:  Decreased strength, Impaired sensation, Pain, Increased edema, Impaired UE functional use, Decreased range of motion, Increased fascial restricitons, Decreased skin integrity, Impaired flexibility, Decreased scar mobility  Visit Diagnosis: Stiffness of left elbow, not elsewhere classified  Other symptoms and signs involving the musculoskeletal system  Stiffness of left wrist, not elsewhere classified    Problem List Patient Active Problem List   Diagnosis Date Noted  . Humerus distal fracture 08/02/2015    Guadelupe Sabin, OTR/L  [phone]  11/05/2015, 4:30 PM  [place]  [place], [place], [address] Phone: [phone]   Fax:  [phone]  Name: Erica Mora MRN: [id number] Date of Birth: 06-30-1940

## 2015-11-07 ENCOUNTER — Encounter (HOSPITAL_COMMUNITY): Payer: Medicare Other

## 2015-11-27 ENCOUNTER — Encounter (HOSPITAL_COMMUNITY): Payer: Self-pay | Admitting: Occupational Therapy

## 2015-11-27 NOTE — Therapy (Signed)
[place], [place], [address] Phone: [phone]   Fax:  [phone]  November 27, 2015     Occupational Therapy Discharge Summary   Patient: Erica Mora MRN: [id number] Date of Birth: [birth year]  Diagnosis: No diagnosis found.  Referring Provider: Altamese [place], MD  Visits from [facility] of Care: 24  OT Short Term Goals - 10/23/15 1353    OT SHORT TERM GOAL #1   Title Pt will be provided with and educated on HEP.    Time 4   Period Weeks   Status Achieved   OT SHORT TERM GOAL #2   Title Pt will decrease fascial restrictions from max to mod amounts to increase joint mobility of the LUE at the elbow and wrist.    Time 4   Period Weeks   Status Achieved   OT SHORT TERM GOAL #3   Title Pt will decrease pain in LUE to 3/10 or less to increase ability to perform HEP   Time 4   Period Weeks   OT SHORT TERM GOAL #4   Title Pt will increase left elbow P/ROM to [facility] to increase ability use LUE as assist during dressing tasks.    Time 4   Period Weeks   Status Not Met   OT SHORT TERM GOAL #5   Title Pt will increase left wrist P/ROM to [facility] to increase ability to use LUE as assist during grooming tasks.    Time 4   Period Weeks   Status Achieved   OT SHORT TERM GOAL #6   Title Pt will increase LUE strength to 3+/5 to increase ability to use LUE as assist when completing light housework tasks.    Time 4   Period Weeks   Status Partially Met           OT Long Term Goals - 10/23/15 1353    OT LONG TERM GOAL #1   Title Pt will return to prior level of functioning and independence during daily and leisure tasks.    Time 8   Period Weeks   Status Not Met   OT LONG TERM GOAL #2   Title Pt will decrease fascial restrictions in LUE from mod to min amounts or less to increase mobility during functional use of  LUE.    Time 8   Period Weeks   Status Partially Met   OT LONG TERM GOAL #3   Title Pt will decrease pain to 1/10 or less to increase ability to actively use LUE during daily tasks.    Time 8   Period Weeks   Status Achieved   OT LONG TERM GOAL #4   Title Pt will increase A/ROM of left elbow to [facility] to improve ability to wash and style hair independently.    Time 8   Period Weeks   Status Not Met   OT LONG TERM GOAL #5   Title Pt will increase A/ROM of left wrist to [facility] to increase ability to functionally use LUE when preparing meals.    Time 8   Period Weeks   Status Partially Met   OT LONG TERM GOAL #6   Title Pt will increase LUE strength to 4/5 to increase ability to lift weighted objects such as pots and pans.   Time 8   Period Weeks   Status Not Met         Current functional level related to goals / functional outcomes: See  goals above.  Pt has met 4/6 STGs, 1/6 LTGs and has partially met 1/6 STGs and 2/6 LTGs. Pt has made improvements in A/ROM and strength since beginning occupational therapy, however has progress has been at a plateau since last reassessment. Pt requested to hold  therapy until after MD visit; pt has not called to inform of further management decisions, is being discharged and will need new order if needs to resume therapy in the future.    Remaining deficits: Pt continues to have deficits in range of motion and strength in LUE, limiting her ability to complete ADL and leisure activities. End range of elbow flexion is hard end feel.     Education / Equipment: Pt provided with updated HEP including stretching, range of motion, and strengthening exercises for left elbow, forearm, and wrist.   Plan: Patient agrees to discharge.  Patient goals were partially met. Patient is being discharged due to lack of progress.  ?????         Sincerely, Guadelupe Sabin, OTR/L   [phone] 11/27/2015   [place] [address] [place], [place], [address] Phone: [phone]   Fax:  [phone]  Patient: Erica Mora MRN: [id number] Date of Birth: 12/10/1940

## 2018-07-07 ENCOUNTER — Ambulatory Visit (INDEPENDENT_AMBULATORY_CARE_PROVIDER_SITE_OTHER): Payer: Medicare Other | Admitting: Otolaryngology

## 2018-07-07 DIAGNOSIS — H6122 Impacted cerumen, left ear: Secondary | ICD-10-CM | POA: Diagnosis not present

## 2018-07-07 DIAGNOSIS — H903 Sensorineural hearing loss, bilateral: Secondary | ICD-10-CM | POA: Diagnosis not present

## 2020-05-14 ENCOUNTER — Ambulatory Visit
Admission: EM | Admit: 2020-05-14 | Discharge: 2020-05-14 | Disposition: A | Discharge status: Home / Self Care | Payer: Medicare Other | Attending: Emergency Medicine | Admitting: Emergency Medicine

## 2020-05-14 ENCOUNTER — Other Ambulatory Visit: Payer: Self-pay

## 2020-05-14 ENCOUNTER — Encounter: Payer: Self-pay | Admitting: Emergency Medicine

## 2020-05-14 DIAGNOSIS — M7989 Other specified soft tissue disorders: Secondary | ICD-10-CM | POA: Diagnosis not present

## 2020-05-14 NOTE — Discharge Instructions (Addendum)
  Right Leg ultrasound was ordered at [facility] outpatient vascular Please have it completed tomorrow Follow-up and establish care with PCP Return or go to ED if you develop any new or worsening of your symptoms

## 2020-05-14 NOTE — ED Provider Notes (Addendum)
[facility]   [id number] 05/14/20 Arrival Time: 4431   Chief Complaint  Patient presents with  . Leg Swelling     SUBJECTIVE: History from: patient and family.  Erica Mora is a 79 y.o. female who presented to the urgent care with a complaint of right leg swelling since October.  Denies any precipitating event, trauma or injury.  She localized discomfort and tenderness to her calf.    He has tried OTC medications and exercise without relief.  Her symptoms are made worse with ROM.  She denies similar symptoms in the past.  Denies chills, fever, nausea, vomiting, diarrhea or any history of blood clots  ROS: As per HPI.  All other pertinent ROS negative.      Past Medical History:  Diagnosis Date  . Medical history non-contributory    Past Surgical History:  Procedure Laterality Date  . EYE SURGERY     cataracts- bilateral    . ORIF HUMERUS FRACTURE Left 08/02/2015   Procedure: OPEN REDUCTION INTERNAL FIXATION (ORIF)LEFT DISTAL RADIUS,HUMERUS FRACTURE;  Surgeon: Altamese Otter [place], MD;  Location: [facility];  Service: Orthopedics;  Laterality: Left;  . TUBAL LIGATION    . VAGINAL DELIVERY     No Known Allergies No current facility-administered medications on file prior to encounter.   Current Outpatient Medications on File Prior to Encounter  Medication Sig Dispense Refill  . anti-nausea (EMETROL) solution Take 10 mLs by mouth every 15 (fifteen) minutes as needed for nausea or vomiting. Reported on 08/30/2015    . calcium carbonate (TUMS EX) 750 MG chewable tablet Chew 1 tablet by mouth daily as needed for heartburn.    Marland Kitchen HYDROcodone-acetaminophen (NORCO/VICODIN) 5-325 MG tablet Take 1-2 tablets by mouth every 6 (six) hours as needed for moderate pain. 90 tablet 0  . Multiple Vitamins-Minerals (MULTIVITAMIN WITH MINERALS) tablet Take 1 tablet by mouth daily. Reported on 08/30/2015    . oxyCODONE (ROXICODONE) 5 MG immediate release tablet Take 1-2 tablets (5-10 mg total) by  mouth every 4 (four) hours as needed for severe pain or breakthrough pain. 40 tablet 0   Social History   Socioeconomic History  . Marital status: Married    Spouse name: Not on file  . Number of children: Not on file  . Years of education: Not on file  . Highest education level: Not on file  Occupational History  . Not on file  Tobacco Use  . Smoking status: Never Smoker  . Smokeless tobacco: Never Used  Substance and Sexual Activity  . Alcohol use: Yes    Comment: wine glasses per day - 2 per day  . Drug use: No  . Sexual activity: Not on file  Other Topics Concern  . Not on file  Social History Narrative  . Not on file   Social Determinants of Health   Financial Resource Strain:   . Difficulty of Paying Living Expenses: Not on file  Food Insecurity:   . Worried About Charity fundraiser in the Last Year: Not on file  . Ran Out of Food in the Last Year: Not on file  Transportation Needs:   . Lack of Transportation (Medical): Not on file  . Lack of Transportation (Non-Medical): Not on file  Physical Activity:   . Days of Exercise per Week: Not on file  . Minutes of Exercise per Session: Not on file  Stress:   . Feeling of Stress : Not on file  Social Connections:   . Frequency of Communication  with Friends and Family: Not on file  . Frequency of Social Gatherings with Friends and Family: Not on file  . Attends Religious Services: Not on file  . Active Member of Clubs or Organizations: Not on file  . Attends Archivist Meetings: Not on file  . Marital Status: Not on file  Intimate Partner Violence:   . Fear of Current or Ex-Partner: Not on file  . Emotionally Abused: Not on file  . Physically Abused: Not on file  . Sexually Abused: Not on file   No family history on file.  OBJECTIVE:  Vitals:   05/14/20 1332  BP: (!) 161/99  Pulse: (!) 106  Resp: 19  Temp: 98.3 F (36.8 C)  TempSrc: Oral  SpO2: 95%  Weight: 110 lb (49.9 kg)  Height: 5'  (1.524 m)     Physical Exam Vitals and nursing note reviewed.  Constitutional:      General: She is not in acute distress.    Appearance: Normal appearance. She is normal weight. She is not ill-appearing, toxic-appearing or diaphoretic.  HENT:     Head: Normocephalic.  Cardiovascular:     Rate and Rhythm: Normal rate and regular rhythm.     Pulses: Normal pulses.     Heart sounds: Normal heart sounds. No murmur heard.  No friction rub. No gallop.   Pulmonary:     Effort: Pulmonary effort is normal. No respiratory distress.     Breath sounds: Normal breath sounds. No stridor. No wheezing, rhonchi or rales.  Chest:     Chest wall: No tenderness.  Musculoskeletal:        General: Swelling present.     Right upper leg: Swelling and edema present.     Left upper leg: No swelling or edema.     Right lower leg: Swelling present. 2+ Edema present.     Left lower leg: No swelling. No edema.     Comments: The right leg is with obvious deformity when compared to the left leg. Swelling and edema present. Tenderness and pain on palpation of calf muscle. Normal range of motion.Neurovascular status intact.  Neurological:     Mental Status: She is alert and oriented to person, place, and time.     LABS:  No results found for this or any previous visit (from the past 24 hour(s)).   ASSESSMENT & PLAN:  1. Right leg swelling     No orders of the defined types were placed in this encounter.  Patient stable at discharge. She was advised to go to ER but patient declined. Outpatient ultrasound was ordered. She was advised to establish care and follow-up with PCP.    Discharge instructions  Right Leg ultrasound was ordered at [facility] outpatient vascular Please have it completed tomorrow Follow-up and establish care with PCP Return or go to ED if you develop any new or worsening of your symptoms  Reviewed expectations re: course of current medical issues. Questions answered. Outlined  signs and symptoms indicating need for more acute intervention. Patient verbalized understanding. After Visit Summary given.         Emerson Monte, FNP 05/14/20 1415    Emerson Monte, FNP 05/14/20 1427    Emerson Monte, FNP 05/14/20 1427

## 2020-05-14 NOTE — ED Triage Notes (Signed)
Swelling to RT leg from above knee all the way down to foot.  Denies any injury.

## 2020-05-15 ENCOUNTER — Ambulatory Visit (HOSPITAL_COMMUNITY)
Admission: RE | Admit: 2020-05-15 | Discharge: 2020-05-15 | Disposition: A | Discharge status: Home / Self Care | Payer: Medicare Other | Source: Ambulatory Visit | Attending: Emergency Medicine | Admitting: Emergency Medicine

## 2020-05-15 DIAGNOSIS — R609 Edema, unspecified: Secondary | ICD-10-CM | POA: Diagnosis not present

## 2020-05-15 DIAGNOSIS — M7989 Other specified soft tissue disorders: Secondary | ICD-10-CM | POA: Diagnosis present

## 2020-05-15 NOTE — Progress Notes (Signed)
Lower extremity venous has been completed.   Preliminary results in CV Proc.   Abram Sander 05/15/2020 11:04 AM

## 2020-07-23 ENCOUNTER — Other Ambulatory Visit: Payer: Self-pay | Admitting: Internal Medicine

## 2020-07-23 DIAGNOSIS — R109 Unspecified abdominal pain: Secondary | ICD-10-CM

## 2020-07-23 DIAGNOSIS — R609 Edema, unspecified: Secondary | ICD-10-CM

## 2020-07-31 ENCOUNTER — Other Ambulatory Visit: Payer: Self-pay

## 2020-07-31 ENCOUNTER — Ambulatory Visit (HOSPITAL_COMMUNITY)
Admission: RE | Admit: 2020-07-31 | Discharge: 2020-07-31 | Disposition: A | Discharge status: Home / Self Care | Payer: Medicare Other | Source: Ambulatory Visit | Attending: Internal Medicine | Admitting: Internal Medicine

## 2020-07-31 DIAGNOSIS — R109 Unspecified abdominal pain: Secondary | ICD-10-CM | POA: Insufficient documentation

## 2020-08-15 ENCOUNTER — Encounter (HOSPITAL_COMMUNITY): Payer: Self-pay

## 2020-08-15 ENCOUNTER — Encounter (HOSPITAL_COMMUNITY): Payer: Self-pay | Admitting: Hematology

## 2020-08-15 ENCOUNTER — Inpatient Hospital Stay (HOSPITAL_COMMUNITY): Payer: Medicare Other | Attending: Hematology | Admitting: Hematology

## 2020-08-15 ENCOUNTER — Inpatient Hospital Stay (HOSPITAL_COMMUNITY): Payer: Medicare Other

## 2020-08-15 ENCOUNTER — Other Ambulatory Visit: Payer: Self-pay

## 2020-08-15 VITALS — BP 147/75 | HR 87 | Temp 96.8°F | Resp 16 | Ht 60.0 in | Wt 113.8 lb

## 2020-08-15 DIAGNOSIS — L299 Pruritus, unspecified: Secondary | ICD-10-CM | POA: Diagnosis not present

## 2020-08-15 DIAGNOSIS — N1330 Unspecified hydronephrosis: Secondary | ICD-10-CM | POA: Diagnosis not present

## 2020-08-15 DIAGNOSIS — R590 Localized enlarged lymph nodes: Secondary | ICD-10-CM | POA: Diagnosis not present

## 2020-08-15 DIAGNOSIS — Z5112 Encounter for antineoplastic immunotherapy: Secondary | ICD-10-CM | POA: Diagnosis not present

## 2020-08-15 DIAGNOSIS — Z809 Family history of malignant neoplasm, unspecified: Secondary | ICD-10-CM | POA: Insufficient documentation

## 2020-08-15 DIAGNOSIS — Z79899 Other long term (current) drug therapy: Secondary | ICD-10-CM | POA: Diagnosis not present

## 2020-08-15 DIAGNOSIS — Z5111 Encounter for antineoplastic chemotherapy: Secondary | ICD-10-CM | POA: Diagnosis present

## 2020-08-15 DIAGNOSIS — C8335 Diffuse large B-cell lymphoma, lymph nodes of inguinal region and lower limb: Secondary | ICD-10-CM | POA: Diagnosis present

## 2020-08-15 DIAGNOSIS — Z20822 Contact with and (suspected) exposure to covid-19: Secondary | ICD-10-CM | POA: Diagnosis not present

## 2020-08-15 DIAGNOSIS — Z5189 Encounter for other specified aftercare: Secondary | ICD-10-CM | POA: Insufficient documentation

## 2020-08-15 DIAGNOSIS — R591 Generalized enlarged lymph nodes: Secondary | ICD-10-CM | POA: Diagnosis not present

## 2020-08-15 DIAGNOSIS — Z8041 Family history of malignant neoplasm of ovary: Secondary | ICD-10-CM | POA: Diagnosis not present

## 2020-08-15 LAB — COMPREHENSIVE METABOLIC PANEL
ALT: 9 U/L (ref 0–44)
AST: 16 U/L (ref 15–41)
Albumin: 3 g/dL — ABNORMAL LOW (ref 3.5–5.0)
Alkaline Phosphatase: 122 U/L (ref 38–126)
Anion gap: 10 (ref 5–15)
BUN: 8 mg/dL (ref 8–23)
CO2: 29 mmol/L (ref 22–32)
Calcium: 9 mg/dL (ref 8.9–10.3)
Chloride: 95 mmol/L — ABNORMAL LOW (ref 98–111)
Creatinine, Ser: 0.83 mg/dL (ref 0.44–1.00)
GFR, Estimated: >60 mL/min (ref >60)
Glucose, Bld: 99 mg/dL (ref 70–99)
Potassium: 4.3 mmol/L (ref 3.5–5.1)
Sodium: 134 mmol/L — ABNORMAL LOW (ref 135–145)
Total Bilirubin: 0.8 mg/dL (ref 0.3–1.2)
Total Protein: 7 g/dL (ref 6.5–8.1)

## 2020-08-15 LAB — CBC WITH DIFFERENTIAL/PLATELET
Abs Immature Granulocytes: 0.03 10*3/uL (ref 0.00–0.07)
Basophils Absolute: 0.1 10*3/uL (ref 0.0–0.1)
Basophils Relative: 1 %
Eosinophils Absolute: 0.5 10*3/uL (ref 0.0–0.5)
Eosinophils Relative: 6 %
HCT: 33.3 % — ABNORMAL LOW (ref 36.0–46.0)
Hemoglobin: 10.5 g/dL — ABNORMAL LOW (ref 12.0–15.0)
Immature Granulocytes: 0 %
Lymphocytes Relative: 9 %
Lymphs Abs: 0.7 10*3/uL (ref 0.7–4.0)
MCH: 27.8 pg (ref 26.0–34.0)
MCHC: 31.5 g/dL (ref 30.0–36.0)
MCV: 88.1 fL (ref 80.0–100.0)
Monocytes Absolute: 1 10*3/uL (ref 0.1–1.0)
Monocytes Relative: 12 %
Neutro Abs: 5.5 10*3/uL (ref 1.7–7.7)
Neutrophils Relative %: 72 %
Platelets: 222 10*3/uL (ref 150–400)
RBC: 3.78 MIL/uL — ABNORMAL LOW (ref 3.87–5.11)
RDW: 14.7 % (ref 11.5–15.5)
WBC: 7.8 10*3/uL (ref 4.0–10.5)
nRBC: 0 % (ref 0.0–0.2)

## 2020-08-15 LAB — URIC ACID: Uric Acid, Serum: 7.6 mg/dL — ABNORMAL HIGH (ref 2.5–7.1)

## 2020-08-15 LAB — LACTATE DEHYDROGENASE: LDH: 184 U/L (ref 98–192)

## 2020-08-15 NOTE — Patient Instructions (Signed)
[place] at [facility] Discharge Instructions  You were seen and examined today by Dr. Delton Coombes. Dr. Delton Coombes discussed your past medical history, family history of cancer and the events that led to you being here today.  You were seen today by Dr. Delton Coombes related to your swollen lymph nodes. Dr. Delton Coombes noted these under your arms, your back, and on each side of your groin today. Dr. Delton Coombes has recommended additional lab work today as well as a PET scan. A PET scan is a specialized CT scan that illuminates where cancer may be present in your body. Dr. Delton Coombes is trying to identify the cause of this lymph node swelling, Dr. Delton Coombes is attempting to determine if this is related to Lymphoma. For diagnostic purposes a biopsy is required, a general surgeon is able to do this. We will arrange a referral for the general surgeon here in [place].  Follow-up as scheduled following your PET scan and biopsy.   Thank you for choosing [place] at [facility] to provide your oncology and hematology care.  To afford each patient quality time with our provider, please arrive at least 15 minutes before your scheduled appointment time.   If you have a lab appointment with the [facility] please come in thru the Main Entrance and check in at the main information desk.  You need to re-schedule your appointment should you arrive 10 or more minutes late.  We strive to give you quality time with our providers, and arriving late affects you and other patients whose appointments are after yours.  Also, if you no show three or more times for appointments you may be dismissed from the clinic at the providers discretion.     Again, thank you for choosing [facility].  Our hope is that these requests will decrease the amount of time that you wait before being seen by our physicians.        _____________________________________________________________  Should you have questions after your visit to [facility], please contact our office at [phone] and follow the prompts.  Our office hours are 8:00 a.m. and 4:30 p.m. Monday - Friday.  Please note that voicemails left after 4:00 p.m. may not be returned until the following business day.  We are closed weekends and major holidays.  You do have access to a nurse 24-7, just call the main number to the clinic [phone] and do not press any options, hold on the line and a nurse will answer the phone.    For prescription refill requests, have your pharmacy contact our office and allow 72 hours.    Due to Covid, you will need to wear a mask upon entering the hospital. If you do not have a mask, a mask will be given to you at the Main Entrance upon arrival. For doctor visits, patients may have 1 support person age 58 or older with them. For treatment visits, patients can not have anyone with them due to social distancing guidelines and our immunocompromised population.

## 2020-08-15 NOTE — Progress Notes (Signed)
I met with the patient and her husband today during and after her initial visit with Dr. Delton Coombes. I introduced myself and explained my role in the patient's care. I provided the patient with the opportunity to ask questions and express concerns, all were answered to the patient's satisfaction. I provided emotional support to the patient. I provided my contact information to the patient and encouraged her to call with any questions or concerns that may arise.

## 2020-08-15 NOTE — Progress Notes (Signed)
[name], [place] [address]   CLINIC:  Medical Oncology/Hematology  Patient Care Team: Asencion Noble, MD as PCP - General (Internal Medicine)  CHIEF COMPLAINTS/PURPOSE OF CONSULTATION:  Evaluation of diffuse lymphadenopathy  HISTORY OF PRESENTING ILLNESS:  Erica Mora 80 y.o. female is here because of evaluation of diffuse lymphadenopathy, at the request of Dr. Asencion Noble. A CT scan on 07/31/2020 showed lymphadenopathy in the retroperitoneal, pelvic and inguinal areas.  Today she is accompanied by her husband, Erica Mora, and she reports feeling well. She reports that she developed swelling in her right leg around 03/2020 which then spread to the left leg in 04/2020. She was put on 2 different diuretics which helped bring down the swelling a little. She also complains of having itching from her scalp down to her legs since 04/2020; she denies worsening of her itching after drinking alcohol. She gets bloated when she drinks water. Her legs feel heavy and do not bend due to the swelling which interferes with her gait. 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 She denies having any recent F/C, night sweats, weight loss. Her appetite is excellent and she denies having N/V, though she has symptoms of GERD.  She used to work as a Art therapist. She denies having any chemical exposure. She is a non-smoker though she drinks wine. Her sister had ovarian cancer and died at 57; her mother had some kind of cancer, possibly ovarian cancer; several more maternal relatives possibly had cancer. She has not seen a gynecologist for several years, though she has been getting mammograms.  She is scheduled to see urology on 03/23.   MEDICAL HISTORY:  Past Medical History:  Diagnosis Date  . Prolapse of female bladder, acquired 1973    SURGICAL HISTORY: Past Surgical History:  Procedure Laterality Date  . EYE SURGERY     cataracts- bilateral    .  ORIF HUMERUS FRACTURE Left 08/02/2015   Procedure: OPEN REDUCTION INTERNAL FIXATION (ORIF)LEFT DISTAL RADIUS,HUMERUS FRACTURE;  Surgeon: Altamese Itta Bena, MD;  Location: [facility];  Service: Orthopedics;  Laterality: Left;  . TUBAL LIGATION    . VAGINAL DELIVERY      SOCIAL HISTORY: Social History   Socioeconomic History  . Marital status: Married    Spouse name: Not on file  . Number of children: Not on file  . Years of education: Not on file  . Highest education level: Not on file  Occupational History  . Not on file  Tobacco Use  . Smoking status: Never Smoker  . Smokeless tobacco: Never Used  Substance and Sexual Activity  . Alcohol use: Yes    Comment: wine glasses per day - 2 per day  . Drug use: No  . Sexual activity: Not on file  Other Topics Concern  . Not on file  Social History Narrative  . Not on file   Social Determinants of Health   Financial Resource Strain: Not on file  Food Insecurity: Not on file  Transportation Needs: Not on file  Physical Activity: Not on file  Stress: Not on file  Social Connections: Not on file  Intimate Partner Violence: Not At Risk  . Fear of Current or Ex-Partner: No  . Emotionally Abused: No  . Physically Abused: No  . Sexually Abused: No    FAMILY HISTORY: No family history on file.  ALLERGIES:  has No Known Allergies.  MEDICATIONS:  Current Outpatient Medications  Medication Sig Dispense  Refill  . anti-nausea (EMETROL) solution Take 10 mLs by mouth every 15 (fifteen) minutes as needed for nausea or vomiting. Reported on 08/30/2015    . calcium carbonate (TUMS EX) 750 MG chewable tablet Chew 1 tablet by mouth daily as needed for heartburn.    . furosemide (LASIX) 20 MG tablet Take 20 mg by mouth every morning.    . hydrochlorothiazide (HYDRODIURIL) 25 MG tablet Take 25 mg by mouth every morning.    Marland Kitchen HYDROcodone-acetaminophen (NORCO/VICODIN) 5-325 MG tablet Take 1-2 tablets by mouth every 6 (six) hours as needed for moderate  pain. 90 tablet 0  . hydrOXYzine (ATARAX/VISTARIL) 10 MG tablet Take 10 mg by mouth 3 (three) times daily as needed.    . Multiple Vitamins-Minerals (MULTIVITAMIN WITH MINERALS) tablet Take 1 tablet by mouth daily. Reported on 08/30/2015    . oxyCODONE (ROXICODONE) 5 MG immediate release tablet Take 1-2 tablets (5-10 mg total) by mouth every 4 (four) hours as needed for severe pain or breakthrough pain. 40 tablet 0   No current facility-administered medications for this visit.    REVIEW OF SYSTEMS:   Review of Systems  Constitutional: Positive for fatigue (90%). Negative for appetite change, chills, diaphoresis, fever and unexpected weight change.  Cardiovascular: Positive for leg swelling (bilat).  Gastrointestinal: Positive for constipation and diarrhea. Negative for nausea and vomiting.  Musculoskeletal: Positive for gait problem (due to leg swelling).  Skin: Positive for itching (whole body).  Neurological: Positive for gait problem (due to leg swelling).  Hematological: Positive for adenopathy (inguinal region bilat).  All other systems reviewed and are negative.    PHYSICAL EXAMINATION: ECOG PERFORMANCE STATUS: 1 - Symptomatic but completely ambulatory  Vitals:   08/15/20 0813  BP: (!) 147/75  Pulse: 87  Resp: 16  Temp: (!) 96.8 F (36 C)  SpO2: 100%   Filed Weights   08/15/20 0813  Weight: 113 lb 12.8 oz (51.6 kg)   Physical Exam Vitals reviewed.  Constitutional:      Appearance: Normal appearance.  Cardiovascular:     Rate and Rhythm: Normal rate and regular rhythm.     Pulses: Normal pulses.     Heart sounds: Normal heart sounds.  Pulmonary:     Effort: Pulmonary effort is normal.     Breath sounds: Normal breath sounds.  Chest:  Breasts:     Right: Axillary adenopathy (2 cm LN's) present. No supraclavicular adenopathy.     Left: Axillary adenopathy (sub-cm LN's) present. No supraclavicular adenopathy.    Abdominal:     Palpations: Abdomen is soft. There  is no hepatomegaly, splenomegaly or mass.     Tenderness: There is no abdominal tenderness.     Hernia: No hernia is present.  Musculoskeletal:     Right lower leg: Edema (lymphedema) present.     Left lower leg: Edema (lymphedema) present.  Lymphadenopathy:     Cervical: No cervical adenopathy.     Upper Body:     Right upper body: Axillary adenopathy (2 cm LN's) present. No supraclavicular adenopathy.     Left upper body: Axillary adenopathy (sub-cm LN's) present. No supraclavicular adenopathy.     Lower Body: Right inguinal adenopathy (2 cm LN's) present. Left inguinal adenopathy (2 cm LN's) present.  Neurological:     General: No focal deficit present.     Mental Status: She is alert and oriented to person, place, and time.  Psychiatric:        Mood and Affect: Mood normal. Affect is tearful.  Behavior: Behavior normal.      LABORATORY DATA:  I have reviewed the data as listed No results found for this or any previous visit (from the past 2160 hour(s)).  RADIOGRAPHIC STUDIES: I have personally reviewed the radiological images as listed and agreed with the findings in the report. CT ABDOMEN PELVIS WO CONTRAST  Result Date: 08/02/2020 CLINICAL DATA:  Bladder issues EXAM: CT ABDOMEN AND PELVIS WITHOUT CONTRAST TECHNIQUE: Multidetector CT imaging of the abdomen and pelvis was performed following the standard protocol without IV contrast. COMPARISON:  None. FINDINGS: Lower chest: RIGHT middle lobe atelectasis. Hepatobiliary: Incompletely assessed hypodense lesion of the inferior LEFT liver measures 9 mm and incompletely assessed and too small to accurately characterize (series 5, image 19; series 2, image 17). Gallbladder is unremarkable. Pancreas: No peripancreatic fat stranding. Spleen: Unremarkable. Adrenals/Urinary Tract: Adrenal glands are unremarkable. There is moderate LEFT hydroureteronephrosis with prominent dilation of the LEFT renal pelvis. Ureter is dilated throughout  its course to the LEFT UVJ. No obstructive ureterolithiasis. The bladder is prolapsed into the vulva with LEFT UVJ below the level of the perineum (series 2, image 80). No RIGHT-sided hydronephrosis. The urethra is not well visualized. Bladder is mildly thick-walled. Stomach/Bowel: No evidence of bowel obstruction. Moderate to large colonic stool burden throughout the colon. Appendix is unremarkable. Vascular/Lymphatic: Moderate atherosclerotic calcifications. There is retroperitoneal, pelvic and inguinal lymphadenopathy. Aortocaval lymph node measures 12 mm in the short axis (series 2, image 27). Representative RIGHT pelvic sidewall lymph node measures 14 mm in the short axis (series 2, image 62). RIGHT inguinal lymph node measures 21 mm in the short axis (series 2, image 84). LEFT pelvic sidewall lymph node measures 15 mm in the short axis (series 2, image 61). Reproductive: Uterus is present. Other: Fat stranding along the pannus. Small volume fluid tracking along the pannus. No intra-abdominal free air or free fluid. Musculoskeletal: Mild compression fracture deformity of L1. This is age indeterminate. There is mild retropulsion of the superior endplate by 3-4 mm. IMPRESSION: 1. Moderate LEFT hydroureteronephrosis with prominent dilation of the LEFT renal pelvis. No obstructive ureterolithiasis. The bladder is prolapsed into the vulva with LEFT UVJ below the level of the perineum. Obstruction may be due to prolapse physiology. The urethra is not well visualized. 2. Retroperitoneal, pelvic, and inguinal lymphadenopathy. This is nonspecific but given diffuse nature, this is concerning for an underlying lymphoproliferative disorder. Consider further evaluation with dedicated tissue sampling. 3. Mild compression fracture deformity of L1 is age indeterminate. Recommend correlation with point tenderness. 4. Moderate to large colonic stool burden. These results will be called to the ordering clinician or representative  by the Radiologist Assistant, and communication documented in the PACS or [employer]. Electronically Signed   By: Valentino Saxon MD   On: 08/02/2020 14:51    ASSESSMENT:  1.  Diffuse lymphadenopathy in retroperitoneal, pelvic and inguinal regions: -Presentation with leg swelling starting end of October 2021 -Evaluated at urgent care on 05/15/2020 with leg Doppler-no DVT but showed enlarged lymph nodes in the right groin. -Was evaluated by Dr. Willey Blade and a CT scan AP was ordered. -CTAP on 07/31/2020 showed retroperitoneal, pelvic and inguinal adenopathy with mild compression fracture deformity of L1 age-indeterminate.  Moderate left hydroureteronephrosis with prominent dilatation of the left renal pelvis. -Denies any fevers, night sweats or weight loss in the past 6 months. -Reports generalized itching for the past 4 months.  2.  Social/family history: -She worked as a Art therapist prior to retirement.  She was  a never smoker. -She drinks wine occasionally. -Sister died of ovarian cancer at age 83.  Mother died of possible pancreatic cancer.   PLAN:  1.  Diffuse lymphadenopathy in retroperitoneal, pelvic and inguinal regions: -We have reviewed CT scan findings and images. -Physical examination showed bilateral inguinal adenopathy and bilateral small axillary lymphadenopathy right more than left. -Recommend LDH, uric acid and routine labs. -Recommend PET CT scan for further evaluation. -I have recommended biopsy of the groin lymph nodes.  We will make referral to surgery. -RTC after the biopsy.  2.  Generalized itching: -Continue hydroxyzine 10 mg 3 times a day as needed.  3.  Left hydroureteronephrosis: -Her creatinine is normal at 0.83.  She is seeing urology later this month.   All questions were answered. The patient knows to call the clinic with any problems, questions or concerns.   Derek Jack, MD 08/15/20 9:06 AM  [place] [phone]   I, Milinda Antis, am acting as a scribe for Dr. Sanda Linger.  I, Derek Jack MD, have reviewed the above documentation for accuracy and completeness, and I agree with the above.

## 2020-08-16 LAB — BETA 2 MICROGLOBULIN, SERUM: Beta-2 Microglobulin: 0.4 mg/L — ABNORMAL LOW (ref 0.6–2.4)

## 2020-08-20 ENCOUNTER — Other Ambulatory Visit: Payer: Self-pay

## 2020-08-20 ENCOUNTER — Ambulatory Visit (INDEPENDENT_AMBULATORY_CARE_PROVIDER_SITE_OTHER): Payer: Medicare Other | Admitting: General Surgery

## 2020-08-20 ENCOUNTER — Ambulatory Visit: Payer: Medicare Other | Admitting: General Surgery

## 2020-08-20 ENCOUNTER — Encounter: Payer: Self-pay | Admitting: General Surgery

## 2020-08-20 VITALS — BP 163/91 | HR 85 | Temp 97.9°F | Resp 12 | Ht 60.0 in | Wt 113.0 lb

## 2020-08-20 DIAGNOSIS — R591 Generalized enlarged lymph nodes: Secondary | ICD-10-CM

## 2020-08-20 NOTE — Progress Notes (Signed)
Erica Mora; [id number]; Aug 18, 1940   HPI Patient is a 80 year old white female who was referred to my care by Dr. Delton Coombes for lymph node biopsy.  Patient was recently seen by oncology and was found to have diffuse lymphadenopathy in the axilla, pelvis, and inguinal regions.  She is undergoing full work-up.  I have been asked to get tissue diagnosis.  Patient states that she started having lower extremity swelling, right greater than left over the past few months. Past Medical History:  Diagnosis Date  . Prolapse of female bladder, acquired 1973    Past Surgical History:  Procedure Laterality Date  . EYE SURGERY     cataracts- bilateral    . ORIF HUMERUS FRACTURE Left 08/02/2015   Procedure: OPEN REDUCTION INTERNAL FIXATION (ORIF)LEFT DISTAL RADIUS,HUMERUS FRACTURE;  Surgeon: Altamese Bishop Hill, MD;  Location: [place];  Service: Orthopedics;  Laterality: Left;  . TUBAL LIGATION    . VAGINAL DELIVERY      History reviewed. No pertinent family history.  Current Outpatient Medications on File Prior to Visit  Medication Sig Dispense Refill  . anti-nausea (EMETROL) solution Take 10 mLs by mouth every 15 (fifteen) minutes as needed for nausea or vomiting. Reported on 08/30/2015    . calcium carbonate (TUMS EX) 750 MG chewable tablet Chew 1 tablet by mouth daily as needed for heartburn.    . furosemide (LASIX) 20 MG tablet Take 20 mg by mouth every morning.    . hydrochlorothiazide (HYDRODIURIL) 25 MG tablet Take 25 mg by mouth every morning.    Marland Kitchen HYDROcodone-acetaminophen (NORCO/VICODIN) 5-325 MG tablet Take 1-2 tablets by mouth every 6 (six) hours as needed for moderate pain. 90 tablet 0  . hydrOXYzine (ATARAX/VISTARIL) 10 MG tablet Take 10 mg by mouth 3 (three) times daily as needed.    . Multiple Vitamins-Minerals (MULTIVITAMIN WITH MINERALS) tablet Take 1 tablet by mouth daily. Reported on 08/30/2015    . oxyCODONE (ROXICODONE) 5 MG immediate release tablet Take 1-2 tablets (5-10 mg total)  by mouth every 4 (four) hours as needed for severe pain or breakthrough pain. 40 tablet 0   No current facility-administered medications on file prior to visit.    No Known Allergies  Social History   Substance and Sexual Activity  Alcohol Use Yes   Comment: wine glasses per day - 2 per day    Social History   Tobacco Use  Smoking Status Never Smoker  Smokeless Tobacco Never Used    Review of Systems  Constitutional: Negative.   HENT: Negative.   Eyes: Negative.   Respiratory: Negative.   Cardiovascular: Negative.   Gastrointestinal: Positive for heartburn.  Genitourinary: Negative.   Musculoskeletal: Negative.   Skin: Negative.   Neurological: Negative.   Endo/Heme/Allergies: Negative.   Psychiatric/Behavioral: Negative.     Objective   Vitals:   08/20/20 1305  BP: (!) 163/91  Pulse: 85  Resp: 12  Temp: 97.9 F (36.6 C)  SpO2: 97%    Physical Exam Vitals reviewed.  Constitutional:      Appearance: Normal appearance. She is not ill-appearing.  HENT:     Head: Normocephalic and atraumatic.  Cardiovascular:     Rate and Rhythm: Normal rate and regular rhythm.     Heart sounds: Normal heart sounds. No murmur heard. No friction rub. No gallop.   Pulmonary:     Effort: Pulmonary effort is normal. No respiratory distress.     Breath sounds: Normal breath sounds. No stridor. No wheezing, rhonchi or rales.  Abdominal:  Palpations: Abdomen is soft. There is mass.     Tenderness: There is no abdominal tenderness. There is no guarding or rebound.     Hernia: No hernia is present.     Comments: Bilateral inguinal lymphadenopathy.  In the right groin, a large blackhead is present which appears chronic in nature.  It is close to the groin crease.  Skin:    General: Skin is warm and dry.  Neurological:     Mental Status: She is alert and oriented to person, place, and time.   Dr. Tomie China notes reviewed  Assessment  Generalized lymphadenopathy Plan    Patient is scheduled for a left inguinal lymph node biopsy on 08/23/2020.  The risks and benefits of the procedure were fully explained to the patient, who gave informed consent.  We will go for the left inguinal lymph node as the blackhead makes the incidence of infection in the surgical wound higher on the right side.

## 2020-08-20 NOTE — H&P (Signed)
Erica Mora; [id number]; 1940/08/09   HPI Patient is a 80 year old white female who was referred to my care by Dr. Delton Coombes for lymph node biopsy.  Patient was recently seen by oncology and was found to have diffuse lymphadenopathy in the axilla, pelvis, and inguinal regions.  She is undergoing full work-up.  I have been asked to get tissue diagnosis.  Patient states that she started having lower extremity swelling, right greater than left over the past few months. Past Medical History:  Diagnosis Date  . Prolapse of female bladder, acquired 1973    Past Surgical History:  Procedure Laterality Date  . EYE SURGERY     cataracts- bilateral    . ORIF HUMERUS FRACTURE Left 08/02/2015   Procedure: OPEN REDUCTION INTERNAL FIXATION (ORIF)LEFT DISTAL RADIUS,HUMERUS FRACTURE;  Surgeon: Altamese Cavalero, MD;  Location: [facility];  Service: Orthopedics;  Laterality: Left;  . TUBAL LIGATION    . VAGINAL DELIVERY      History reviewed. No pertinent family history.  Current Outpatient Medications on File Prior to Visit  Medication Sig Dispense Refill  . anti-nausea (EMETROL) solution Take 10 mLs by mouth every 15 (fifteen) minutes as needed for nausea or vomiting. Reported on 08/30/2015    . calcium carbonate (TUMS EX) 750 MG chewable tablet Chew 1 tablet by mouth daily as needed for heartburn.    . furosemide (LASIX) 20 MG tablet Take 20 mg by mouth every morning.    . hydrochlorothiazide (HYDRODIURIL) 25 MG tablet Take 25 mg by mouth every morning.    Marland Kitchen HYDROcodone-acetaminophen (NORCO/VICODIN) 5-325 MG tablet Take 1-2 tablets by mouth every 6 (six) hours as needed for moderate pain. 90 tablet 0  . hydrOXYzine (ATARAX/VISTARIL) 10 MG tablet Take 10 mg by mouth 3 (three) times daily as needed.    . Multiple Vitamins-Minerals (MULTIVITAMIN WITH MINERALS) tablet Take 1 tablet by mouth daily. Reported on 08/30/2015    . oxyCODONE (ROXICODONE) 5 MG immediate release tablet Take 1-2 tablets (5-10 mg total)  by mouth every 4 (four) hours as needed for severe pain or breakthrough pain. 40 tablet 0   No current facility-administered medications on file prior to visit.    No Known Allergies  Social History   Substance and Sexual Activity  Alcohol Use Yes   Comment: wine glasses per day - 2 per day    Social History   Tobacco Use  Smoking Status Never Smoker  Smokeless Tobacco Never Used    Review of Systems  Constitutional: Negative.   HENT: Negative.   Eyes: Negative.   Respiratory: Negative.   Cardiovascular: Negative.   Gastrointestinal: Positive for heartburn.  Genitourinary: Negative.   Musculoskeletal: Negative.   Skin: Negative.   Neurological: Negative.   Endo/Heme/Allergies: Negative.   Psychiatric/Behavioral: Negative.     Objective   Vitals:   08/20/20 1305  BP: (!) 163/91  Pulse: 85  Resp: 12  Temp: 97.9 F (36.6 C)  SpO2: 97%    Physical Exam Vitals reviewed.  Constitutional:      Appearance: Normal appearance. She is not ill-appearing.  HENT:     Head: Normocephalic and atraumatic.  Cardiovascular:     Rate and Rhythm: Normal rate and regular rhythm.     Heart sounds: Normal heart sounds. No murmur heard. No friction rub. No gallop.   Pulmonary:     Effort: Pulmonary effort is normal. No respiratory distress.     Breath sounds: Normal breath sounds. No stridor. No wheezing, rhonchi or rales.  Abdominal:  Palpations: Abdomen is soft. There is mass.     Tenderness: There is no abdominal tenderness. There is no guarding or rebound.     Hernia: No hernia is present.     Comments: Bilateral inguinal lymphadenopathy.  In the right groin, a large blackhead is present which appears chronic in nature.  It is close to the groin crease.  Skin:    General: Skin is warm and dry.  Neurological:     Mental Status: She is alert and oriented to person, place, and time.   Dr. Tomie China notes reviewed  Assessment  Generalized lymphadenopathy Plan    Patient is scheduled for a left inguinal lymph node biopsy on 08/23/2020.  The risks and benefits of the procedure were fully explained to the patient, who gave informed consent.  We will go for the left inguinal lymph node as the blackhead makes the incidence of infection in the surgical wound higher on the right side.

## 2020-08-20 NOTE — Patient Instructions (Signed)
Open Lymph Node Biopsy An open lymph node biopsy is a procedure to remove a lymph node so that it can be checked for disease. Lymph nodes are part of the body's disease-fighting system (immune system). The immune system protects the body from infections, germs, and diseases. An open lymph node biopsy may be done to:  Look for germs or cancer cells in your lymph node.  Find out why your lymph node is swollen.  Find out more about a condition you have. Lymph nodes are found in many locations in the body. Biopsies are often done on lymph nodes in the head, neck, armpit, or groin. Tell a health care provider about:  Any allergies you have.  All medicines you are taking, including vitamins, herbs, eye drops, creams, and over-the-counter medicines.  Any problems you or family members have had with anesthetic medicines.  Any blood disorders you have.  Any surgeries you have had.  Any medical conditions you have or have had.  Whether you are pregnant or may be pregnant. What are the risks? Generally, this is a safe procedure. However, problems may occur, including:  Infection.  Bleeding.  Allergic reactions to medicines.  Damage to surrounding structures or organs, such as a nerve.  Scarring. What happens before the procedure? Medicines Ask your health care provider about:  Changing or stopping your regular medicines. This is especially important if you are taking diabetes medicines or blood thinners.  Taking medicines such as aspirin and ibuprofen. These medicines can thin your blood. Do not take these medicines before the procedure unless your health care provider tells you to take them.  Taking over-the-counter medicines, vitamins, herbs, and supplements. Surgery safety Ask your health care provider:  How your surgery site will be marked.  What steps will be taken to help prevent infection. These steps may include: ? Removing hair at the surgery site. ? Washing skin  with a germ-killing soap. ? Receiving antibiotic medicine. General instructions  Follow instructions from your health care provider about eating or drinking restrictions.  You may have an exam or testing.  You may have a blood or urine sample taken.  If you will be going home right after the procedure, plan to have a responsible adult care for you for the time you are told. This is important. What happens during the procedure?  An IV will be inserted into one of your veins.  You will be given one or more of the following: ? A medicine to help you relax (sedative). ? A medicine to numb the area (local anesthetic).  An incision will be made in the area where your lymph node is located.  Your lymph node will be removed.  Your incision will be closed with stitches (sutures).  An antibiotic ointment may be applied to your incision.  A bandage (dressing) will be placed over your incision. The procedure may vary among health care providers and hospitals.   What happens after the procedure?  Your blood pressure, heart rate, breathing rate, and blood oxygen level will be monitored until you leave the hospital or clinic.  Do not drive for 24 hours if you were given a sedative during your procedure.  It is up to you to get the results of your procedure. Ask your health care provider, or the department that is doing the procedure, when your results will be ready. Summary  An open lymph node biopsy is a procedure to remove a lymph node so that it can be checked for   disease.  Generally, this is a safe procedure. However, problems may occur, including bleeding, infection, allergic reaction to medicines, and damage to other structures or organs.  During the procedure, an incision will be made in the area of the lymph node, the lymph node will be removed, and the incision will be closed with sutures.  You will be monitored after the procedure. Do not drive for 24 hours if you were given a  sedative during your procedure. This information is not intended to replace advice given to you by your health care provider. Make sure you discuss any questions you have with your health care provider. Document Revised: 03/14/2020 Document Reviewed: 03/14/2020 Elsevier Patient Education  2021 [employer]  

## 2020-08-21 ENCOUNTER — Encounter (HOSPITAL_COMMUNITY)
Admission: RE | Admit: 2020-08-21 | Discharge: 2020-08-21 | Disposition: A | Discharge status: Home / Self Care | Payer: Medicare Other | Source: Ambulatory Visit | Attending: General Surgery | Admitting: General Surgery

## 2020-08-21 ENCOUNTER — Other Ambulatory Visit: Payer: Self-pay

## 2020-08-21 ENCOUNTER — Encounter (HOSPITAL_COMMUNITY): Payer: Self-pay

## 2020-08-22 ENCOUNTER — Other Ambulatory Visit (HOSPITAL_COMMUNITY)
Admission: RE | Admit: 2020-08-22 | Discharge: 2020-08-22 | Disposition: A | Discharge status: Home / Self Care | Payer: Medicare Other | Source: Ambulatory Visit | Attending: General Surgery | Admitting: General Surgery

## 2020-08-22 DIAGNOSIS — Z01812 Encounter for preprocedural laboratory examination: Secondary | ICD-10-CM | POA: Diagnosis present

## 2020-08-22 DIAGNOSIS — Z20822 Contact with and (suspected) exposure to covid-19: Secondary | ICD-10-CM | POA: Insufficient documentation

## 2020-08-22 LAB — SARS CORONAVIRUS 2 (TAT 6-24 HRS): SARS Coronavirus 2: NEGATIVE

## 2020-08-23 ENCOUNTER — Ambulatory Visit (HOSPITAL_COMMUNITY): Payer: Medicare Other | Admitting: Anesthesiology

## 2020-08-23 ENCOUNTER — Encounter (HOSPITAL_COMMUNITY): Payer: Self-pay | Admitting: General Surgery

## 2020-08-23 ENCOUNTER — Encounter (HOSPITAL_COMMUNITY)
Admission: RE | Disposition: A | Discharge status: Home / Self Care | Payer: Self-pay | Source: Home / Self Care | Attending: General Surgery

## 2020-08-23 ENCOUNTER — Ambulatory Visit (HOSPITAL_COMMUNITY)
Admission: RE | Admit: 2020-08-23 | Discharge: 2020-08-23 | Disposition: A | Discharge status: Home / Self Care | Payer: Medicare Other | Attending: General Surgery | Admitting: General Surgery

## 2020-08-23 DIAGNOSIS — R590 Localized enlarged lymph nodes: Secondary | ICD-10-CM | POA: Diagnosis present

## 2020-08-23 DIAGNOSIS — C8335 Diffuse large B-cell lymphoma, lymph nodes of inguinal region and lower limb: Secondary | ICD-10-CM | POA: Insufficient documentation

## 2020-08-23 DIAGNOSIS — Z79899 Other long term (current) drug therapy: Secondary | ICD-10-CM | POA: Insufficient documentation

## 2020-08-23 DIAGNOSIS — C8245 Follicular lymphoma grade IIIb, lymph nodes of inguinal region and lower limb: Secondary | ICD-10-CM | POA: Insufficient documentation

## 2020-08-23 HISTORY — PX: INGUINAL LYMPH NODE BIOPSY: SHX5865

## 2020-08-23 SURGERY — BIOPSY, LYMPH NODE, INGUINAL, OPEN
Anesthesia: General | Site: Inguinal | Laterality: Left

## 2020-08-23 MED ORDER — ONDANSETRON HCL 4 MG/2ML IJ SOLN: 4.0000 mg | Freq: Once | INTRAMUSCULAR | Status: DC | PRN

## 2020-08-23 MED ORDER — LACTATED RINGERS IV SOLN
INTRAVENOUS | Status: DC
  Administered 2020-08-23: 1000 mL via INTRAVENOUS

## 2020-08-23 MED ORDER — PROPOFOL 10 MG/ML IV BOLUS
INTRAVENOUS | Status: DC | PRN
  Administered 2020-08-23: 30 mg via INTRAVENOUS
  Administered 2020-08-23: 50 mg via INTRAVENOUS

## 2020-08-23 MED ORDER — LIDOCAINE 2% (20 MG/ML) 5 ML SYRINGE
INTRAMUSCULAR | Status: DC | PRN
  Administered 2020-08-23: 50 mg via INTRAVENOUS

## 2020-08-23 MED ORDER — PROPOFOL 10 MG/ML IV BOLUS
INTRAVENOUS | Status: AC
  Filled 2020-08-23: qty 20

## 2020-08-23 MED ORDER — LIDOCAINE HCL (PF) 1 % IJ SOLN
INTRAMUSCULAR | Status: AC
  Filled 2020-08-23: qty 30

## 2020-08-23 MED ORDER — KETOROLAC TROMETHAMINE 30 MG/ML IJ SOLN
INTRAMUSCULAR | Status: DC | PRN
  Administered 2020-08-23: 15 mg via INTRAVENOUS

## 2020-08-23 MED ORDER — DEXAMETHASONE SODIUM PHOSPHATE 10 MG/ML IJ SOLN
INTRAMUSCULAR | Status: AC
  Filled 2020-08-23: qty 1

## 2020-08-23 MED ORDER — CHLORHEXIDINE GLUCONATE 0.12 % MT SOLN
15.0000 mL | Freq: Once | OROMUCOSAL | Status: AC
  Administered 2020-08-23: 15 mL via OROMUCOSAL

## 2020-08-23 MED ORDER — CEFAZOLIN SODIUM-DEXTROSE 2-4 GM/100ML-% IV SOLN
2.0000 g | INTRAVENOUS | Status: AC
  Administered 2020-08-23: 2 g via INTRAVENOUS
  Filled 2020-08-23: qty 100

## 2020-08-23 MED ORDER — CHLORHEXIDINE GLUCONATE CLOTH 2 % EX PADS: 6.0000 | MEDICATED_PAD | Freq: Once | CUTANEOUS | Status: DC

## 2020-08-23 MED ORDER — PHENYLEPHRINE 40 MCG/ML (10ML) SYRINGE FOR IV PUSH (FOR BLOOD PRESSURE SUPPORT)
PREFILLED_SYRINGE | INTRAVENOUS | Status: AC
  Filled 2020-08-23: qty 10

## 2020-08-23 MED ORDER — ONDANSETRON HCL 4 MG PO TABS: 4.0000 mg | ORAL_TABLET | Freq: Three times a day (TID) | ORAL | 1 refills | Status: AC | PRN

## 2020-08-23 MED ORDER — 0.9 % SODIUM CHLORIDE (POUR BTL) OPTIME
TOPICAL | Status: DC | PRN
  Administered 2020-08-23: 1000 mL

## 2020-08-23 MED ORDER — OXYCODONE HCL 5 MG PO TABS: 5.0000 mg | ORAL_TABLET | ORAL | 0 refills | Status: DC | PRN

## 2020-08-23 MED ORDER — FENTANYL CITRATE (PF) 100 MCG/2ML IJ SOLN
INTRAMUSCULAR | Status: DC | PRN
  Administered 2020-08-23 (×2): 50 ug via INTRAVENOUS

## 2020-08-23 MED ORDER — ONDANSETRON HCL 4 MG/2ML IJ SOLN
INTRAMUSCULAR | Status: AC
  Filled 2020-08-23: qty 2

## 2020-08-23 MED ORDER — FENTANYL CITRATE (PF) 100 MCG/2ML IJ SOLN
INTRAMUSCULAR | Status: AC
  Filled 2020-08-23: qty 2

## 2020-08-23 MED ORDER — BUPIVACAINE HCL (PF) 0.5 % IJ SOLN
INTRAMUSCULAR | Status: AC
  Filled 2020-08-23: qty 30

## 2020-08-23 MED ORDER — PHENYLEPHRINE 40 MCG/ML (10ML) SYRINGE FOR IV PUSH (FOR BLOOD PRESSURE SUPPORT)
PREFILLED_SYRINGE | INTRAVENOUS | Status: DC | PRN
  Administered 2020-08-23: 120 ug via INTRAVENOUS
  Administered 2020-08-23 (×2): 80 ug via INTRAVENOUS

## 2020-08-23 MED ORDER — ORAL CARE MOUTH RINSE: 15.0000 mL | Freq: Once | OROMUCOSAL | Status: AC

## 2020-08-23 MED ORDER — KETOROLAC TROMETHAMINE 30 MG/ML IJ SOLN
INTRAMUSCULAR | Status: AC
  Filled 2020-08-23: qty 1

## 2020-08-23 MED ORDER — FENTANYL CITRATE (PF) 100 MCG/2ML IJ SOLN: 25.0000 ug | INTRAMUSCULAR | Status: DC | PRN

## 2020-08-23 MED ORDER — ONDANSETRON HCL 4 MG/2ML IJ SOLN
INTRAMUSCULAR | Status: DC | PRN
  Administered 2020-08-23: 4 mg via INTRAVENOUS

## 2020-08-23 MED ORDER — PROPOFOL 500 MG/50ML IV EMUL
INTRAVENOUS | Status: DC | PRN
  Administered 2020-08-23: 50 ug/kg/min via INTRAVENOUS

## 2020-08-23 MED ORDER — DEXAMETHASONE SODIUM PHOSPHATE 4 MG/ML IJ SOLN
INTRAMUSCULAR | Status: DC | PRN
  Administered 2020-08-23: 4 mg via INTRAVENOUS

## 2020-08-23 MED ORDER — LIDOCAINE HCL (PF) 1 % IJ SOLN
INTRAMUSCULAR | Status: DC | PRN
  Administered 2020-08-23: 3 mL
  Administered 2020-08-23: 5 mL

## 2020-08-23 SURGICAL SUPPLY — 36 items
ADH SKN CLS APL DERMABOND .7 (GAUZE/BANDAGES/DRESSINGS) ×1
APPLIER CLIP 9.375 SM OPEN (CLIP)
APR CLP SM 9.3 20 MLT OPN (CLIP)
CLIP APPLIE 9.375 SM OPEN (CLIP) IMPLANT
CLOTH BEACON ORANGE TIMEOUT ST (SAFETY) ×2 IMPLANT
CNTNR URN SCR LID CUP LEK RST (MISCELLANEOUS) ×1 IMPLANT
CONT SPEC 4OZ STRL OR WHT (MISCELLANEOUS) ×2
COVER LIGHT HANDLE STERIS (MISCELLANEOUS) ×4 IMPLANT
COVER WAND RF STERILE (DRAPES) ×2 IMPLANT
DECANTER SPIKE VIAL GLASS SM (MISCELLANEOUS) ×2 IMPLANT
DERMABOND ADVANCED (GAUZE/BANDAGES/DRESSINGS) ×1
DERMABOND ADVANCED .7 DNX12 (GAUZE/BANDAGES/DRESSINGS) ×1 IMPLANT
ELECT REM PT RETURN 9FT ADLT (ELECTROSURGICAL) ×2
ELECTRODE REM PT RTRN 9FT ADLT (ELECTROSURGICAL) ×1 IMPLANT
GLOVE SURG ENC MOIS LTX SZ6.5 (GLOVE) ×2 IMPLANT
GLOVE SURG UNDER POLY LF SZ6.5 (GLOVE) ×2 IMPLANT
GLOVE SURG UNDER POLY LF SZ7 (GLOVE) ×4 IMPLANT
GOWN STRL REUS W/TWL LRG LVL3 (GOWN DISPOSABLE) ×4 IMPLANT
KIT TURNOVER KIT A (KITS) ×2 IMPLANT
MANIFOLD NEPTUNE II (INSTRUMENTS) ×2 IMPLANT
NEEDLE HYPO 25X1 1.5 SAFETY (NEEDLE) ×2 IMPLANT
NS IRRIG 1000ML POUR BTL (IV SOLUTION) ×2 IMPLANT
PACK MINOR (CUSTOM PROCEDURE TRAY) ×2 IMPLANT
PAD ARMBOARD 7.5X6 YLW CONV (MISCELLANEOUS) ×2 IMPLANT
PAD TELFA 3X4 1S STER (GAUZE/BANDAGES/DRESSINGS) IMPLANT
SET BASIN LINEN APH (SET/KITS/TRAYS/PACK) ×2 IMPLANT
SHEARS HARMONIC 9CM CVD (BLADE) IMPLANT
SPONGE INTESTINAL PEANUT (DISPOSABLE) IMPLANT
SUT MNCRL AB 4-0 PS2 18 (SUTURE) ×2 IMPLANT
SUT SILK 2 0 (SUTURE)
SUT SILK 2-0 18XBRD TIE 12 (SUTURE) IMPLANT
SUT VIC AB 3-0 SH 27 (SUTURE) ×2
SUT VIC AB 3-0 SH 27X BRD (SUTURE) ×1 IMPLANT
SUT VICRYL AB 3 0 TIES (SUTURE) ×2 IMPLANT
SYR BULB IRRIG 60ML STRL (SYRINGE) ×2 IMPLANT
SYR CONTROL 10ML LL (SYRINGE) ×2 IMPLANT

## 2020-08-23 NOTE — Interval H&P Note (Signed)
History and Physical Interval Note:  08/23/2020 10:01 AM  Erica Mora  has presented today for surgery, with the diagnosis of Inguinal Lymphadneopathy.  The various methods of treatment have been discussed with the patient and family. After consideration of risks, benefits and other options for treatment, the patient has consented to  Procedure(s): INGUINAL LYMPH NODE BIOPSY, LEFT (Left) as a surgical intervention.  The patient's history has been reviewed, patient examined, no change in status, stable for surgery.  I have reviewed the patient's chart and labs.  Questions were answered to the patient's satisfaction.    Marked patient. Discussed risk of bleeding, infection, lymph leak, seroma.   Virl Cagey

## 2020-08-23 NOTE — Op Note (Signed)
[facility] Operative Note  08/23/20  Preoperative Diagnosis:  Inguinal Adenopathy    Postoperative Diagnosis: Same   Procedure(s) Performed: Excisional biopsy of left inguinal lymph node    Surgeon: Lanell Matar. Constance Haw, MD   Assistants: No qualified resident was available    Anesthesia: General    Anesthesiologist: Dr. Charna Elizabeth   Specimens: Left inguinal node sent fresh   Estimated Blood Loss: Minimal   Blood Replacement: None    Complications: None   Wound Class: Clean    Operative Indications: Ms. Maranan is a 80 yo with inguinal adenopathy that needs a biopsy to determine the etiology. We discussed excision and risk of bleeding, infection, seroma, lymph leak and un-diagnostic sample, and she opted to proceed.   Findings: Large left inguinal node, superficial    Procedure: The patient was taken to the operating room and placed supine. General endotracheal anesthesia was induced. Intravenous antibiotics were administered per protocol. The left leg was placed in frog leg position with the pressure points padded. The left leg was prepared and draped in the usual sterile fashion.   An infrainguinal incision was made following the tension lines of the skin, and carried down to the subcutaneous tissue with cautery.  Care was used to dissect down to the superficial lymph node which was over 3cm in size.  With careful blunt dissection and dissection with the harmonic scalpel, the lymph node was removed in its entirety.  The cavity was made hemostatic and no signs of bleeding or lymph leak.  The cavity was closed with 3-0 interrupted Vircyl suture and the skin was closed with 4-0 running subcuticular Monocryl and dermabond.    Final inspection revealed acceptable hemostasis. All counts were correct at the end of the case. The patient was awakened from anesthesia without complication.  The patient went to the PACU in stable condition.   Curlene Labrum,  MD [facility] [address] [place], [place] [address] [phone] (office)

## 2020-08-23 NOTE — Anesthesia Procedure Notes (Addendum)
Date/Time: 08/23/2020 10:33 AM Performed by: Orlie Dakin, CRNA Pre-anesthesia Checklist: Patient identified, Emergency Drugs available, Suction available and Patient being monitored Patient Re-evaluated:Patient Re-evaluated prior to induction Oxygen Delivery Method: Non-rebreather mask Induction Type: IV induction Placement Confirmation: positive ETCO2

## 2020-08-23 NOTE — Anesthesia Postprocedure Evaluation (Signed)
Anesthesia Post Note  Patient: Erica Mora  Procedure(s) Performed: INGUINAL LYMPH NODE BIOPSY, LEFT (Left Inguinal)  Patient location during evaluation: PACU Anesthesia Type: General Level of consciousness: awake and alert and oriented Pain management: pain level controlled Vital Signs Assessment: post-procedure vital signs reviewed and stable Respiratory status: spontaneous breathing and respiratory function stable Cardiovascular status: blood pressure returned to baseline and stable Postop Assessment: no apparent nausea or vomiting and adequate PO intake Anesthetic complications: no   No complications documented.   Last Vitals:  Vitals:   08/23/20 1145 08/23/20 1202  BP: 132/78 (!) 152/82  Pulse:  73  Resp: 16 18  Temp:  36.4 C  SpO2: 99% 97%    Last Pain:  Vitals:   08/23/20 1202  TempSrc: Oral  PainSc: 0-No pain                 Caleyah Jr C Aubriee Szeto

## 2020-08-23 NOTE — Progress Notes (Signed)
[facility]  Updated husband in person. Left groin with dermabond and sutures that will dissolve. Will check on wound in few weeks. Will call with the pathology results.  Roxi sent to [facility], can use Tylenol and ibuprofen.   Curlene Labrum, MD [facility] [address] [place], [place] [address] [phone] (office)

## 2020-08-23 NOTE — Discharge Instructions (Signed)
Discharge Instructions:  Common Complaints: Pain at the incision site is common.  Some nausea and poor appetite are common after anesthesia.. The main goal is to stay hydrated the first few days after surgery.   Diet/ Activity: Diet as tolerated. You may not have an appetite, but it is important to stay hydrated. Drink 64 ounces of water a day. Your appetite will return with time.  Shower per your regular routine daily.  Do not take hot showers. Take warm showers that are less than 10 minutes. Do not submerge in water until the wound is completely healed. Do not pick at the glue on the incision.  Walk everyday for at least 15-20 minutes. Deep cough and move around every 1-2 hours in the first few days after surgery.  Limit excessive movement, lifting > 10 lbs, stretching with the limb if there is an incision on your arm/armpit or leg.   Limit stretching, pulling on your incision if it is located on other parts of your body.  Do not pick at the dermabond glue on your incision sites.  This glue film will remain in place for 1-2 weeks and will start to peel off.  Do not place lotions or balms on your incision unless instructed to specifically by Dr. Constance Haw.   Medication: Take tylenol and ibuprofen as needed for pain control, alternating every 4-6 hours.  Example:  Tylenol 1000mg  @ 6am, 12noon, 6pm, 74midnight (Do not exceed 4000mg  of tylenol a day). Ibuprofen 800mg  @ 9am, 3pm, 9pm, 3am (Do not exceed 3600mg  of ibuprofen a day).  Take Roxicodone for breakthrough pain every 4 hours.  Take Colace for constipation related to narcotic pain medication. If you do not have a bowel movement in 2 days, take Miralax over the counter.  Drink plenty of water to also prevent constipation.   Contact Information: If you have questions or concerns, please call our office, [phone], Monday- Thursday 8AM-5PM and Friday 8AM-12Noon.  If it is after hours or on the weekend, please call [facility],  [phone], [phone], and ask to speak to the surgeon on call for Dr. Constance Haw at [facility]   Open Lymph Node Biopsy, Care After The following information offers guidance on how to care for yourself after your procedure. Your health care provider may also give you more specific instructions. If you have problems or questions, contact your health care provider. What can I expect after the procedure? After the procedure, it is common to have:  Bruising.  Soreness.  Mild swelling. Follow these instructions at home: Medicines  Take over-the-counter and prescription medicines only as told by your health care provider.  If you were prescribed an antibiotic medicine, take or use it as told by your health care provider. Do not stop taking the antibiotic even if you start to feel better.  Do not drive or use heavy machinery while taking prescription pain medicine. Incision care  Follow instructions from your health care provider about how to take care of your incision. Make sure you: ? Wash your hands with soap and water for at least 20 seconds before and after you change your bandage (dressing). If soap and water are not available, use hand sanitizer. ? Change your dressing as told by your health care provider. ? Leave stitches (sutures), skin glue, or adhesive strips in place. These skin closures may need to stay in place for 2 weeks or longer. If adhesive strip edges start to loosen and curl up, you may trim the loose  edges. Do not remove adhesive strips completely unless your health care provider tells you to do that.  Check your incision area every day for signs of infection. Check for: ? More redness, swelling, or pain. ? Fluid or blood. ? Warmth. ? Pus or a bad smell.   General instructions  Do not take baths, swim, or use a hot tub until your health care provider approves. You may shower.  If you were given a sedative during the procedure, it can affect you for several  hours. Do not drive or operate machinery until your health care provider says that it is safe.  Return to your normal activities as told by your health care provider. Ask your health care provider what activities are safe for you.  Keep all follow-up visits. This is important. Contact a health care provider if:  You have a fever.  You have increased redness, swelling, or pain around your incision.  You have fluid or blood coming from your incision.  Your incision feels warm to the touch.  You have pus or a bad smell coming from your incision.  You have pain or numbness that gets worse or lasts longer than a few days. Summary  After a lymph node biopsy, it is common to have bruising, soreness, and mild swelling.  Follow your health care provider's instructions about taking care of yourself at home. You will be told how to take medicines, take care of your incision, and check for infection.  Return to your normal activities as told by your health care provider. Ask your health care provider what activities are safe for you.  Contact a health care provider if you have increased redness, swelling, or pain around your incision, you have a fever, or you have worsening pain or numbness. This information is not intended to replace advice given to you by your health care provider. Make sure you discuss any questions you have with your health care provider. Document Revised: 03/14/2020 Document Reviewed: 03/14/2020 Elsevier Patient Education  2021 [place].     Ondansetron oral dissolving tablet What is this medicine? ONDANSETRON (on DAN se tron) is used to treat nausea and vomiting caused by chemotherapy. It is also used to prevent or treat nausea and vomiting after surgery. This medicine may be used for other purposes; ask your health care provider or pharmacist if you have questions. COMMON BRAND NAME(S): Zofran ODT What should I tell my health care provider before I take this  medicine? They need to know if you have any of these conditions:  heart disease  history of irregular heartbeat  liver disease  low levels of magnesium or potassium in the blood  an unusual or allergic reaction to ondansetron, granisetron, other medicines, foods, dyes, or preservatives  pregnant or trying to get pregnant  breast-feeding How should I use this medicine? These tablets are made to dissolve in the mouth. Do not try to push the tablet through the foil backing. With dry hands, peel away the foil backing and gently remove the tablet. Place the tablet in the mouth and allow it to dissolve, then swallow. While you may take these tablets with water, it is not necessary to do so. Talk to your pediatrician regarding the use of this medicine in children. Special care may be needed. Overdosage: If you think you have taken too much of this medicine contact a poison control center or emergency room at once. NOTE: This medicine is only for you. Do not share this medicine  with others. What if I miss a dose? If you miss a dose, take it as soon as you can. If it is almost time for your next dose, take only that dose. Do not take double or extra doses. What may interact with this medicine? Do not take this medicine with any of the following medications:  apomorphine  certain medicines for fungal infections like fluconazole, itraconazole, ketoconazole, posaconazole, voriconazole  cisapride  dronedarone  pimozide  thioridazine This medicine may also interact with the following medications:  carbamazepine  certain medicines for depression, anxiety, or psychotic disturbances  fentanyl  linezolid  MAOIs like Carbex, Eldepryl, Marplan, Nardil, and Parnate  methylene blue (injected into a vein)  other medicines that prolong the QT interval (cause an abnormal heart rhythm) like dofetilide, ziprasidone  phenytoin  rifampicin  tramadol This list may not describe all  possible interactions. Give your health care provider a list of all the medicines, herbs, non-prescription drugs, or dietary supplements you use. Also tell them if you smoke, drink alcohol, or use illegal drugs. Some items may interact with your medicine. What should I watch for while using this medicine? Check with your doctor or health care professional as soon as you can if you have any sign of an allergic reaction. What side effects may I notice from receiving this medicine? Side effects that you should report to your doctor or health care professional as soon as possible:  allergic reactions like skin rash, itching or hives, swelling of the face, lips, or tongue  breathing problems  confusion  dizziness  fast or irregular heartbeat  feeling faint or lightheaded, falls  fever and chills  loss of balance or coordination  seizures  sweating  swelling of the hands and feet  tightness in the chest  tremors  unusually weak or tired Side effects that usually do not require medical attention (report to your doctor or health care professional if they continue or are bothersome):  constipation or diarrhea  headache This list may not describe all possible side effects. Call your doctor for medical advice about side effects. You may report side effects to FDA at 1-800-FDA-1088. Where should I keep my medicine? Keep out of the reach of children. Store between 2 and 30 degrees C (36 and 86 degrees F). Throw away any unused medicine after the expiration date. NOTE: This sheet is a summary. It may not cover all possible information. If you have questions about this medicine, talk to your doctor, pharmacist, or health care provider.  2021 Elsevier/Gold Standard (2018-05-24 07:14:10)  General Anesthesia, Adult, Care After This sheet gives you information about how to care for yourself after your procedure. Your health care provider may also give you more specific instructions. If  you have problems or questions, contact your health care provider. What can I expect after the procedure? After the procedure, the following side effects are common:  Pain or discomfort at the IV site.  Nausea.  Vomiting.  Sore throat.  Trouble concentrating.  Feeling cold or chills.  Feeling weak or tired.  Sleepiness and fatigue.  Soreness and body aches. These side effects can affect parts of the body that were not involved in surgery. Follow these instructions at home: For the time period you were told by your health care provider:  Rest.  Do not participate in activities where you could fall or become injured.  Do not drive or use machinery.  Do not drink alcohol.  Do not take sleeping pills or  medicines that cause drowsiness.  Do not make important decisions or sign legal documents.  Do not take care of children on your own.   Eating and drinking  Follow any instructions from your health care provider about eating or drinking restrictions.  When you feel hungry, start by eating small amounts of foods that are soft and easy to digest (bland), such as toast. Gradually return to your regular diet.  Drink enough fluid to keep your urine pale yellow.  If you vomit, rehydrate by drinking water, juice, or clear broth. General instructions  If you have sleep apnea, surgery and certain medicines can increase your risk for breathing problems. Follow instructions from your health care provider about wearing your sleep device: ? Anytime you are sleeping, including during daytime naps. ? While taking prescription pain medicines, sleeping medicines, or medicines that make you drowsy.  Have a responsible adult stay with you for the time you are told. It is important to have someone help care for you until you are awake and alert.  Return to your normal activities as told by your health care provider. Ask your health care provider what activities are safe for you.  Take  over-the-counter and prescription medicines only as told by your health care provider.  If you smoke, do not smoke without supervision.  Keep all follow-up visits as told by your health care provider. This is important. Contact a health care provider if:  You have nausea or vomiting that does not get better with medicine.  You cannot eat or drink without vomiting.  You have pain that does not get better with medicine.  You are unable to pass urine.  You develop a skin rash.  You have a fever.  You have redness around your IV site that gets worse. Get help right away if:  You have difficulty breathing.  You have chest pain.  You have blood in your urine or stool, or you vomit blood. Summary  After the procedure, it is common to have a sore throat or nausea. It is also common to feel tired.  Have a responsible adult stay with you for the time you are told. It is important to have someone help care for you until you are awake and alert.  When you feel hungry, start by eating small amounts of foods that are soft and easy to digest (bland), such as toast. Gradually return to your regular diet.  Drink enough fluid to keep your urine pale yellow.  Return to your normal activities as told by your health care provider. Ask your health care provider what activities are safe for you. This information is not intended to replace advice given to you by your health care provider. Make sure you discuss any questions you have with your health care provider. Document Revised: 02/15/2020 Document Reviewed: 09/14/2019 Elsevier Patient Education  2021 [place].    Oxycodone Capsules or Tablets What is this medicine? OXYCODONE (ox i KOE done) is a pain reliever, also called an opioid. It treats severe pain. This medicine may be used for other purposes; ask your health care provider or pharmacist if you have questions. COMMON BRAND NAME(S): Dazidox, Endocodone, Oxaydo, OXECTA, OxyIR,  Percolone, Roxicodone, Roxybond What should I tell my health care provider before I take this medicine? They need to know if you have any of these conditions:  brain tumor  drug abuse or addiction  head injury  heart disease  if you often drink alcohol  kidney disease  liver disease  low adrenal gland function  lung disease, asthma, or breathing problem  seizures  stomach or intestine problems  taken an MAOI such as Marplan, Nardil, or Parnate in the last 14 days  an unusual or allergic reaction to oxycodone, other drugs, foods, dyes, or preservatives  pregnant or trying to get pregnant  breast-feeding How should I use this medicine? Take this medicine by mouth with water. Take it as directed on the prescription label at the same time every day. You can take it with or without food. If it upsets your stomach, take it with food. Keep taking it unless your health care provider tells you to stop. Some brands of this medicine, like Oxaydo, have special instructions. Ask your doctor or pharmacist if these directions are for you: Do not cut, crush or chew this medicine. Do not wet, soak, or lick the tablet before you take it. A special MedGuide will be given to you by the pharmacist with each prescription and refill. Be sure to read this information carefully each time. Talk to your pediatrician regarding the use of this medicine in children. Special care may be needed. Overdosage: If you think you have taken too much of this medicine contact a poison control center or emergency room at once. NOTE: This medicine is only for you. Do not share this medicine with others. What if I miss a dose? If you miss a dose, take it as soon as you can. If it is almost time for your next dose, take only that dose. Do not take double or extra doses. What may interact with this medicine? Do not take this medicine with any of the following medications:  safinamide This medicine may interact with  the following medications:  alcohol  antihistamines for allergy, cough, and cold  atropine  certain antivirals for HIV or hepatitis  certain antibiotics like clarithromycin, erythromycin, linezolid, rifampin  certain medicines for anxiety or sleep  certain medicines for bladder problems like oxybutynin, tolterodine  certain medicines for depression like amitriptyline, fluoxetine, sertraline  certain medicines for fungal infections like ketoconazole, itraconazole, posaconazole  certain medicines for migraine headache like almotriptan, eletriptan, frovatriptan, naratriptan, rizatriptan, sumatriptan, zolmitriptan  certain medicines for nausea or vomiting like dolasetron, granisetron, ondansetron, palonosetron  certain medicines for Parkinson's disease like benztropine, trihexyphenidyl  certain medicines for seizures like carbamazepine, phenobarbital, phenytoin, primidone  certain medicines for stomach problems like dicyclomine, hyoscyamine  certain medicines for travel sickness like scopolamine  diuretics  general anesthetics like halothane, isoflurane, methoxyflurane, propofol  ipratropium  MAOIs like Marplan, Nardil, and Parnate  medicines that relax muscles  methylene blue  other narcotic medicines for pain or cough  phenothiazines like chlorpromazine, mesoridazine, prochlorperazine, thioridazine This list may not describe all possible interactions. Give your health care provider a list of all the medicines, herbs, non-prescription drugs, or dietary supplements you use. Also tell them if you smoke, drink alcohol, or use illegal drugs. Some items may interact with your medicine. What should I watch for while using this medicine? Tell your health care provider if your pain does not go away, if it gets worse, or if you have new or a different type of pain. You may develop tolerance to this medicine. Tolerance means that you will need a higher dose of the medicine for  pain relief. Tolerance is normal and is expected if you take this medicine for a long time. Do not suddenly stop taking your medicine because you may develop a severe reaction. Your  body becomes used to the medicine. This does NOT mean you are addicted. Addiction is a behavior related to getting and using a medicine for a nonmedical reason. If you have pain, you have a medical reason to take pain medicine. Your health care provider will tell you how much medicine to take. If your health care provider wants you to stop the medicine, the dose will be slowly lowered over time to avoid any side effects. If you take other medicines that also cause drowsiness such as other narcotic pain medicines, benzodiazepines, or other medicines for sleep, you may have more side effects. Give your health care provider a list of all medicines you use. He or she will tell you how much medicine to take. Do not take more medicine than directed. Get emergency help right away if you have trouble breathing or are unusually tired or sleepy. Talk to your health care provider about naloxone and how to get it. Naloxone is an emergency medicine used for an opioid overdose. An overdose can happen if you take too much opioid. It can also happen if an opioid is taken with some other medicines or substances, such as alcohol. Know the symptoms of an overdose, such as trouble breathing, unusually tired or sleepy, or not being able to respond or wake up. Make sure to tell caregivers and close contacts where it is stored. Make sure they know how to use it. After naloxone is given, you must get emergency help right away. Naloxone is a temporary treatment. Repeat doses may be needed. You may get drowsy or dizzy. Do not drive, use machinery, or do anything that needs mental alertness until you know how this medicine affects you. Do not stand up or sit up quickly, especially if you are an older patient. This reduces the risk of dizzy or fainting spells.  Alcohol may interfere with the effect of this medicine. Avoid alcoholic drinks. This medicine will cause constipation. If you do not have a bowel movement for 3 days, call your health care provider. Your mouth may get dry. Chewing sugarless gum or sucking hard candy and drinking plenty of water may help. Contact your health care provider if the problem does not go away or is severe. What side effects may I notice from receiving this medicine? Side effects that you should report to your doctor or health care professional as soon as possible:  allergic reactions (skin rash, itching or hives; swelling of the face, lips, or tongue)  confusion  kidney injury (trouble passing urine or change in the amount of urine)  low adrenal gland function (nausea; vomiting; loss of appetite; unusually weak or tired; dizziness; low blood pressure)  low blood pressure (dizziness; feeling faint or lightheaded, falls; unusually weak or tired)  serotonin syndrome (irritable; confusion; diarrhea; fast or irregular heartbeat; muscle twitching; stiff muscles; trouble walking; sweating; high fever; seizures; chills; vomiting)  trouble breathing Side effects that usually do not require medical attention (report to your doctor or health care professional if they continue or are bothersome):  constipation  dry mouth  nausea, vomiting  tiredness This list may not describe all possible side effects. Call your doctor for medical advice about side effects. You may report side effects to FDA at 1-800-FDA-1088. Where should I keep my medicine? Keep out of the reach of children and pets. This medicine can be abused. Keep it in a safe place to protect it from theft. Do not share it with anyone. It is only for you. Selling  or giving away this medicine is dangerous and against the law. Store at [employer] C (77 degrees F). Protect from light and moisture. Keep the container tightly closed. Get rid of any unused medicine after  the expiration date. This medicine may cause harm and death if it is taken by other adults, children, or pets. It is important to get rid of the medicine as soon as you no longer need it or it is expired. You can do this in two ways:  Take the medicine to a medicine take-back program. Check with your pharmacy or law enforcement to find a location.  If you cannot return the medicine, flush it down the toilet. NOTE: This sheet is a summary. It may not cover all possible information. If you have questions about this medicine, talk to your doctor, pharmacist, or health care provider.  2021 Elsevier/Gold Standard (2020-04-22 13:26:06)

## 2020-08-23 NOTE — Transfer of Care (Signed)
Immediate Anesthesia Transfer of Care Note  Patient: Erica Mora  Procedure(s) Performed: INGUINAL LYMPH NODE BIOPSY, LEFT (Left Inguinal)  Patient Location: PACU  Anesthesia Type:General  Level of Consciousness: awake, alert  and oriented  Airway & Oxygen Therapy: Patient Spontanous Breathing  Post-op Assessment: Report given to RN, Post -op Vital signs reviewed and stable and Patient moving all extremities X 4  Post vital signs: Reviewed and stable  Last Vitals:  Vitals Value Taken Time  BP    Temp    Pulse    Resp    SpO2      Last Pain:  Vitals:   08/23/20 0926  TempSrc: Oral  PainSc: 0-No pain      Patients Stated Pain Goal: 7 (09/32/67 1245)  Complications: No complications documented.

## 2020-08-23 NOTE — Anesthesia Preprocedure Evaluation (Addendum)
Anesthesia Evaluation  Patient identified by MRN, date of birth, ID band Patient awake    Reviewed: Allergy & Precautions, NPO status , Patient's Chart, lab work & pertinent test results  History of Anesthesia Complications Negative for: history of anesthetic complications  Airway Mallampati: II  TM Distance: >3 FB Neck ROM: Full    Dental  (+) Upper Dentures, Lower Dentures   Pulmonary neg pulmonary ROS,    Pulmonary exam normal breath sounds clear to auscultation       Cardiovascular Exercise Tolerance: Good Normal cardiovascular exam Rhythm:Regular Rate:Normal     Neuro/Psych negative neurological ROS  negative psych ROS   GI/Hepatic negative GI ROS, (+)     substance abuse  alcohol use,   Endo/Other  negative endocrine ROS  Renal/GU negative Renal ROS  negative genitourinary   Musculoskeletal negative musculoskeletal ROS (+)   Abdominal   Peds negative pediatric ROS (+)  Hematology negative hematology ROS (+)   Anesthesia Other Findings   Reproductive/Obstetrics negative OB ROS                           Anesthesia Physical Anesthesia Plan  ASA: II  Anesthesia Plan: General   Post-op Pain Management:    Induction: Intravenous  PONV Risk Score and Plan: 3 and Ondansetron  Airway Management Planned: Nasal Cannula, Natural Airway and Simple Face Mask  Additional Equipment:   Intra-op Plan:   Post-operative Plan:   Informed Consent: I have reviewed the patients History and Physical, chart, labs and discussed the procedure including the risks, benefits and alternatives for the proposed anesthesia with the patient or authorized representative who has indicated his/her understanding and acceptance.       Plan Discussed with: CRNA and Surgeon  Anesthesia Plan Comments:       Anesthesia Quick Evaluation

## 2020-08-26 ENCOUNTER — Ambulatory Visit (HOSPITAL_COMMUNITY)
Admission: RE | Admit: 2020-08-26 | Discharge: 2020-08-26 | Disposition: A | Discharge status: Home / Self Care | Payer: Medicare Other | Source: Ambulatory Visit | Attending: Hematology | Admitting: Hematology

## 2020-08-26 ENCOUNTER — Other Ambulatory Visit: Payer: Self-pay

## 2020-08-26 ENCOUNTER — Encounter (HOSPITAL_COMMUNITY): Payer: Self-pay | Admitting: General Surgery

## 2020-08-26 DIAGNOSIS — N1330 Unspecified hydronephrosis: Secondary | ICD-10-CM | POA: Diagnosis not present

## 2020-08-26 DIAGNOSIS — R591 Generalized enlarged lymph nodes: Secondary | ICD-10-CM | POA: Diagnosis not present

## 2020-08-26 DIAGNOSIS — N8110 Cystocele, unspecified: Secondary | ICD-10-CM | POA: Diagnosis not present

## 2020-08-26 DIAGNOSIS — R590 Localized enlarged lymph nodes: Secondary | ICD-10-CM | POA: Diagnosis present

## 2020-08-26 MED ORDER — FLUDEOXYGLUCOSE F - 18 (FDG) INJECTION
7.0800 | Freq: Once | INTRAVENOUS | Status: AC | PRN
  Administered 2020-08-26: 7.08 via INTRAVENOUS

## 2020-08-27 ENCOUNTER — Telehealth (INDEPENDENT_AMBULATORY_CARE_PROVIDER_SITE_OTHER): Payer: Medicare Other | Admitting: General Surgery

## 2020-08-27 DIAGNOSIS — R591 Generalized enlarged lymph nodes: Secondary | ICD-10-CM

## 2020-08-27 LAB — SURGICAL PATHOLOGY

## 2020-08-27 NOTE — Telephone Encounter (Signed)
[facility]  Incision is doing well but having some irritation from her underwear.   Notified patient of the lymphoma results.  FINAL MICROSCOPIC DIAGNOSIS:   A. 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 There is brisk mitotic activity and admixed apoptotic debris. The lymph node capsule is thickened. A rim of more normal-appearing lymph node architecture is focally present. By immunohistochemistry, CD20 highlights the atypical nodular proliferation. The B-cells co-express BCL6, BCL2, MUM1 (subset), and CD30 (subset). They are negative for CD10, CD5, cyclin D1, and ALK. The Ki67 proliferative rate is approximately 70%. CD21 highlights preserved follicular dendritic cell meshworks in much of the B-cell proliferation, however, patchy areas reveal absence of meshworks. CD3 highlights small T-cells in the background. EBV is negative by in situ hybridization. Together, the findings are consistent with diffuse large B-cell lymphoma (10-20%)  arising in a high-grade follicular lymphoma (grade 3B of 3; 80-90%).  FISH for BCL2, BCL6, and MYC rearrangements will be performed.  Curlene Labrum, MD [facility] [address] [place], [place] [address] [phone] (office)

## 2020-08-29 ENCOUNTER — Other Ambulatory Visit: Payer: Self-pay

## 2020-08-29 ENCOUNTER — Inpatient Hospital Stay (HOSPITAL_BASED_OUTPATIENT_CLINIC_OR_DEPARTMENT_OTHER): Payer: Medicare Other | Admitting: Hematology

## 2020-08-29 VITALS — BP 148/70 | HR 87 | Temp 96.8°F | Resp 18 | Wt 114.5 lb

## 2020-08-29 DIAGNOSIS — C8338 Diffuse large B-cell lymphoma, lymph nodes of multiple sites: Secondary | ICD-10-CM | POA: Diagnosis not present

## 2020-08-29 DIAGNOSIS — C8330 Diffuse large B-cell lymphoma, unspecified site: Secondary | ICD-10-CM

## 2020-08-29 DIAGNOSIS — Z5112 Encounter for antineoplastic immunotherapy: Secondary | ICD-10-CM | POA: Diagnosis not present

## 2020-08-29 MED ORDER — ALLOPURINOL 300 MG PO TABS: 300.0000 mg | ORAL_TABLET | Freq: Every day | ORAL | 2 refills | Status: AC

## 2020-08-29 MED ORDER — ALPRAZOLAM 0.25 MG PO TABS: 0.1250 mg | ORAL_TABLET | Freq: Every evening | ORAL | 0 refills | Status: DC | PRN

## 2020-08-29 NOTE — Progress Notes (Signed)

## 2020-08-29 NOTE — Progress Notes (Signed)
[place], [place] [address]   CLINIC:  Medical Oncology/Hematology  PCP:  Asencion Noble, MD [address] / [place] [address]  [phone]  REASON FOR VISIT:  Follow-up for diffuse lymphadenopathy  PRIOR THERAPY: None  CURRENT THERAPY: Under work-up  INTERVAL HISTORY:  Erica Mora, a 80 y.o. female, returns for routine follow-up for her diffuse lymphadenopathy. Erica Mora was last seen on 08/15/2020.  Today she is accompanied by her sister.  She had PET CT scan done along with biopsy of the left inguinal lymph node.  She reports tight feeling in the legs although she started on Lasix.  She also reports having difficulty sleeping.    REVIEW OF SYSTEMS:  Review of Systems  Gastrointestinal: Positive for constipation.  Psychiatric/Behavioral: Positive for depression and sleep disturbance.  All other systems reviewed and are negative.   PAST MEDICAL/SURGICAL HISTORY:  Past Medical History:  Diagnosis Date  . Prolapse of female bladder, acquired 1973   Past Surgical History:  Procedure Laterality Date  . EYE SURGERY     cataracts- bilateral    . INGUINAL LYMPH NODE BIOPSY Left 08/23/2020   Procedure: INGUINAL LYMPH NODE BIOPSY, LEFT;  Surgeon: Virl Cagey, MD;  Location: [facility];  Service: General;  Laterality: Left;  . ORIF HUMERUS FRACTURE Left 08/02/2015   Procedure: OPEN REDUCTION INTERNAL FIXATION (ORIF)LEFT DISTAL RADIUS,HUMERUS FRACTURE;  Surgeon: Altamese Flora, MD;  Location: [facility];  Service: Orthopedics;  Laterality: Left;  . TUBAL LIGATION    . VAGINAL DELIVERY      SOCIAL HISTORY:  Social History   Socioeconomic History  . Marital status: Married    Spouse name: Not on file  . Number of children: Not on file  . Years of education: Not on file  . Highest education level: Not on file  Occupational History  . Not on file  Tobacco Use  . Smoking status: Never Smoker  . Smokeless tobacco: Never  Used  Vaping Use  . Vaping Use: Never used  Substance and Sexual Activity  . Alcohol use: Yes    Comment: wine glasses per day - 2 per day  . Drug use: No  . Sexual activity: Not Currently  Other Topics Concern  . Not on file  Social History Narrative  . Not on file   Social Determinants of Health   Financial Resource Strain: Not on file  Food Insecurity: Not on file  Transportation Needs: Not on file  Physical Activity: Not on file  Stress: Not on file  Social Connections: Not on file  Intimate Partner Violence: Not At Risk  . Fear of Current or Ex-Partner: No  . Emotionally Abused: No  . Physically Abused: No  . Sexually Abused: No    FAMILY HISTORY:  No family history on file.  CURRENT MEDICATIONS:  Current Outpatient Medications  Medication Sig Dispense Refill  . Doxylamine Succinate, Sleep, (SLEEP AID PO) Take 1 tablet by mouth at bedtime.    . hydrOXYzine (ATARAX/VISTARIL) 10 MG tablet Take 10 mg by mouth 3 (three) times daily as needed for anxiety or itching.    . levocetirizine (XYZAL) 5 MG tablet Take 2.5 mg by mouth daily.    . naproxen sodium (ALEVE) 220 MG tablet Take 110 mg by mouth 2 (two) times daily as needed (pain).    . ondansetron (ZOFRAN) 4 MG tablet Take 1 tablet (4 mg total) by mouth every 8 (eight) hours as needed. 30 tablet  1  . oxyCODONE (ROXICODONE) 5 MG immediate release tablet Take 1 tablet (5 mg total) by mouth every 4 (four) hours as needed for severe pain. 8 tablet 0   No current facility-administered medications for this visit.    ALLERGIES:  No Known Allergies  PHYSICAL EXAM:  Performance status (ECOG): 1 - Symptomatic but completely ambulatory  Vitals:   08/29/20 1414  BP: (!) 148/70  Pulse: 87  Resp: 18  Temp: (!) 96.8 F (36 C)  SpO2: 99%   Wt Readings from Last 3 Encounters:  08/29/20 114 lb 8 oz (51.9 kg)  08/21/20 113 lb (51.3 kg)  08/20/20 113 lb (51.3 kg)   Physical Exam Vitals reviewed.  Constitutional:       Appearance: Normal appearance.  Cardiovascular:     Rate and Rhythm: Regular rhythm.     Heart sounds: Normal heart sounds.  Pulmonary:     Effort: Pulmonary effort is normal.     Breath sounds: Normal breath sounds.  Musculoskeletal:     Right lower leg: Edema present.     Left lower leg: Edema present.  Skin:    General: Skin is warm.  Neurological:     General: No focal deficit present.     Mental Status: She is alert and oriented to person, place, and time.  Psychiatric:        Mood and Affect: Mood normal.        Behavior: Behavior normal.     LABORATORY DATA:  I have reviewed the labs as listed.  CBC Latest Ref Rng & Units 08/15/2020 08/01/2015  WBC 4.0 - 10.5 K/uL 7.8 8.5  Hemoglobin 12.0 - 15.0 g/dL 10.5(L) 10.8(L)  Hematocrit 36.0 - 46.0 % 33.3(L) 34.0(L)  Platelets 150 - 400 K/uL 222 255   CMP Latest Ref Rng & Units 08/15/2020 08/01/2015  Glucose 70 - 99 mg/dL 99 90  BUN 8 - 23 mg/dL 8 6  Creatinine 0.44 - 1.00 mg/dL 0.83 0.94  Sodium 135 - 145 mmol/L 134(L) 140  Potassium 3.5 - 5.1 mmol/L 4.3 3.3(L)  Chloride 98 - 111 mmol/L 95(L) 100(L)  CO2 22 - 32 mmol/L 29 28  Calcium 8.9 - 10.3 mg/dL 9.0 9.6  Total Protein 6.5 - 8.1 g/dL 7.0 7.0  Total Bilirubin 0.3 - 1.2 mg/dL 0.8 0.6  Alkaline Phos 38 - 126 U/L 122 91  AST 15 - 41 U/L 16 26  ALT 0 - 44 U/L 9 13(L)      Component Value Date/Time   RBC 3.78 (L) 08/15/2020 0934   MCV 88.1 08/15/2020 0934   MCH 27.8 08/15/2020 0934   MCHC 31.5 08/15/2020 0934   RDW 14.7 08/15/2020 0934   LYMPHSABS 0.7 08/15/2020 0934   MONOABS 1.0 08/15/2020 0934   EOSABS 0.5 08/15/2020 0934   BASOSABS 0.1 08/15/2020 0934   Surgical pathology ([id number]) on 08/23/2020: Left inguinal lymph node biopsy: 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  DIAGNOSTIC IMAGING:  I have independently reviewed the scans and discussed with the patient. CT ABDOMEN PELVIS WO CONTRAST  Result Date: 08/02/2020 CLINICAL DATA:  Bladder issues EXAM: CT  ABDOMEN AND PELVIS WITHOUT CONTRAST TECHNIQUE: Multidetector CT imaging of the abdomen and pelvis was performed following the standard protocol without IV contrast. COMPARISON:  None. FINDINGS: Lower chest: RIGHT middle lobe atelectasis. Hepatobiliary: Incompletely assessed hypodense lesion of the inferior LEFT liver measures 9 mm and incompletely assessed and too small to accurately characterize (series 5, image 19; series 2, image 17). Gallbladder is  unremarkable. Pancreas: No peripancreatic fat stranding. Spleen: Unremarkable. Adrenals/Urinary Tract: Adrenal glands are unremarkable. There is moderate LEFT hydroureteronephrosis with prominent dilation of the LEFT renal pelvis. Ureter is dilated throughout its course to the LEFT UVJ. No obstructive ureterolithiasis. The bladder is prolapsed into the vulva with LEFT UVJ below the level of the perineum (series 2, image 80). No RIGHT-sided hydronephrosis. The urethra is not well visualized. Bladder is mildly thick-walled. Stomach/Bowel: No evidence of bowel obstruction. Moderate to large colonic stool burden throughout the colon. Appendix is unremarkable. Vascular/Lymphatic: Moderate atherosclerotic calcifications. There is retroperitoneal, pelvic and inguinal lymphadenopathy. Aortocaval lymph node measures 12 mm in the short axis (series 2, image 27). Representative RIGHT pelvic sidewall lymph node measures 14 mm in the short axis (series 2, image 62). RIGHT inguinal lymph node measures 21 mm in the short axis (series 2, image 84). LEFT pelvic sidewall lymph node measures 15 mm in the short axis (series 2, image 61). Reproductive: Uterus is present. Other: Fat stranding along the pannus. Small volume fluid tracking along the pannus. No intra-abdominal free air or free fluid. Musculoskeletal: Mild compression fracture deformity of L1. This is age indeterminate. There is mild retropulsion of the superior endplate by 3-4 mm. IMPRESSION: 1. Moderate LEFT  hydroureteronephrosis with prominent dilation of the LEFT renal pelvis. No obstructive ureterolithiasis. The bladder is prolapsed into the vulva with LEFT UVJ below the level of the perineum. Obstruction may be due to prolapse physiology. The urethra is not well visualized. 2. Retroperitoneal, pelvic, and inguinal lymphadenopathy. This is nonspecific but given diffuse nature, this is concerning for an underlying lymphoproliferative disorder. Consider further evaluation with dedicated tissue sampling. 3. Mild compression fracture deformity of L1 is age indeterminate. Recommend correlation with point tenderness. 4. Moderate to large colonic stool burden. These results will be called to the ordering clinician or representative by the Radiologist Assistant, and communication documented in the PACS or [employer]. Electronically Signed   By: Valentino Saxon MD   On: 08/02/2020 14:51   NM PET Image Initial (PI) Skull Base To Thigh  Result Date: 08/27/2020 CLINICAL DATA:  Initial treatment strategy for diffuse lymphadenopathy. EXAM: NUCLEAR MEDICINE PET SKULL BASE TO THIGH TECHNIQUE: 7.1 mCi F-18 FDG was injected intravenously. Full-ring PET imaging was performed from the skull base to thigh after the radiotracer. CT data was obtained and used for attenuation correction and anatomic localization. Fasting blood glucose: 69 mg/dl COMPARISON:  CT abdomen/pelvis dated 07/31/2020 FINDINGS: Mediastinal blood pool activity: SUV max 1.9 Liver activity: SUV max 2.4 NECK: Small bilateral supraclavicular nodes, measuring up to 10 mm short axis on the right (series 3/image 69), max SUV 20.5. Incidental CT findings: none CHEST: Mediastinal lymphadenopathy, including: --18 mm short axis AP window node (series 3/image 96), max SUV 13.7 --11 mm short axis prevascular node (series 3/image 103), max SUV 18.0 --10 mm short axis low right pretracheal node (series 3/image 103), max SUV 6.4 Bilateral subpectoral/axillary  lymphadenopathy, including a 7 mm short axis left chest wall node (series 3/image 86) with max SUV 7.2 and a 6 mm short axis right chest wall node (series 3/image 88) with max SUV 6.1. No suspicious pulmonary nodules. Incidental CT findings: Mild atherosclerotic calcifications of the aortic arch. ABDOMEN/PELVIS: Retroperitoneal/pelvic lymphadenopathy, including: --11 mm short axis aortocaval node (series 3/image 185), max SUV 10.2 --10 mm short axis right common iliac node (series 3/image 206), max SUV 11.5 --16 mm short axis bilateral pelvic sidewall nodes (series 3/images 254-255), max SUV 12.4 on the  left and 19.2 on the right --Bilateral inguinal lymphadenopathy, right greater than left, including a dominant 19 mm short axis right inguinal node (series 3/image 268), max SUV 18.5 Spleen is normal in size, without focal lesion. Reference splenic hypermetabolism 3.7. No abnormal metabolism in the liver, spleen, pancreas, or adrenal glands. Incidental CT findings: Bladder prolapse. Associated moderate left hydroureteronephrosis with dilatation of the left ureter likely to the prolapsed pelvic floor (series 3/image 275). Superimposed large left extrarenal pelvis (series 3/image 189). Tiny hiatal hernia. Atherosclerotic calcifications the abdominal aorta. Left colonic diverticulosis, without evidence of diverticulitis. SKELETON: No focal hypermetabolic activity to suggest skeletal metastasis. Incidental CT findings: Mild degenerative changes of the visualized thoracolumbar spine. Subcutaneous edema in the lower pelvis and bilateral thighs. Suspected postsurgical changes with soft tissue gas in the left inguinal region. IMPRESSION: Supraclavicular, thoracic, and abdominopelvic lymphadenopathy, including dominant right inguinal nodes, as above. This appearance is suspicious for lymphoma. Correlate with pending surgical pathology results. Spleen is normal in size, without focal lesion. Bladder prolapse with associated  moderate left hydroureteronephrosis and superimposed large extrarenal pelvis. Electronically Signed   By: Julian Hy M.D.   On: 08/27/2020 11:05     ASSESSMENT:  1.  Diffuse lymphadenopathy in retroperitoneal, pelvic and inguinal regions: -Presentation with leg swelling starting end of October 2021 -Evaluated at urgent care on 05/15/2020 with leg Doppler-no DVT but showed enlarged lymph nodes in the right groin. -Was evaluated by Dr. Willey Blade and a CT scan [facility] was ordered. -CTAP on 07/31/2020 showed retroperitoneal, pelvic and inguinal adenopathy with mild compression fracture deformity of L1 age-indeterminate.  Moderate left hydroureteronephrosis with prominent dilatation of the left renal pelvis. -PET scan on 08/26/2020 shows supraclavicular, thoracic, abdominal pelvic lymphadenopathy including dominant right inguinal lymph nodes.  Spleen is normal without focal lesion.  No focal hypermetabolic activity to suggest bone mets.  Bladder prolapse with associated moderate left hydroureteronephrosis and superimposed large extrarenal pelvis. 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  2.  Social/family history: -She worked as a Art therapist prior to retirement.  She was a never smoker. -She drinks wine occasionally. -Sister died of ovarian cancer at age 27.  Mother died of possible pancreatic cancer.   PLAN:  1.  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  There is hypermetabolic lymphadenopathy on both sides of the diaphragm. -We reviewed pathology report from left inguinal lymph node biopsy which showed diffuse large B-cell lymphoma arising from follicular lymphoma. -We will request high risk lymphoma work-up for BCL2, BCL6 and MYC rearrangements. -Recommend bone marrow biopsy to see involvement by  large B cell/high-grade follicular lymphoma.  PET scan did not show evidence of bone lesions. -Recommend port placement ASAP. -Recommend echocardiogram, hepatitis B and C serology.  2.  Generalized itching: -When she gets anxious, she has itching mostly in the back and left shoulder region. -Continue hydroxyzine 10 mg 3 times a day as needed.  3.  Left hydroureteronephrosis: -She has a normal creatinine.  PET scan showed bladder prolapse with associated moderate left hydroureteronephrosis.  Follow-up with urology.  4.  TLS prophylaxis: -Uric acid is elevated at 7.6.  Potassium is 4.3. -We will start her on allopurinol 300 mg daily.  She will also require rasburicase  with chemotherapy.  5.  Anxiety: -We will give her prescription for Xanax 0.25 mg, to take  half tablet at bedtime.  Orders placed this encounter:  No orders of the defined types were placed in this encounter.  Total time spent is 40 minutes with more than 50% of the time spent face-to-face discussing scan results, biopsy results, further work-up, treatment plan, counseling and coordination of care.  Derek Jack, MD [place] [phone]   I, Milinda Antis, am acting as a scribe for Dr. Sanda Linger.  I, Derek Jack MD, have reviewed the above documentation for accuracy and completeness, and I agree with the above.

## 2020-08-29 NOTE — Patient Instructions (Addendum)
[place] at [facility] Discharge Instructions  You were seen today by Dr. Delton Coombes.   Dr. Delton Coombes has reviewed your recent biopsy and PET scan results. This revealed a type of lymphoma known as Large B Cell arising from follicular Lymphoma, currently in Stage III. This is a fast growing, Non-Hodgkin type of lymphoma (cancer). Dr. Delton Coombes has recommended treating this as soon as possible. This is curable. This cancer responds very well to treatment.  Prior to treatment Dr. Delton Coombes has recommended a Bone Marrow Biopsy, echocardiogram and a Port-A-Cath. A Bone Marrow Biopsy to see if there is any bone marrow involvement. The bones appear clear on the PET scan but this type of lymphoma can impact the bone marrow. Dr. Constance Haw can place a Port-A-Cath.  Follow-up as scheduled  Thank you for choosing [place] at [facility] to provide your oncology and hematology care.  To afford each patient quality time with our provider, please arrive at least 15 minutes before your scheduled appointment time.   If you have a lab appointment with the [facility] please come in thru the Main Entrance and check in at the main information desk  You need to re-schedule your appointment should you arrive 10 or more minutes late.  We strive to give you quality time with our providers, and arriving late affects you and other patients whose appointments are after yours.  Also, if you no show three or more times for appointments you may be dismissed from the clinic at the providers discretion.     Again, thank you for choosing [facility].  Our hope is that these requests will decrease the amount of time that you wait before being seen by our physicians.       _____________________________________________________________  Should you have questions after your visit to [facility], please contact our office at [phone] between the  hours of 8:00 a.m. and 4:30 p.m.  Voicemails left after 4:00 p.m. will not be returned until the following business day.  For prescription refill requests, have your pharmacy contact our office and allow 72 hours.    Cancer Center Support Programs:   > Cancer Support Group  2nd Tuesday of the month 1pm-2pm, Journey Room

## 2020-09-02 ENCOUNTER — Other Ambulatory Visit (HOSPITAL_COMMUNITY): Payer: Self-pay

## 2020-09-02 ENCOUNTER — Encounter (HOSPITAL_COMMUNITY)
Admission: RE | Admit: 2020-09-02 | Discharge: 2020-09-02 | Disposition: A | Discharge status: Home / Self Care | Payer: Medicare Other | Source: Ambulatory Visit | Attending: General Surgery | Admitting: General Surgery

## 2020-09-02 ENCOUNTER — Other Ambulatory Visit: Payer: Self-pay

## 2020-09-02 NOTE — Patient Instructions (Addendum)
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  You will be treated in the clinic every 3 weeks (for 6 cycles) with a combination of chemotherapy drugs, and an immunotherapy drug.  The intent of treatment is cure.  You will see the doctor regularly throughout treatment.  We will obtain blood work from you prior to every treatment and monitor your results to make sure it is safe to give your treatment. The doctor monitors your response to treatment by the way you are feeling, your blood work, and by obtaining scans periodically.  There will be wait times while you are here for treatment.  It will take about 30 minutes to 1 hour for your lab work to result.  Then there will be wait times while pharmacy mixes your medications.    R-CHOP is the acronym for the combination of chemotherapy drugs and other drugs you will receive for your treatment.  The drugs this regimen includes are:  Rituxan (rituximab); Cytoxan (cyclophosphamide); Oncovin (vincristine); Adriamycin (doxorubicin HCl); and prednisone (not a chemo drug).  Medications you will receive in the clinic prior to your chemotherapy medications:  Aloxi:  ALOXI is used in adults to help prevent nausea and vomiting that happens with certain chemotherapy drugs.  Aloxi is a long acting medication, and will remain in your system for about two days.   Emend:  This is an anti-nausea medication that is used with Aloxi to help prevent nausea and vomiting caused by chemotherapy.  Dexamethasone:  This is a steroid given prior to chemotherapy to help prevent allergic reactions; it may also help prevent and control nausea and diarrhea.   Tylenol:  Given to prevent infusion reactions to rituximab.  Benadryl:  Antihistamine given to prevent allergic/infusion reactions to rituximab.    Doxorubicin (Generic Name) Other Names: Adriamycin, hydroxyl  daunorubicin  About This Drug  Doxorubicin is a drug used to treat cancer. This drug is given in the vein (IV).  This is an IV push that will take about 5-10 minutes.  Possible Side Effects (More Common)   Bone marrow depression. This is a decrease in the number of white blood cells, red blood cells, and platelets. This may raise your risk of infection, make you tired and weak (fatigue), and raise your risk of bleeding.   Hair loss: Hair loss is often complete scalp hair loss and can involve loss of eyebrows, eyelashes, and pubic hair. You may notice this a few days or weeks after treatment has started. Most often hair loss is temporary; your hair should grow back when treatment is done.   Nausea and throwing up (vomiting). These symptoms may happen within a few hours after your treatment and may last up to 24 hours. Medicines are available to stop or lessen these side effects.   Soreness of the mouth and throat. You may have red areas, white patches, or sores that hurt.   Change in the color of your urine to pink or red. This color change will go away in one to two days.   Effects on the heart: This drug can weaken the heart and lower heart function. Your heart function will be checked as needed. You may have trouble catching your breath, mainly during activities.  You may also have trouble breathing while lying down, and have swelling in your ankles.   Sensitivity to light (photosensitivity). Photosensitivity means that you may become more sensitive to the   effects of the sun, sun lamps, and tanning beds. Your eyes may water more, mostly in bright light.   Metallic taste in the mouth: This may change the taste of food and drinks   Decreased appetite (decreased hunger)   Darkening of the skin or nails   Weakness that interferes with your daily activities   Possible Side Effects (Less Common)   Skin and tissue irritation may involve redness, pain, warmth, or swelling at the IV site.  This happens if the drug leaks out of the vein and into nearby tissue.   Changes in your liver function. Your doctor will check your liver function as needed.   This drug may cause an increased risk of developing a second cancer  Allergic Reaction  Serious allergic reactions, including anaphylaxis are rare. While you are getting this drug in your vein (IV), tell your nurse right away if you have any of these symptoms of an allergic reaction:   Trouble catching your breath   Feeling like your tongue or throat are swelling   Feeling your heart beat quickly or in a not normal way (palpitations)   Feeling dizzy or lightheaded   Flushing, itching, rash, and/or hives   Treating Side Effects   Drink 6-8 cups of fluids every day unless your doctor has told you to limit your fluid intake due to some other health problem. A cup is 8 ounces of fluid. If you vomit or have diarrhea, you should drink more fluids so that you do not become dehydrated (lack water in the body due to losing too much fluid).   Ask your doctor or nurse about medicine that is available to help stop or lessen nausea, throwing up, and/or loose bowel movements   Wear dark sunglasses and use sunscreen with SPF 30 or higher when you are outdoors even for a short time. Cover up when you are out in the sun. Wear wide-brimmed hats, long-sleeved shirts, and pants. Keep your neck, chest, and back covered.   Mouth care is very important. Your mouth care should consist of routine, gentle cleaning of your teeth or dentures and rinsing your mouth with a mixture of 1/2 teaspoon of salt in 8 ounces of water or  teaspoon of baking soda in 8 ounces of water. This should be done at least after each meal and at bedtime.   If you have mouth sores, avoid mouthwash that has alcohol. Avoid alcohol and smoking because they can bother your mouth and throat.   Talk with your nurse about getting a wig before you lose your hair. Also, call the  [employer] at 800-ACS-2345 to find out information about the "Look Good, Feel Better" program close to where you live. It is a free program where women getting chemotherapy can learn about wigs, turbans and scarves as well as makeup techniques and skin and nail care.   While you are getting this drug, please tell your nurse right away if you have any pain, redness, or swelling at the site of the IV infusion.   Food and Drug Interactions  There are no known interactions of doxorubicin with food. This drug may interact with other medicines. Tell your doctor and pharmacist about all the medicines and dietary supplements (vitamins, minerals, herbs and others) that you are taking at this time. The safety and use of dietary supplements and alternative diets are often not known. Using these might affect your cancer or interfere with your treatment. Until more is known, you should   not use dietary supplements or alternative diets without your cancer doctor's help.   When to Call the Doctor  Call your doctor or nurse right away if you have any of these symptoms:   Fever of 100.4 F (38 C) or above   Chills   Easy bruising or bleeding   Wheezing or trouble breathing   Rash or itching   Feeling dizzy or lightheaded   Feeling that your heart is beating in a fast or not normal way (palpitations)   Loose bowel movements (diarrhea) more than 4 times a day or diarrhea with weakness or feeling lightheaded   Nausea that stops you from eating or drinking   Throwing up/vomiting   Signs of liver problems: dark urine, pale bowel movements, bad stomach pain, feeling very tired and weak, unusual itching, or yellowing of the eyes or skin,   During the IV infusion, if you have pain, redness, or swelling at the site of the IV infusion, please tell your nurse right away   Call your doctor or nurse as soon as possible if any of these symptoms happen:   Decreased urine   Pain in your mouth  or throat that makes it hard to eat or drink   Nausea and throwing up that is not relieved by prescribed medicines   Rash that is not relieved by prescribed medicines   Swelling of legs, ankles, or feet   Weight gain of 5 pounds in one week (fluid retention)   Lasting loss of appetite or rapid weight loss of five pounds in a week   Fatigue that interferes with your daily activities   Extreme weakness that interferes with normal activities   Sexual Problems and Reproduction Concerns   Infertility warning: Sexual problems and reproduction concerns may happen. In both men and women, this drug may affect your ability to have children. This cannot be determined before your treatment. Talk with your doctor or nurse if you plan to have children. Ask for information on sperm or egg banking.   In men, this drug may interfere with your ability to make sperm, but it should not change your ability to have sexual relations.   In women, menstrual bleeding may become irregular or stop while you are getting this drug. Do not assume that you cannot become pregnant if you do not have a menstrual period.   Women may go through signs of menopause (change of life) like vaginal dryness or itching. Vaginal lubricants can be used to lessen vaginal dryness, itching, and pain during sexual relations.   Genetic counseling is available for you to talk about the effects of this drug therapy on future pregnancies. Also, a genetic counselor can look at the possible risk of problems in the unborn baby due to this medicine if an exposure happens during pregnancy.   Pregnancy warning: This drug may have harmful effects on the unborn baby, so effective methods of birth control should be used by you and your partner during your cancer treatment and for at least 6 months after treatment   Breast feeding warning: Women should not breast feed during treatment because this drug could enter the breast milk and badly harm a  breast feeding baby.   Vincristine sulfate (Generic Name) Other Names: Oncovin, Vincasar, VCR  About This Drug  Vincristine is a drug used to treat cancer. It is given in the vein (IV).  This drug will take 10 minutes to infuse.   Possible Side Effects (More Common)     Constipation (not able to move bowels)   Hair loss. You may notice hair getting thin. Some patients lose their hair. Your hair often grows back when treatment is done. Most often hair loss is temporary; your hair should grow back when treatment is done.   Effects on the nerves are called peripheral neuropathy. You may feel numbness, tingling, or pain in your hands and feet. It may be hard for you to button your clothes, open jars, or walk as usual. The effect on the nerves may get worse with more doses of the drug. These effects get better in some people after the drug is stopped but it does not get better in all people.   Possible Side Effects (Less Common)   Soreness of the mouth and throat. You may have red areas, white patches, or sores that hurt.   Swelling of your legs, ankles, and/or feet   Skin and tissue irritation may involve redness, pain, warmth, or swelling at the IV site. This happens if the drug leaks out of the vein and into nearby tissue.   Blood pressure changes. Some patients have low blood pressure and some patients have high blood pressure.   Jaw pain   Hoarseness   Blurred or double vision or other changes in eyesight may happen but are a rare side effect.   Feeling dizzy or lightheaded   Drooping eyelids are a rare side effect.   Depression   Rash   Bone marrow depression. This is a decrease in the number of white blood cells, red blood cells, and platelets. This may raise your risk of infection, make you tired and weak (fatigue), and raise your risk of bleeding.   Chest pain or symptoms of a heart attack. Most heart attacks involve pain in the center of the chest that lasts more  than a few minutes. The pain may go away and come back or it can be constant. It can feel like pressure, squeezing, fullness, or pain. Sometimes pain is felt in one or both arms, the back, neck, jaw, or stomach. If any of these symptoms last 2 minutes, call 911.   Trouble sleeping   Lack of appetite; weight loss   Allergic Reactions  Serious allergic reactions including anaphylaxis are rare. While you are getting this drug in your vein (IV), tell your nurse right away if you have any of these symptoms of an allergic reaction:   Trouble catching your breath   Feeling like your tongue or throat are swelling   Feeling your heart beat quickly or in a not normal way (palpitations)   Feeling dizzy or lightheaded   Flushing, itching, rash, and/or hives   Treating Side Effects   While you are getting this drug in your IV, please tell your nurse right away if you have any pain, redness, or swelling at the site of the IV infusion.   Ask your doctor or nurse how to prevent or lessen constipation. Constipation can become a serious side effect. If you are constipated, check with your doctor or nurse before you use enemas, laxatives, or suppositories.   Drink 6-8 cups of fluids each day unless your doctor has told you to limit your fluid intake due to some other health problem. A cup is 8 ounces of fluid. If you throw up or have loose bowel movements, you should drink more fluids so that you do not become dehydrated (lack water in the body from losing too much fluid).   Mouth care is   very important. Your mouth care should consist of gently cleaning your teeth or dentures and rinsing your mouth with a mixture of  teaspoon salt in 8 ounces of water or  teaspoon sodium bicarbonate (baking soda) in 8 ounces of water. This should be done at least after each meal and at bedtime.   If you have mouth sores, avoid mouthwash that has alcohol. Also avoid alcohol and smoking because they can bother your  mouth and throat.   If you get a rash do not put anything on it unless your doctor or nurse says you may. Keep the area around the rash clean and dry. Ask your doctor for medicine if your rash bothers you.   If you have numbness and tingling in your hands and feet, be careful when cooking, walking, and handling sharp objects and hot liquids.   Talk with your nurse about getting a wig before you lose your hair. Also, call the [employer] at 800-ACS-2345 to find out information about the "Look Good, Feel Better" program close to where you live. It is a free program where women getting chemotherapy can learn about wigs, turbans and scarves as well as makeup techniques and skin and nail care.   Food and Drug Interactions   There are known interactions of Vincristine with grapefruit. Do not eat grapefruit or drink grapefruit juice while taking this drug.   Check with your doctor or pharmacist about all other prescription medicines and dietary supplements you are taking before starting this medicine as there are a lot of known drug interactions with Vincristine. Also, check with your doctor or pharmacist before starting any new prescription, over-the-counter medicines, or dietary supplement to make sure that there are no interactions.   When to Call the Doctor  Call your doctor or nurse right away if you have any of these symptoms:   Redness, pain, warmth, or swelling at the IV site while you are getting this drug   No bowel movement in 3 days or when you feel uncomfortable.   Abdominal pain   Fever of 100.4 F (38 C) or higher   Chills   Feeling short of breath   Easy bleeding or bruising   Jaw pain   Hoarseness   Drooping eyelids   Blurred vision or other changes in eyesight   Feeling confused, restless, or irritable   Seizures (Common symptoms of a seizure can include confusion, blacking out, passing out, loss of hearing or vision, blurred vision, unusual smells or  tastes (such as burning rubber), trouble talking, tremors or shaking in parts or all of the body, repeated body movements, tense muscles that do not relax, and loss of control of urine and bowels. There are other less common symptoms of seizures. If you or your family member suspects you are having a seizure, call 911 right away).   Hallucinations (seeing, hearing, or feeling things that are not really there)   Feeling   Rash   Chest pain or symptoms of a heart attack. Most heart attacks involve pain in the center of the chest that lasts more than a few minutes. The pain may go away and come back. It can feel like pressure, squeezing, fullness, or pain. Sometimes pain is felt in one or both arms, the back, neck, jaw, or stomach. If any of these symptoms last 2 minutes, call 911.  Call your doctor or nurse as soon as possible if you have any of these symptoms:   Pain in fingers,   toes, joints, or testicles   Numbness, tingling, or decreased feeling or weakness in fingers, toes, arms, or legs   Trouble walking or changes in the way you walk   Swelling of your legs, ankles, and/or feet   Headache   Pain in your mouth or throat that makes it hard to eat or drink   Loss of appetite or weight loss   Hearing changes   Depression   Trouble sleeping   Reproduction Concerns   Women may go through signs of menopause (change of life) like vaginal dryness or itching. Vaginal lubricants can be used to lessen vaginal dryness, itching, and pain during sexual relations.   Pregnancy warning: This drug may have harmful effects on the unborn child, so effective methods of birth control should be used during your cancer treatment.   Breast feeding warning: It is not known if this drug passes into breast milk. For this reason, women should talk to their doctor about the risks and benefits of breast feeding during treatment with this drug because this drug may enter the breast milk and badly harm a  breast feeding baby.   Cyclophosphamide (Generic Name) Other Names: Cytoxan, Neosar  About This Drug  Cyclophosphamide is a drug used to treat cancer. It is given in the vein (IV) or by mouth. This drug takes 30 minutes to infuse.  Possible Side Effects (More Common)   Nausea and throwing up (vomiting). These symptoms may happen within a few hours after your treatment and may last up to 72 hours. Medicines are available to stop or lessen these side effects.   Bone marrow depression. This is a decrease in the number of white blood cells, red blood cells, and platelets. This may raise your risk of infection, make you tired and weak (fatigue), and raise your risk of bleeding.   Hair loss: You may notice hair getting thin. Some patients lose their hair. Hair loss is often complete scalp hair loss and can involve loss of eyebrows, eyelashes, and pubic hair. You may notice this a few days or weeks after treatment has started. Most often hair loss is temporary; your hair should grow back when treatment is done.   Decreased appetite (decreased hunger)   Blurred vision   Soreness of the mouth and throat. You may have red areas, white patches, or sores that hurt.   Effects on the bladder. This drug may cause irritation and bleeding in the bladder. You may have blood in your urine. To help stop this, you will get extra fluids to help you pass more urine. You may get a drug called mesna, which helps to decrease irritation and bleeding. You may also get a medicine to help you pass more urine. You may have a catheter (tube) placed in your bladder so that your bladder will be washed with this drug.  Possible Side Effects (Less Common)   Darkening of the skin or nails   Metallic taste in the mouth   Changes in lung tissue may happen with large amounts of this drug. These changes may not last forever, and your lung tissue may go back to normal. Sometimes these changes may not be seen for many years.  You may get a cough or have trouble catching your breath.   Allergic Reactions  Serious allergic reactions including anaphylaxis are rare. While you are getting this drug in your vein (IV), tell your nurse right away if you have any of these symptoms of an allergic reaction:     Trouble catching your breath   Feeling like your tongue or throat are swelling   Feeling your heart beat quickly or in a not normal way (palpitations)   Feeling dizzy or lightheaded   Flushing, itching, rash, and/or hives   Treating Side Effects   Drink 6-8 cups of fluids each day unless your doctor has told you to limit your fluid intake due to some other health problem. A cup is 8 ounces of fluid. If you throw up or have loose bowel movements you should drink more fluids so that you do not become dehydrated (lack water in the body due to losing too much fluid).   Ask your doctor or nurse about medicine that is available to help stop or lessen nausea or throwing up.   Mouth care is very important. Your mouth care should consist of routine, gentle cleaning of your teeth or dentures and rinsing your mouth with a mixture of 1/2 teaspoon of salt in 8 ounces of water or  teaspoon of baking soda in 8 ounces of water. This should be done at least after each meal and at bedtime.   If you have mouth sores, avoid mouthwash that has alcohol. Also avoid alcohol and smoking because they can bother your mouth and throat.   Talk with your nurse about getting a wig before you lose your hair. Also, call the [employer] at 800-ACS-2345 to find out information about the " Look Good...Feel Better" program close to where you live. It is a free program where women undergoing chemotherapy learn about wigs, turbans and scarves as well as makeup techniques and skin and nail care.  Important Information   Whenever you tell a doctor or nurse your health history, always tell them that you have received cyclophosphamide in  the past.   If you take this drug by mouth swallow the medicine whole. Do not chew, break or crush it.   You can take the medicine with or without food. If you have nausea, take it with food. Do not take the pills at bedtime.  Food and Drug Interactions  There are no known interactions of cyclophosphamide with food. This drug may interact with other medicines. Tell your doctor and pharmacist about all the medicines and dietary supplements (vitamins, minerals, herbs and others) that you are taking at this time. The safety and use of dietary supplements and alternative diets are often not known. Using these might affect your cancer or interfere with your treatment. Until more is known, you should not use dietary supplements or alternative diets without your cancer doctor's help.   When to Call the Doctor  Call your doctor or nurse right away if you have any of these symptoms:   Fever of 100.4 F (38 C) or higher   Chills   Bleeding or bruising that is not normal   Blurred vision or other changes in eyesight   Pain when passing urine; blood in urine   Pain in your lower back or side   Wheezing or trouble breathing   Swelling of legs, ankles, or feet   Feeling dizzy or lightheaded   Feeling confused or agitated   Signs of liver problems: dark urine, pale bowel movements, bad stomach pain, feeling very tired and weak, unusual itching, or yellowing of the eyes or skin   Unusual thirst or passing urine often   Nausea that stops you from eating or drinking   Throwing up/vomiting  Call your doctor or nurse as soon as   possible if any of these symptoms happen:   Pain in your mouth or throat that makes it hard to eat or drink   Nausea not relieved by prescribed medicines   Sexual Problems and Reproductive Concerns   Infertility warning: Sexual problems and reproduction concerns may happen. In both men and women, this drug may affect your ability to have children. This cannot be  determined before your treatment. Talk with your doctor or nurse if you plan to have children. Ask for information on sperm or egg banking.   In men, this drug may interfere with your ability to make sperm, but it should not change your ability to have sexual relations.   In women, menstrual bleeding may become irregular or stop while you are getting this drug. Do not assume that you cannot become pregnant if you do not have a menstrual period.   Women may go through signs of menopause (change of life) like vaginal dryness or itching. Vaginal lubricants can be used to lessen vaginal dryness, itching, and pain during sexual relations.   Genetic counseling is available for you to talk about the effects of this drug therapy on future pregnancies. Also, a genetic counselor can look at the possible risk of problems in the unborn baby due to this medicine if an exposure happens during pregnancy.   Pregnancy warning: This drug may have harmful effects on the unborn child, so effective methods of birth control should be used during your cancer treatment.   Breast feeding warning: Women should not breast feed during treatment because this drug could enter the breast milk and badly harm a breast feeding baby.   Rituximab (Generic Name) Other Name: Rituxan  About This Drug  Rituximab is a monoclonal antibody used to treat cancer. This drug is given in the vein (IV).  This first time this is given it will be infused slower to monitor for infusion reactions.    Possible Side Effects (More Common)   Bone marrow depression. This is a decrease in the number of white blood cells, red blood cells, and platelets. This  may raise your risk of infection, make you tired and weak (fatigue), and raise your risk of bleeding.   Rash-skin irritation, redness or itching (dermatitis)   Flu-like symptoms: fever, headache, muscle and joint aches, and fatigue (low energy, feeling weak)   Infusion-related  reactions   Hepatitis B - if you have ever had hepatitis B, the virus may come back during treatment with this drug. Your doctor will test to see if you have ever had hepatitis B prior to your treatment.   Changes in your central nervous system can happen. The central nervous system is made up of your brain and spinal cord. You could feel: extreme tiredness, agitation, confusion, or have: hallucinations (see or hear things that are not there), trouble understanding or speaking, loss of control of your bowels or bladder, eyesight changes, numbness or lack of strength to your arms, legs, face, or body, seizures or coma. If you start to have any of these symptoms let your doctor know right away.   Tumor lysis: This drug may act on the cancer cells very quickly. This may affect how your kidneys work. Your doctor will monitor your kidney function.   Changes in your liver function. Your doctor will check your liver function as needed.   Nausea and throwing up (vomiting): these symptoms may happen within a few hours after your treatment and may last up to 24 hours. Medicines   are available to stop or lessen these side effects.   Loose bowel movements (diarrhea) that may last for a few days   Abdominal pain   Infections   Cough, runny nose   Swelling of your legs, ankles and/or feet or hands   High blood pressure. Your doctor will check your blood pressure as needed.   Abnormal heart beat  Possible Side Effects (Less Common)   Shortness of breath   Soreness of the mouth and throat. You may have red areas, white patches, or sores that hurt.  Infusion Reactions  Infusion Reactions are the most common side effect linked to use of this drug and can be quite severe. Medicines will be given before you get the drug to lower the severity of this side effect. The infusion reactions are the worse with the first dose of the drug and become less severe with more doses of the drug. While you are getting  this drug in your vein (IV), tell your nurse right away if you have any of these symptoms of an allergic reaction:   Trouble catching your breath   Feeling like your tongue or throat are swelling   Feeling your heart beat quickly or in a not normal way (palpitations)   Feeling dizzy or lightheaded   Flushing, itching, rash, and/or hives   Treating Side Effects   Ask your doctor or nurse about medicine to stop or lessen headache, loose bowel movements (diarrhea), constipation, nausea, throwing up (vomiting), or pain.   If you get a rash do not put anything it unless your doctor or nurse says you may. Keep the area around the rash clean and dry. Ask your doctor for medicine if the rash bothers you.   Drink 6-8 cups of fluids each day unless your doctor has told you to limit your fluid intake due to some other health problem. A cup is 8 ounces of fluid. If you throw up or have loose bowel movements, you should drink more fluids so that you do not become dehydrated (lack of water in the body from losing too much fluid).   If you are not able to move your bowels, check with your doctor or nurse before you use enemas, laxatives, or suppositories   If you have mouth sores, avoid mouthwash that has alcohol. Also avoid alcohol and smoking because they can bother your mouth and throat.   If you have a nose bleed, sit with your head tipped slightly forward. Apply pressure by lightly pinching the bridge of your nose between your thumb and forefinger. Call your doctor if you feel dizzy or faint or if the bleeding doesn't stop after 10 to 15 minutes   Important Information   After treatment with this drug, vaccination with live viruses should be delayed until the immune system recovers.   Symptoms of abnormal bleeding may be: coughing up blood, throwing up blood (may look like coffee grounds), red or black, tarry bowel movements, blood in urine, abnormally heavy menstrual flow, nosebleeds, or any  unusual bleeding.   Symptoms of high blood pressure may be: headache, blurred vision, confusion, chest pain, or a feeling that your heart is beat differently.   Urinary tract infection. Symptoms may include:   Pain or burning when you pass urine   Feeling like you have to pass urine often, but not much comes out when you do.   Tender or heavy feeling in your lower abdomen   Cloudy urine and/or urine that smells bad.     Pain on one side of your back under your ribs. This is where your kidneys are.   Fever, chills, nausea and/or throwing up   Food and Drug Interactions  There are no known interactions of rituximab and any food. This drug may interact with other medicines. Tell your doctor and pharmacist about all the medicines and dietary supplements (vitamins, minerals, herbs and others) that you are taking at this time. The safety and use of dietary supplements and alternative diets are often not known. Using these might affect your cancer or interfere with your treatment. Until more is known, you should not use dietary supplements or alternative diets without your cancer doctor's help.   When to Call the Doctor  Call your doctor or nurse right away if you have any of these symptoms:   Fever of 100.4 F (38 C) higher   Chills   Trouble breathing   Rash with or without itching   Blistering or peeling of skin   Chest pain or symptoms of a heart attack. Most heart attacks involve pain in the center of the chest that lasts more than a few minutes. The pain may go away and come back or it can be constant. It can feel like pressure, squeezing, fullness, or pain. Sometimes pain is felt in one or both arms, the back, neck, jaw, or stomach. If any of these symptoms last 2 minutes, call 911   Easy bleeding or bruising   Blood in urine or bowel movements   Feeling that your heart is beating in a fast or not normal way (palpitations)   Nausea that stops you from eating or drinking    Throwing up/vomiting   Abdominal pain   Loose bowel movements (diarrhea) 4 times in one day or diarrhea with weakness or lightheadedness   No bowel movement in 3 days or if you feel uncomfortable   Feeling dizzy or lightheaded   Changes in your speech or vision   Feeling confused   Weakness of your arms and legs or poor coordination (feeling clumsy)   Signs of liver problems: dark urine, pale bowel movements, bad stomach pain, feeling very tired and weak, unusual itching, or yellowing of skin or eyes   Symptoms of a urinary tract infection (see important information)   Call your doctor or nurse as soon as possible if you have any of these symptoms:   Swelling of your legs, ankles and/or feet   Fatigue and /or weakness that interferes with your daily activities   Joint and muscle pain or muscle spasms that are not relieved by prescribed medicines   Cough that lasts longer than normal   Reproduction Concerns   Pregnancy warning: This drug is known to cross the placenta. This drug may have harmful effects on an unborn baby. Effective methods of birth control should be used during treatment with this drug and for 12 months after the last treatment. If exposure occurs to an unborn baby, the baby's immune system may be affected, which could last for months after birth. Until the immune system recovers, live vaccines should not be administered to the baby. Be sure to talk with your doctor if you are pregnant or planning to become pregnant while getting this drug.   Breast feeding warning: It is not known if rituximab is passed into human breast milk. In animal studies, this drug was detected in in breast milk. For this reason, women should talk to their doctor about the risks and benefits of breast feeding   during treatment with this drug because this drug may enter the breast milk and badly harm a breast feeding baby.   Prednisone  About This Drug  Prednisone is a steroid that may  be used to treat cancer. It is given orally (by mouth).  You will take this medication (100 mg daily with food) on Days 1-5.  Day 1 is each day you come to the clinic to get chemotherapy treatment.  You will start the prednisone on that day, and continue to take daily for the four days after, to total 5 days.  You will do this with each cycle of chemotherapy.  You do not take this medication on the weeks you are not getting treatment.   Possible Side Effects   Abnormal heart beat   Headache   High blood pressure   Increased sweating   Blurred vision or other changes in eyesight   Tiredness and weakness   Changes in mood, which may include depression or a feeling of extreme well-being   Trouble sleeping   Feeling restless (unable to relax)   Skin changes such as rash, dryness, or redness   Increased appetite (increased hunger)   Weight gain   Electrolyte changes   Increased risk of infections   Aggravation of stomach ulcers   Pain in your abdomen   Nausea   Blood sugar levels may change   Swelling of your legs, ankles and/or feet   Muscle loss and/or weakness (lack of muscle strength)   Increased risk of developing osteoporosis- your bones may become weak and brittle   Increased risk for cataracts, glaucoma or infections of the eye   Changes in your liver function  Note: Not all possible side effects are included above.  Warnings and Precautions   High blood pressure and changes in electrolytes, which can cause fluid build-up around your heart, lungs or elsewhere.   Severe infections, which can be life-threatening.   Allergic reactions, including anaphylaxis are rare but may happen in some patients. Signs of allergic reaction to this drug may be swelling of the face, feeling like your tongue or throat are swelling, trouble breathing, rash, itching, fever, chills, feeling dizzy, and/or feeling that your heart is beating in a fast or not normal way. If this  happens, do not take another dose of this drug. You should get urgent medical treatment.   Increased risk of developing a hole in your stomach, small, and/or large intestine if you have ulcers in the lining of your stomach and/or intestine, or have diverticulitis, ulcerative colitis and/or other diseases that affect the gastrointestinal tract.   Blurred vision or other changes in eyesight.   Severe depression and other psychiatric disorders such as mood changes.   Effects on the endocrine glands including pituitary, adrenals and thyroid during or after use of this medication.  Note: Some of the side effects above are very rare. If you have concerns and/or questions, please discuss them with your medical team.  Important Information   Talk to your doctor or your nurse before stopping this medication, it should be stopped gradually. Depending on the dose and length of treatment, you could experience serious side effects if stopped abruptly (suddenly).   Talk to your doctor before receiving any vaccinations during your treatment. Some vaccinations are not recommended while receiving prednisone.  How to Take Your Medication   For Oral (by mouth): You can take the medicine with or without food, but it is preferable to be taken with food,   especially If you have nausea or upset stomach.   Missed dose: If you miss or vomit a dose, contact your physician. Do not take 2 doses at the same time and do not double up on the next dose.   Handling: Wash your hands after handling your medicine, your caretakers should not handle your medicine with bare hands and should wear latex gloves.   Storage: Store this medicine in the original container at room temperature, protect from moisture. Discuss with your nurse or your doctor how to dispose of unused medicine.   Treating Side Effects   Drink plenty of fluids (a minimum of eight glasses per day is recommended).   To help with nausea and vomiting, eat  small, frequent meals instead of three large meals a day. Choose foods and drinks that are at room temperature. Ask your nurse or doctor about other helpful tips and medicine that is available to help stop or lessen these symptoms.   If you throw up, you should drink more fluids so that you do not become dehydrated (lack of water in the body from losing too much fluid).   Manage tiredness by pacing your activities for the day. Be sure to include periods of rest between energy-draining activities.   To help with muscle weakness, get regular exercise. If you feel too tired to exercise vigorously, try taking a short walk.   If you are having trouble sleeping, talk to your nurse or doctor on tips to help you sleep better.   If you are feeling depressed, talk to your nurse or doctor about it.   Keeping your pain under control is important to your well-being. Please tell your doctor or nurse if you are experiencing pain.   If you have diabetes, keep good control of your blood sugar level. Tell your nurse or your doctor if your glucose levels are higher or lower than normal.   To decrease the risk of infection, wash your hands regularly.   Avoid close contact with people who have a cold, the flu, or other infections.   Take your temperature as your doctor or nurse tells you, and whenever you feel like you may have a fever.   If you get a rash do not put anything on it unless your doctor or nurse says you may. Keep the area around the rash clean and dry. Ask your doctor for medicine if your rash bothers you.   Moisturize your skin several times a day   Avoid sun exposure and apply sunscreen routinely when outdoors.   Food and Drug Interactions   This drug may interact with grapefruit and grapefruit juice. Talk to your doctor as this could make side effects worse.   Check with your doctor or pharmacist about all other prescription medicines and over-the-counter medicines and dietary  supplements (vitamins, minerals, herbs and others) you are taking before starting this medicine as there are known drug interactions with prednisone. Also, check with your doctor or pharmacist before starting any new prescription or over-the-counter medicines, or dietary supplements to make sure that there are no interactions.   There are known interactions of prednisone with other medicines and products like aspirin, and other nonsteroidal anti-inflammatory agents. Ask your doctor what over-the-counter (OTC) are safe to take while taking this medication.    There are known interactions of prednisone with blood thinning medicine such as warfarin. Ask your doctor what precautions you should take.   Avoid the use of St. John's Wort while taking prednisone   as this may lower the levels of the drug in your body, which can make it less effective.   When to Call the Doctor  Call your doctor or nurse if you have any of these symptoms and/or any new or unusual symptoms:   Fever of 100.4 F (38 C) or higher   Chills   A headache that does not go away   Blurry vision or other changes in eyesight   Feel irritable, nervous or restless   Trouble sleeping   Tiredness or weakness that interferes with your daily activities   Trouble breathing   Feeling that your heart is beating in a fast or not normal way (palpitations)   Chest pain or symptoms of a heart attack. Most heart attacks involve pain in the center of the chest that lasts more than a few minutes. The pain may go away and come back or it can be constant. It can feel like pressure, squeezing, fullness, or pain. Sometimes pain is felt in one or both arms, the back, neck, jaw, or stomach. If any of these symptoms last 2 minutes, call 911.   Nausea that stops you from eating or drinking and/or is not relieved by prescribed medicines   Throwing up   Severe abdominal pain that does not go away   Heartburn or indigestion   Abnormal blood  sugar   Severe mood changes such as depression or unusual thoughts and/or behaviors   Thoughts of hurting yourself or others, and suicide   Feeling hopeless on most days   Unusual thirst, passing urine often, headache, sweating, shakiness, irritability   Swelling of legs, ankles, or feet   Weight gain of 5 pounds in one week (fluid retention)   A new rash or a rash that is not relieved by prescribed medicines   Signs of possible liver problems: dark urine, pale bowel movements, bad stomach pain, feeling very tired and weak, unusual itching, or yellowing of the eyes or skin   Signs of allergic reaction: swelling of the face, feeling like your tongue or throat are swelling, trouble breathing, rash, itching, fever, chills, feeling dizzy, and/or feeling that your heart is beating in a fast or not normal way. If this happens, call 911 for emergency care.   If you think you may be pregnant  Reproduction Warnings   Pregnancy warning: It is not known if this drug may harm an unborn child. For this reason, be sure to talk with your doctor if you are pregnant or planning to become pregnant while receiving this drug. Let your doctor know right away if you think you may be pregnant.   Breastfeeding warning: It is not known if this drug passes into breast milk. For this reason, women should talk to their doctor about the risks and benefits of breastfeeding during treatment with this drug because this drug may enter the breast milk and cause harm to a breastfeeding baby.   Fertility warning: Human fertility studies have not been done with this drug. Talk with your doctor or nurse if you plan to have children. Ask for information on sperm or egg banking.  You will get Neulasta (or similar) after every treatment of chemo:  Neulasta (or similar) - this medication is not chemotherapy but is being given because you have had chemo. It is usually given 24-48 hours after the completion of chemotherapy.  This medication works by boosting your bone marrow's supply of white blood cells. White blood cells are what protect our bodies   against infection. The medication is given in the form of a subcutaneous injection. It is given in the fatty tissue of your abdomen, or in the skin on the back of your arm . It is a short needle. The major side effect of this medication is bone or muscle pain. The drug of choice to relieve or lessen the pain is Aleve or Ibuprofen. If a physician has ever told you not to take Aleve or Ibuprofen - then don't take it. You should then take Tylenol/acetaminophen. Take either medication as the bottle directs you to.  The level of pain you experience as a result of this injection can range from none, to mild or moderate, or severe. Please let us know if you develop moderate or severe bone pain.   You can take Claritin 10 mg over the counter for a few days after receiving Neulasta to help with the bone aches and pains.      SELF CARE ACTIVITIES WHILE RECEIVING CHEMOTHERAPY:  Hydration Increase your fluid intake 48 hours prior to treatment and drink at least 8 to 12 cups (64 ounces) of water/decaffeinated beverages per day after treatment. You can still have your cup of coffee or soda but these beverages do not count as part of your 8 to 12 cups that you need to drink daily. No alcohol intake.  Medications Continue taking your normal prescription medication as prescribed.  If you start any new herbal or new supplements please let us know first to make sure it is safe.  Mouth Care Have teeth cleaned professionally before starting treatment. Keep dentures and partial plates clean. Use soft toothbrush and do not use mouthwashes that contain alcohol. Biotene is a good mouthwash that is available at most pharmacies or may be ordered by calling [phone]. Use warm salt water gargles (1 teaspoon salt per 1 quart warm water) before and after meals and at bedtime. If you need dental work,  please let the doctor know before you go for your appointment so that we can coordinate the best possible time for you in regards to your chemo regimen. You need to also let your dentist know that you are actively taking chemo. We may need to do labs prior to your dental appointment.  Skin Care Always use sunscreen that has not expired and with SPF (Sun Protection Factor) of 50 or higher. Wear hats to protect your head from the sun. Remember to use sunscreen on your hands, ears, face, & feet.  Use good moisturizing lotions such as udder cream, eucerin, or even Vaseline. Some chemotherapies can cause dry skin, color changes in your skin and nails.    Avoid long, hot showers or baths. Use gentle, fragrance-free soaps and laundry detergent. Use moisturizers, preferably creams or ointments rather than lotions because the thicker consistency is better at preventing skin dehydration. Apply the cream or ointment within 15 minutes of showering. Reapply moisturizer at night, and moisturize your hands every time after you wash them.  Hair Loss (if your doctor says your hair will fall out)  If your doctor says that your hair is likely to fall out, decide before you begin chemo whether you want to wear a wig. You may want to shop before treatment to match your hair color. Hats, turbans, and scarves can also camouflage hair loss, although some people prefer to leave their heads uncovered. If you go bare-headed outdoors, be sure to use sunscreen on your scalp. Cut your hair short. It eases the inconvenience of   shedding lots of hair, but it also can reduce the emotional impact of watching your hair fall out. Don't perm or color your hair during chemotherapy. Those chemical treatments are already damaging to hair and can enhance hair loss. Once your chemo treatments are done and your hair has grown back, it's OK to resume dyeing or perming hair.  With chemotherapy, hair loss is almost always temporary. But when it  grows back, it may be a different color or texture. In older adults who still had hair color before chemotherapy, the new growth may be completely gray.  Often, new hair is very fine and soft.  Infection Prevention Please wash your hands for at least 30 seconds using warm soapy water. Handwashing is the #1 way to prevent the spread of germs. Stay away from sick people or people who are getting over a cold. If you develop respiratory systems such as green/yellow mucus production or productive cough or persistent cough let us know and we will see if you need an antibiotic. It is a good idea to keep a pair of gloves on when going into grocery stores/[employer] to decrease your risk of coming into contact with germs on the carts, etc. Carry alcohol hand gel with you at all times and use it frequently if out in public. If your temperature reaches 100.4 or higher please call the clinic and let us know.  If it is after hours or on the weekend please go to the ER if your temperature is over 100.4.  Please have your own personal thermometer at home to use.    Sex and bodily fluids If you are going to have sex, a condom must be used to protect the person that isn't taking chemotherapy. Chemo can decrease your libido (sex drive). For a few days after chemotherapy, chemotherapy can be excreted through your bodily fluids.  When using the toilet please close the lid and flush the toilet twice.  Do this for a few day after you have had chemotherapy.   Effects of chemotherapy on your sex life Some changes are simple and won't last long. They won't affect your sex life permanently.  Sometimes you may feel: too tired not strong enough to be very active sick or sore  not in the mood anxious or low  Your anxiety might not seem related to sex. For example, you may be worried about the cancer and how your treatment is going. Or you may be worried about money, or about how you family are coping with your illness. These  things can cause stress, which can affect your interest in sex. It's important to talk to your partner about how you feel. Remember - the changes to your sex life don't usually last long. There's usually no medical reason to stop having sex during chemo. The drugs won't have any long term physical effects on your performance or enjoyment of sex. Cancer can't be passed on to your partner during sex  Contraception It's important to use reliable contraception during treatment. Avoid getting pregnant while you or your partner are having chemotherapy. This is because the drugs may harm the baby. Sometimes chemotherapy drugs can leave a man or woman infertile.  This means you would not be able to have children in the future. You might want to talk to someone about permanent infertility. It can be very difficult to learn that you may no longer be able to have children. Some people find counselling helpful. There might be ways to preserve   your fertility, although this is easier for men than for women. You may want to speak to a fertility expert. You can talk about sperm banking or harvesting your eggs. You can also ask about other fertility options, such as donor eggs. If you have or have had breast cancer, your doctor might advise you not to take the contraceptive pill. This is because the hormones in it might affect the cancer.  It is not known for sure whether or not chemotherapy drugs can be passed on through semen or secretions from the vagina. Because of this some doctors advise people to use a barrier method if you have sex during treatment. This applies to vaginal, anal or oral sex. Generally, doctors advise a barrier method only for the time you are actually having the treatment and for about a week after your treatment. Advice like this can be worrying, but this does not mean that you have to avoid being intimate with your partner. You can still have close contact with your partner and continue to enjoy  sex.  Animals If you have cats or birds we just ask that you not change the litter or change the cage.  Please have someone else do this for you while you are on chemotherapy.   Food Safety During and After Cancer Treatment Food safety is important for people both during and after cancer treatment. Cancer and cancer treatments, such as chemotherapy, radiation therapy, and stem cell/bone marrow transplantation, often weaken the immune system. This makes it harder for your body to protect itself from foodborne illness, also called food poisoning. Foodborne illness is caused by eating food that contains harmful bacteria, parasites, or viruses.  Foods to avoid Some foods have a higher risk of becoming tainted with bacteria. These include: Unwashed fresh fruit and vegetables, especially leafy vegetables that can hide dirt and other contaminants Raw sprouts, such as alfalfa sprouts Raw or undercooked beef, especially ground beef, or other raw or undercooked meat and poultry Fatty, fried, or spicy foods immediately before or after treatment.  These can sit heavy on your stomach and make you feel nauseous. Raw or undercooked shellfish, such as oysters. Sushi and sashimi, which often contain raw fish.  Unpasteurized beverages, such as unpasteurized fruit juices, raw milk, raw yogurt, or cider Undercooked eggs, such as soft boiled, over easy, and poached; raw, unpasteurized eggs; or foods made with raw egg, such as homemade raw cookie dough and homemade mayonnaise  Simple steps for food safety  Shop smart. Do not buy food stored or displayed in an unclean area. Do not buy bruised or damaged fruits or vegetables. Do not buy cans that have cracks, dents, or bulges. Pick up foods that can spoil at the end of your shopping trip and store them in a cooler on the way home.  Prepare and clean up foods carefully. Rinse all fresh fruits and vegetables under running water, and dry them with a clean towel or  paper towel. Clean the top of cans before opening them. After preparing food, wash your hands for 20 seconds with hot water and soap. Pay special attention to areas between fingers and under nails. Clean your utensils and dishes with hot water and soap. Disinfect your kitchen and cutting boards using 1 teaspoon of liquid, unscented bleach mixed into 1 quart of water.    Dispose of old food. Eat canned and packaged food before its expiration date (the "use by" or "best before" date). Consume refrigerated leftovers within 3 to 4   days. After that time, throw out the food. Even if the food does not smell or look spoiled, it still may be unsafe. Some bacteria, such as Listeria, can grow even on foods stored in the refrigerator if they are kept for too long.  Take precautions when eating out. At restaurants, avoid buffets and salad bars where food sits out for a long time and comes in contact with many people. Food can become contaminated when someone with a virus, often a norovirus, or another "bug" handles it. Put any leftover food in a "to-go" container yourself, rather than having the server do it. And, refrigerate leftovers as soon as you get home. Choose restaurants that are clean and that are willing to prepare your food as you order it cooked.   AT HOME MEDICATIONS:                                                                                                                                                                Compazine/Prochlorperazine 10mg tablet. Take 1 tablet every 6 hours as needed for nausea/vomiting. (This can make you sleepy)   EMLA cream. Apply a quarter size amount to port site 1 hour prior to chemo. Do not rub in. Cover with plastic wrap.    Diarrhea Sheet   If you are having loose stools/diarrhea, please purchase Imodium and begin taking as outlined:  At the first sign of poorly formed or loose stools you should begin taking Imodium (loperamide) 2 mg capsules.   Take two tablets (4mg) followed by one tablet (2mg) every 2 hours - DO NOT EXCEED 8 tablets in 24 hours.  If it is bedtime and you are having loose stools, take 2 tablets at bedtime, then 2 tablets every 4 hours until morning.   Always call the Cancer Center if you are having loose stools/diarrhea that you can't get under control.  Loose stools/diarrhea leads to dehydration (loss of water) in your body.  We have other options of trying to get the loose stools/diarrhea to stop but you must let us know!  Constipation Sheet  Colace - 100 mg capsules - take 2 capsules daily.  If this doesn't help then you can increase to 2 capsules twice daily.  Please call if the above does not work for you. Do not go more than 2 days without a bowel movement.  It is very important that you do not become constipated.  It will make you feel sick to your stomach (nausea) and can cause abdominal pain and vomiting.  Nausea Sheet   Compazine/Prochlorperazine 10mg tablet. Take 1 tablet every 6 hours as needed for nausea/vomiting (This can make you drowsy).  If you are having persistent nausea (nausea that does not stop) please call the Cancer Center and let us   know the amount of nausea that you are experiencing.  If you begin to vomit, you need to call the Cancer Center and if it is the weekend and you have vomited more than one time and can't get it to stop-go to the Emergency Room.  Persistent nausea/vomiting can lead to dehydration (loss of fluid in your body) and will make you feel very weak and unwell. Ice chips, sips of clear liquids, foods that are at room temperature, crackers, and toast tend to be better tolerated.    SYMPTOMS TO REPORT AS SOON AS POSSIBLE AFTER TREATMENT:  FEVER GREATER THAN 100.4 F  CHILLS WITH OR WITHOUT FEVER  NAUSEA AND VOMITING THAT IS NOT CONTROLLED WITH YOUR NAUSEA MEDICATION  UNUSUAL SHORTNESS OF BREATH  UNUSUAL BRUISING OR BLEEDING  TENDERNESS IN MOUTH AND THROAT WITH OR WITHOUT    PRESENCE OF ULCERS  URINARY PROBLEMS  BOWEL PROBLEMS  UNUSUAL RASH    Wear comfortable clothing and clothing appropriate for easy access to any Portacath or PICC line. Let us know if there is anything that we can do to make your therapy better!   What to do if you need assistance after hours or on the weekends: CALL [phone].  HOLD on the line, do not hang up.  You will hear multiple messages but at the end you will be connected with a nurse triage line.  They will contact the doctor if necessary.  Most of the time they will be able to assist you.  Do not call the hospital operator.     I have been informed and understand all of the instructions given to me and have received a copy. I have been instructed to call the clinic [phone] or my family physician as soon as possible for continued medical care, if indicated. I do not have any more questions at this time but understand that I may call the Cancer Center or the Patient Navigator at [phone] during office hours should I have questions or need assistance in obtaining follow-up care.         

## 2020-09-03 ENCOUNTER — Encounter (HOSPITAL_COMMUNITY): Payer: Self-pay

## 2020-09-03 ENCOUNTER — Other Ambulatory Visit (HOSPITAL_COMMUNITY): Payer: Self-pay

## 2020-09-03 ENCOUNTER — Other Ambulatory Visit: Payer: Self-pay

## 2020-09-03 ENCOUNTER — Inpatient Hospital Stay (HOSPITAL_COMMUNITY): Payer: Medicare Other

## 2020-09-03 DIAGNOSIS — Z95828 Presence of other vascular implants and grafts: Secondary | ICD-10-CM

## 2020-09-03 DIAGNOSIS — C8330 Diffuse large B-cell lymphoma, unspecified site: Secondary | ICD-10-CM

## 2020-09-03 HISTORY — DX: Presence of other vascular implants and grafts: Z95.828

## 2020-09-03 MED ORDER — PROCHLORPERAZINE MALEATE 10 MG PO TABS: 10.0000 mg | ORAL_TABLET | Freq: Four times a day (QID) | ORAL | 6 refills | Status: AC | PRN

## 2020-09-03 MED ORDER — PREDNISONE 20 MG PO TABS: ORAL_TABLET | ORAL | 0 refills | Status: AC

## 2020-09-03 MED ORDER — LIDOCAINE-PRILOCAINE 2.5-2.5 % EX CREA: TOPICAL_CREAM | CUTANEOUS | 3 refills | Status: AC

## 2020-09-04 ENCOUNTER — Inpatient Hospital Stay (HOSPITAL_BASED_OUTPATIENT_CLINIC_OR_DEPARTMENT_OTHER): Payer: Medicare Other | Admitting: Hematology

## 2020-09-04 ENCOUNTER — Other Ambulatory Visit (HOSPITAL_COMMUNITY): Admission: RE | Admit: 2020-09-04 | Payer: Medicare Other | Source: Ambulatory Visit

## 2020-09-04 ENCOUNTER — Other Ambulatory Visit (HOSPITAL_COMMUNITY)
Admission: RE | Admit: 2020-09-04 | Discharge: 2020-09-04 | Disposition: A | Discharge status: Home / Self Care | Payer: Medicare Other | Source: Ambulatory Visit | Attending: General Surgery | Admitting: General Surgery

## 2020-09-04 ENCOUNTER — Encounter (HOSPITAL_COMMUNITY): Payer: Self-pay | Admitting: Hematology

## 2020-09-04 VITALS — BP 148/74 | HR 89 | Temp 98.3°F | Resp 17 | Ht 60.0 in | Wt 114.1 lb

## 2020-09-04 DIAGNOSIS — C8330 Diffuse large B-cell lymphoma, unspecified site: Secondary | ICD-10-CM | POA: Diagnosis present

## 2020-09-04 DIAGNOSIS — C8338 Diffuse large B-cell lymphoma, lymph nodes of multiple sites: Secondary | ICD-10-CM | POA: Diagnosis not present

## 2020-09-04 DIAGNOSIS — Z20822 Contact with and (suspected) exposure to covid-19: Secondary | ICD-10-CM | POA: Insufficient documentation

## 2020-09-04 DIAGNOSIS — C8590 Non-Hodgkin lymphoma, unspecified, unspecified site: Secondary | ICD-10-CM | POA: Insufficient documentation

## 2020-09-04 DIAGNOSIS — Z5112 Encounter for antineoplastic immunotherapy: Secondary | ICD-10-CM | POA: Diagnosis not present

## 2020-09-04 DIAGNOSIS — Z95828 Presence of other vascular implants and grafts: Secondary | ICD-10-CM

## 2020-09-04 DIAGNOSIS — Z01812 Encounter for preprocedural laboratory examination: Secondary | ICD-10-CM | POA: Insufficient documentation

## 2020-09-04 LAB — CBC WITH DIFFERENTIAL/PLATELET
Abs Immature Granulocytes: 0.06 10*3/uL (ref 0.00–0.07)
Basophils Absolute: 0.1 10*3/uL (ref 0.0–0.1)
Basophils Relative: 1 %
Eosinophils Absolute: 0.4 10*3/uL (ref 0.0–0.5)
Eosinophils Relative: 6 %
HCT: 32.4 % — ABNORMAL LOW (ref 36.0–46.0)
Hemoglobin: 10.7 g/dL — ABNORMAL LOW (ref 12.0–15.0)
Immature Granulocytes: 1 %
Lymphocytes Relative: 9 %
Lymphs Abs: 0.7 10*3/uL (ref 0.7–4.0)
MCH: 28.6 pg (ref 26.0–34.0)
MCHC: 33 g/dL (ref 30.0–36.0)
MCV: 86.6 fL (ref 80.0–100.0)
Monocytes Absolute: 0.8 10*3/uL (ref 0.1–1.0)
Monocytes Relative: 11 %
Neutro Abs: 5.3 10*3/uL (ref 1.7–7.7)
Neutrophils Relative %: 72 %
Platelets: 166 10*3/uL (ref 150–400)
RBC: 3.74 MIL/uL — ABNORMAL LOW (ref 3.87–5.11)
RDW: 14.3 % (ref 11.5–15.5)
WBC: 7.3 10*3/uL (ref 4.0–10.5)
nRBC: 0 % (ref 0.0–0.2)

## 2020-09-04 LAB — HEPATITIS C ANTIBODY: HCV Ab: NONREACTIVE

## 2020-09-04 LAB — SARS CORONAVIRUS 2 (TAT 6-24 HRS): SARS Coronavirus 2: NEGATIVE

## 2020-09-04 LAB — URIC ACID: Uric Acid, Serum: 2.8 mg/dL (ref 2.5–7.1)

## 2020-09-04 LAB — HEPATITIS B SURFACE ANTIBODY,QUALITATIVE: Hep B S Ab: REACTIVE — AB

## 2020-09-04 LAB — LACTATE DEHYDROGENASE: LDH: 174 U/L (ref 98–192)

## 2020-09-04 LAB — HEPATITIS B SURFACE ANTIGEN: Hepatitis B Surface Ag: NONREACTIVE

## 2020-09-04 LAB — HEPATITIS B CORE ANTIBODY, TOTAL: Hep B Core Total Ab: NONREACTIVE

## 2020-09-04 NOTE — Progress Notes (Signed)
[place], [place] [address]   CLINIC:  Medical Oncology/Hematology  PCP:  Asencion Noble, MD [address] / [place] [address]  [phone]  REASON FOR VISIT:  Diffuse large B-cell lymphoma.  PRIOR THERAPY: None  CURRENT THERAPY: R-CHOP  INTERVAL HISTORY:  [name], a 80 y.o. female, seen for follow-up of her lymphoma.  She is here for bone marrow biopsy.  Continues to have lower extremity swelling.   REVIEW OF SYSTEMS:  Review of Systems  Gastrointestinal: Positive for constipation.  Psychiatric/Behavioral: Positive for depression and sleep disturbance.  All other systems reviewed and are negative.   PAST MEDICAL/SURGICAL HISTORY:  Past Medical History:  Diagnosis Date  . Port-A-Cath in place 09/03/2020  . Prolapse of female bladder, acquired 1973   Past Surgical History:  Procedure Laterality Date  . EYE SURGERY     cataracts- bilateral    . INGUINAL LYMPH NODE BIOPSY Left 08/23/2020   Procedure: INGUINAL LYMPH NODE BIOPSY, LEFT;  Surgeon: Virl Cagey, MD;  Location: [facility];  Service: General;  Laterality: Left;  . ORIF HUMERUS FRACTURE Left 08/02/2015   Procedure: OPEN REDUCTION INTERNAL FIXATION (ORIF)LEFT DISTAL RADIUS,HUMERUS FRACTURE;  Surgeon: Altamese Howland [facility], MD;  Location: [facility];  Service: Orthopedics;  Laterality: Left;  . TUBAL LIGATION    . VAGINAL DELIVERY      SOCIAL HISTORY:  Social History   Socioeconomic History  . Marital status: Married    Spouse name: Not on file  . Number of children: Not on file  . Years of education: Not on file  . Highest education level: Not on file  Occupational History  . Not on file  Tobacco Use  . Smoking status: Never Smoker  . Smokeless tobacco: Never Used  Vaping Use  . Vaping Use: Never used  Substance and Sexual Activity  . Alcohol use: Yes    Comment: wine glasses per day - 2 per day  . Drug use: No  . Sexual activity: Not Currently   Other Topics Concern  . Not on file  Social History Narrative  . Not on file   Social Determinants of Health   Financial Resource Strain: Not on file  Food Insecurity: Not on file  Transportation Needs: Not on file  Physical Activity: Not on file  Stress: Not on file  Social Connections: Not on file  Intimate Partner Violence: Not At Risk  . Fear of Current or Ex-Partner: No  . Emotionally Abused: No  . Physically Abused: No  . Sexually Abused: No    FAMILY HISTORY:  No family history on file.  CURRENT MEDICATIONS:  Current Outpatient Medications  Medication Sig Dispense Refill  . allopurinol (ZYLOPRIM) 300 MG tablet Take 1 tablet (300 mg total) by mouth daily. 30 tablet 2  . ALPRAZolam (XANAX) 0.25 MG tablet Take 0.5 tablets (0.125 mg total) by mouth at bedtime as needed for anxiety. 30 tablet 0  . [START ON 09/09/2020] CYCLOPHOSPHAMIDE IV Inject into the vein every 21 ( twenty-one) days.    Derrill Memo ON 09/09/2020] DOXORUBICIN HCL IV Inject into the vein every 21 ( twenty-one) days.    . Doxylamine Succinate, Sleep, (SLEEP AID PO) Take 1 tablet by mouth at bedtime.    . hydrOXYzine (ATARAX/VISTARIL) 10 MG tablet Take 10 mg by mouth 3 (three) times daily as needed for anxiety or itching.    . levocetirizine (XYZAL) 5 MG tablet Take 2.5 mg by mouth daily.    Marland Kitchen  lidocaine-prilocaine (EMLA) cream Apply a small amount to port a cath site and cover with plastic wrap 1 hour prior to infusion appointments. 30 g 3  . naproxen sodium (ALEVE) 220 MG tablet Take 110 mg by mouth 2 (two) times daily as needed (pain).    . ondansetron (ZOFRAN) 4 MG tablet Take 1 tablet (4 mg total) by mouth every 8 (eight) hours as needed. 30 tablet 1  . oxyCODONE (ROXICODONE) 5 MG immediate release tablet Take 1 tablet (5 mg total) by mouth every 4 (four) hours as needed for severe pain. 8 tablet 0  . predniSONE (DELTASONE) 20 MG tablet Take 5 tablets (100 mg) by mouth with breakfast daily on Days 1-5 of  chemotherapy treatments 150 tablet 0  . prochlorperazine (COMPAZINE) 10 MG tablet Take 1 tablet (10 mg total) by mouth every 6 (six) hours as needed (Nausea or vomiting). 30 tablet 6  . [START ON 09/09/2020] RITUXIMAB IV Inject into the vein every 21 ( twenty-one) days.    Derrill Memo ON 09/09/2020] VINCRISTINE SULFATE IV Inject into the vein every 21 ( twenty-one) days.     No current facility-administered medications for this visit.    ALLERGIES:  No Known Allergies  PHYSICAL EXAM:  Performance status (ECOG): 1 - Symptomatic but completely ambulatory  Vitals:   09/04/20 0735 09/04/20 0826  BP: (!) 161/72 (!) 148/74  Pulse: 92 89  Resp: 17 17  Temp: 97.6 F (36.4 C) 98.3 F (36.8 C)  SpO2: 95% 97%   Wt Readings from Last 3 Encounters:  09/04/20 114 lb 2 oz (51.8 kg)  08/29/20 114 lb 8 oz (51.9 kg)  08/21/20 113 lb (51.3 kg)   Physical Exam Vitals reviewed.  Constitutional:      Appearance: Normal appearance.  Cardiovascular:     Rate and Rhythm: Regular rhythm.     Heart sounds: Normal heart sounds.  Pulmonary:     Effort: Pulmonary effort is normal.     Breath sounds: Normal breath sounds.  Musculoskeletal:     Right lower leg: Edema present.     Left lower leg: Edema present.  Skin:    General: Skin is warm.  Neurological:     General: No focal deficit present.     Mental Status: She is alert and oriented to person, place, and time.  Psychiatric:        Mood and Affect: Mood normal.        Behavior: Behavior normal.     LABORATORY DATA:  I have reviewed the labs as listed.  CBC Latest Ref Rng & Units 09/04/2020 08/15/2020 08/01/2015  WBC 4.0 - 10.5 K/uL 7.3 7.8 8.5  Hemoglobin 12.0 - 15.0 g/dL 10.7(L) 10.5(L) 10.8(L)  Hematocrit 36.0 - 46.0 % 32.4(L) 33.3(L) 34.0(L)  Platelets 150 - 400 K/uL 166 222 255   CMP Latest Ref Rng & Units 08/15/2020 08/01/2015  Glucose 70 - 99 mg/dL 99 90  BUN 8 - 23 mg/dL 8 6  Creatinine 0.44 - 1.00 mg/dL 0.83 0.94  Sodium 135 - 145  mmol/L 134(L) 140  Potassium 3.5 - 5.1 mmol/L 4.3 3.3(L)  Chloride 98 - 111 mmol/L 95(L) 100(L)  CO2 22 - 32 mmol/L 29 28  Calcium 8.9 - 10.3 mg/dL 9.0 9.6  Total Protein 6.5 - 8.1 g/dL 7.0 7.0  Total Bilirubin 0.3 - 1.2 mg/dL 0.8 0.6  Alkaline Phos 38 - 126 U/L 122 91  AST 15 - 41 U/L 16 26  ALT 0 - 44 U/L  9 13(L)      Component Value Date/Time   RBC 3.74 (L) 09/04/2020 0826   MCV 86.6 09/04/2020 0826   MCH 28.6 09/04/2020 0826   MCHC 33.0 09/04/2020 0826   RDW 14.3 09/04/2020 0826   LYMPHSABS 0.7 09/04/2020 0826   MONOABS 0.8 09/04/2020 0826   EOSABS 0.4 09/04/2020 0826   BASOSABS 0.1 09/04/2020 0826   Surgical pathology [phone]) on 08/23/2020: Left inguinal lymph node biopsy: 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  DIAGNOSTIC IMAGING:  I have independently reviewed the scans and discussed with the patient. NM PET Image Initial (PI) Skull Base To Thigh  Result Date: 08/27/2020 CLINICAL DATA:  Initial treatment strategy for diffuse lymphadenopathy. EXAM: NUCLEAR MEDICINE PET SKULL BASE TO THIGH TECHNIQUE: 7.1 mCi F-18 FDG was injected intravenously. Full-ring PET imaging was performed from the skull base to thigh after the radiotracer. CT data was obtained and used for attenuation correction and anatomic localization. Fasting blood glucose: 69 mg/dl COMPARISON:  CT abdomen/pelvis dated 07/31/2020 FINDINGS: Mediastinal blood pool activity: SUV max 1.9 Liver activity: SUV max 2.4 NECK: Small bilateral supraclavicular nodes, measuring up to 10 mm short axis on the right (series 3/image 69), max SUV 20.5. Incidental CT findings: none CHEST: Mediastinal lymphadenopathy, including: --18 mm short axis AP window node (series 3/image 96), max SUV 13.7 --11 mm short axis prevascular node (series 3/image 103), max SUV 18.0 --10 mm short axis low right pretracheal node (series 3/image 103), max SUV 6.4 Bilateral subpectoral/axillary lymphadenopathy, including a 7 mm short axis left chest  wall node (series 3/image 86) with max SUV 7.2 and a 6 mm short axis right chest wall node (series 3/image 88) with max SUV 6.1. No suspicious pulmonary nodules. Incidental CT findings: Mild atherosclerotic calcifications of the aortic arch. ABDOMEN/PELVIS: Retroperitoneal/pelvic lymphadenopathy, including: --11 mm short axis aortocaval node (series 3/image 185), max SUV 10.2 --10 mm short axis right common iliac node (series 3/image 206), max SUV 11.5 --16 mm short axis bilateral pelvic sidewall nodes (series 3/images 254-255), max SUV 12.4 on the left and 19.2 on the right --Bilateral inguinal lymphadenopathy, right greater than left, including a dominant 19 mm short axis right inguinal node (series 3/image 268), max SUV 18.5 Spleen is normal in size, without focal lesion. Reference splenic hypermetabolism 3.7. No abnormal metabolism in the liver, spleen, pancreas, or adrenal glands. Incidental CT findings: Bladder prolapse. Associated moderate left hydroureteronephrosis with dilatation of the left ureter likely to the prolapsed pelvic floor (series 3/image 275). Superimposed large left extrarenal pelvis (series 3/image 189). Tiny hiatal hernia. Atherosclerotic calcifications the abdominal aorta. Left colonic diverticulosis, without evidence of diverticulitis. SKELETON: No focal hypermetabolic activity to suggest skeletal metastasis. Incidental CT findings: Mild degenerative changes of the visualized thoracolumbar spine. Subcutaneous edema in the lower pelvis and bilateral thighs. Suspected postsurgical changes with soft tissue gas in the left inguinal region. IMPRESSION: Supraclavicular, thoracic, and abdominopelvic lymphadenopathy, including dominant right inguinal nodes, as above. This appearance is suspicious for lymphoma. Correlate with pending surgical pathology results. Spleen is normal in size, without focal lesion. Bladder prolapse with associated moderate left hydroureteronephrosis and superimposed large  extrarenal pelvis. Electronically Signed   By: Julian Hy M.D.   On: 08/27/2020 11:05     ASSESSMENT:  1.  Diffuse lymphadenopathy in retroperitoneal, pelvic and inguinal regions: -Presentation with leg swelling starting end of October 2021 -Evaluated at urgent care on 05/15/2020 with leg Doppler-no DVT but showed enlarged lymph nodes in the right groin. -Was evaluated by Dr. Willey Blade  and a CT scan [facility] was ordered. -CTAP on 07/31/2020 showed retroperitoneal, pelvic and inguinal adenopathy with mild compression fracture deformity of L1 age-indeterminate.  Moderate left hydroureteronephrosis with prominent dilatation of the left renal pelvis. -PET scan on 08/26/2020 shows supraclavicular, thoracic, abdominal pelvic lymphadenopathy including dominant right inguinal lymph nodes.  Spleen is normal without focal lesion.  No focal hypermetabolic activity to suggest bone mets.  Bladder prolapse with associated moderate left hydroureteronephrosis and superimposed large extrarenal pelvis. 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  2.  Social/family history: -She worked as a Art therapist prior to retirement.  She was a never smoker. -She drinks wine occasionally. -Sister died of ovarian cancer at age 39.  Mother died of possible pancreatic cancer.   PLAN:  1.  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 -She will have a port placed and echocardiogram done later this week. -We talked about bone marrow biopsy to see if there is any involvement by large B-cell lymphoma or follicular lymphoma. -I talked about the procedure in detail including rare chance of infection/bleeding.  She is agreeable.  We will proceed with procedure.  2.  Generalized itching: -Continue hydroxyzine 10 mg 3 times daily as  needed.  3.  Left hydroureteronephrosis: -She will follow up with urology for bladder prolapse and associated left hydroureteronephrosis.  4.  TLS prophylaxis: -Uric acid was elevated at 7.6. -We have called in allopurinol 300 mg daily.  She has not started allopurinol yet.  She was encouraged to start today.  5.  Anxiety: -Continue Xanax 0.25 mg half tablet at bedtime.  Orders placed this encounter:  Orders Placed This Encounter  Procedures  . Uric acid     Derek Jack, MD [facility] [phone]

## 2020-09-04 NOTE — Progress Notes (Signed)
Chemotherapy/immunotherapy education packet given and discussed with pt in detail.  Discussed diagnosis, staging, tx regimen, and intent of tx.  Reviewed chemotherapy/immunotherapy medications and side effects, as well as pre-medications.  Instructed on how to manage side effects at home, and when to call the clinic.  Importance of fever/chills discussed with pt. Discussed precautions to implement at home after receiving tx, as well as self care strategies. Phone numbers provided for clinic during regular working hours, also how to reach the clinic after hours and on weekends. Pt provided the opportunity to ask questions - all questions answered to pt's satisfaction.    

## 2020-09-04 NOTE — Progress Notes (Signed)
Patient here today for bone marrow biopsy. Procedure explained and consent signed by all parties at [phone]. Patient placed in prone position with both arms above head. Time out conducted at 0746 and team agreed. Procedure started at 0748. Patient tolerated procedure well with minimal pain and discomfort. Specimens collected and labeled appropriately. Procedure completed at [place] applied and patient reposition on back, sitting up, resting at 0807. Specimens taken to lab for processing. Patient stable during procedure and discharged home in stable condition ambulatory  at 0829.

## 2020-09-04 NOTE — Progress Notes (Signed)
INDICATION: To evaluate for lymphomatous involvement of the bone marrow.   Bone Marrow Biopsy and Aspiration Procedure Note   The patient was identified by name and date of birth, prior to start of the procedure and a timeout was performed.   An informed consent was obtained after discussing potential risks including bleeding, infection and pain.  The left posterior iliac crest was palpated, cleaned with ChloraPrep, and drapes applied.  1% lidocaine is infiltrated into the skin, subcutaneous tissue and periosteum.  Bone marrow was aspirated and smears made.  With the help of Jamshidi needle a core biopsy was obtained.  Pressure was applied to the biopsy site and bandage was placed over the biopsy site. Patient was made to lie on the back for 15 mins prior to discharge.  The procedure was tolerated well. COMPLICATIONS: None BLOOD LOSS: none Patient was discharged home in stable condition to return in 2 weeks to review results.  Patient was provided with post bone marrow biopsy instructions and instructed to call if there was any bleeding or worsening pain.  Specimens sent for flow cytometry, cytogenetics and additional studies.  Signed Derek Jack, MD

## 2020-09-05 ENCOUNTER — Ambulatory Visit (INDEPENDENT_AMBULATORY_CARE_PROVIDER_SITE_OTHER): Payer: Medicare Other | Admitting: General Surgery

## 2020-09-05 ENCOUNTER — Encounter (HOSPITAL_COMMUNITY): Payer: Self-pay

## 2020-09-05 ENCOUNTER — Other Ambulatory Visit: Payer: Self-pay

## 2020-09-05 ENCOUNTER — Encounter: Payer: Self-pay | Admitting: General Surgery

## 2020-09-05 ENCOUNTER — Ambulatory Visit (HOSPITAL_COMMUNITY)
Admission: RE | Admit: 2020-09-05 | Discharge: 2020-09-05 | Disposition: A | Discharge status: Home / Self Care | Payer: Medicare Other | Source: Ambulatory Visit | Attending: Hematology | Admitting: Hematology

## 2020-09-05 VITALS — BP 154/90 | HR 76 | Temp 97.5°F | Resp 14 | Ht 60.0 in | Wt 115.0 lb

## 2020-09-05 DIAGNOSIS — Z01818 Encounter for other preprocedural examination: Secondary | ICD-10-CM | POA: Diagnosis not present

## 2020-09-05 DIAGNOSIS — C8338 Diffuse large B-cell lymphoma, lymph nodes of multiple sites: Secondary | ICD-10-CM

## 2020-09-05 DIAGNOSIS — Z0189 Encounter for other specified special examinations: Secondary | ICD-10-CM | POA: Diagnosis not present

## 2020-09-05 DIAGNOSIS — I080 Rheumatic disorders of both mitral and aortic valves: Secondary | ICD-10-CM | POA: Insufficient documentation

## 2020-09-05 DIAGNOSIS — R591 Generalized enlarged lymph nodes: Secondary | ICD-10-CM

## 2020-09-05 LAB — ECHOCARDIOGRAM COMPLETE
Area-P 1/2: 2.62 cm2
S' Lateral: 2.6 cm

## 2020-09-05 NOTE — Patient Instructions (Signed)

## 2020-09-05 NOTE — Progress Notes (Signed)
*  PRELIMINARY RESULTS* Echocardiogram 2D Echocardiogram has been performed.  Samuel Germany 09/05/2020, 1:53 PM

## 2020-09-06 ENCOUNTER — Ambulatory Visit (HOSPITAL_COMMUNITY): Payer: Medicare Other | Admitting: Certified Registered"

## 2020-09-06 ENCOUNTER — Encounter (HOSPITAL_COMMUNITY)
Admission: RE | Disposition: A | Discharge status: Home / Self Care | Payer: Self-pay | Source: Home / Self Care | Attending: General Surgery

## 2020-09-06 ENCOUNTER — Ambulatory Visit (HOSPITAL_COMMUNITY)
Admission: RE | Admit: 2020-09-06 | Discharge: 2020-09-06 | Disposition: A | Discharge status: Home / Self Care | Payer: Medicare Other | Attending: General Surgery | Admitting: General Surgery

## 2020-09-06 ENCOUNTER — Ambulatory Visit (HOSPITAL_COMMUNITY): Payer: Medicare Other

## 2020-09-06 ENCOUNTER — Encounter (HOSPITAL_COMMUNITY): Payer: Self-pay | Admitting: General Surgery

## 2020-09-06 DIAGNOSIS — Z79899 Other long term (current) drug therapy: Secondary | ICD-10-CM | POA: Insufficient documentation

## 2020-09-06 DIAGNOSIS — C8590 Non-Hodgkin lymphoma, unspecified, unspecified site: Secondary | ICD-10-CM | POA: Insufficient documentation

## 2020-09-06 DIAGNOSIS — C8330 Diffuse large B-cell lymphoma, unspecified site: Secondary | ICD-10-CM | POA: Diagnosis present

## 2020-09-06 DIAGNOSIS — Z95828 Presence of other vascular implants and grafts: Secondary | ICD-10-CM

## 2020-09-06 HISTORY — PX: PORTACATH PLACEMENT: SHX2246

## 2020-09-06 LAB — SURGICAL PATHOLOGY

## 2020-09-06 SURGERY — INSERTION, TUNNELED CENTRAL VENOUS DEVICE, WITH PORT
Anesthesia: General | Site: Chest | Laterality: Left

## 2020-09-06 MED ORDER — DEXMEDETOMIDINE (PRECEDEX) IN NS 20 MCG/5ML (4 MCG/ML) IV SYRINGE
PREFILLED_SYRINGE | INTRAVENOUS | Status: DC | PRN
  Administered 2020-09-06: 8 ug via INTRAVENOUS
  Administered 2020-09-06: 4 ug via INTRAVENOUS

## 2020-09-06 MED ORDER — FENTANYL CITRATE (PF) 250 MCG/5ML IJ SOLN
INTRAMUSCULAR | Status: DC | PRN
  Administered 2020-09-06: 50 ug via INTRAVENOUS

## 2020-09-06 MED ORDER — CHLORHEXIDINE GLUCONATE CLOTH 2 % EX PADS: 6.0000 | MEDICATED_PAD | Freq: Once | CUTANEOUS | Status: DC

## 2020-09-06 MED ORDER — PROPOFOL 500 MG/50ML IV EMUL
INTRAVENOUS | Status: DC | PRN
  Administered 2020-09-06: 100 ug/kg/min via INTRAVENOUS

## 2020-09-06 MED ORDER — SODIUM CHLORIDE (PF) 0.9 % IJ SOLN
INTRAMUSCULAR | Status: DC | PRN
  Administered 2020-09-06: 10 mL via INTRAVENOUS

## 2020-09-06 MED ORDER — CEFAZOLIN SODIUM-DEXTROSE 2-4 GM/100ML-% IV SOLN
2.0000 g | INTRAVENOUS | Status: AC
  Administered 2020-09-06: 2 g via INTRAVENOUS
  Filled 2020-09-06: qty 100

## 2020-09-06 MED ORDER — CHLORHEXIDINE GLUCONATE 0.12 % MT SOLN
15.0000 mL | Freq: Once | OROMUCOSAL | Status: AC
  Administered 2020-09-06: 15 mL via OROMUCOSAL

## 2020-09-06 MED ORDER — ONDANSETRON HCL 4 MG/2ML IJ SOLN
INTRAMUSCULAR | Status: DC | PRN
  Administered 2020-09-06: 4 mg via INTRAVENOUS

## 2020-09-06 MED ORDER — LIDOCAINE HCL (PF) 1 % IJ SOLN
INTRAMUSCULAR | Status: AC
  Filled 2020-09-06: qty 30

## 2020-09-06 MED ORDER — HEPARIN SOD (PORK) LOCK FLUSH 100 UNIT/ML IV SOLN
INTRAVENOUS | Status: AC
  Filled 2020-09-06: qty 5

## 2020-09-06 MED ORDER — ORAL CARE MOUTH RINSE: 15.0000 mL | Freq: Once | OROMUCOSAL | Status: AC

## 2020-09-06 MED ORDER — ONDANSETRON HCL 4 MG/2ML IJ SOLN: 4.0000 mg | Freq: Once | INTRAMUSCULAR | Status: DC | PRN

## 2020-09-06 MED ORDER — LIDOCAINE HCL (PF) 1 % IJ SOLN
INTRAMUSCULAR | Status: DC | PRN
  Administered 2020-09-06: 8 mL

## 2020-09-06 MED ORDER — FENTANYL CITRATE (PF) 100 MCG/2ML IJ SOLN
INTRAMUSCULAR | Status: AC
  Filled 2020-09-06: qty 2

## 2020-09-06 MED ORDER — PROPOFOL 10 MG/ML IV BOLUS
INTRAVENOUS | Status: AC
  Filled 2020-09-06: qty 20

## 2020-09-06 MED ORDER — DEXMEDETOMIDINE (PRECEDEX) IN NS 20 MCG/5ML (4 MCG/ML) IV SYRINGE
PREFILLED_SYRINGE | INTRAVENOUS | Status: AC
  Filled 2020-09-06: qty 5

## 2020-09-06 MED ORDER — PROPOFOL 10 MG/ML IV BOLUS
INTRAVENOUS | Status: DC | PRN
  Administered 2020-09-06: 20 mg via INTRAVENOUS

## 2020-09-06 MED ORDER — LACTATED RINGERS IV SOLN
INTRAVENOUS | Status: DC
  Administered 2020-09-06: 1000 mL via INTRAVENOUS

## 2020-09-06 MED ORDER — FENTANYL CITRATE (PF) 100 MCG/2ML IJ SOLN: 25.0000 ug | INTRAMUSCULAR | Status: DC | PRN

## 2020-09-06 MED ORDER — OXYCODONE HCL 5 MG PO TABS: 5.0000 mg | ORAL_TABLET | ORAL | 0 refills | Status: AC | PRN

## 2020-09-06 MED ORDER — HEPARIN SOD (PORK) LOCK FLUSH 100 UNIT/ML IV SOLN
INTRAVENOUS | Status: DC | PRN
  Administered 2020-09-06: 500 [IU] via INTRAVENOUS

## 2020-09-06 MED ORDER — ONDANSETRON HCL 4 MG/2ML IJ SOLN
INTRAMUSCULAR | Status: AC
  Filled 2020-09-06: qty 2

## 2020-09-06 SURGICAL SUPPLY — 32 items
ADH SKN CLS APL DERMABOND .7 (GAUZE/BANDAGES/DRESSINGS) ×1
APL PRP STRL LF ISPRP CHG 10.5 (MISCELLANEOUS) ×1
APPLICATOR CHLORAPREP 10.5 ORG (MISCELLANEOUS) ×2 IMPLANT
BAG DECANTER FOR FLEXI CONT (MISCELLANEOUS) ×2 IMPLANT
CLOTH BEACON ORANGE TIMEOUT ST (SAFETY) ×2 IMPLANT
COVER LIGHT HANDLE STERIS (MISCELLANEOUS) ×4 IMPLANT
COVER WAND RF STERILE (DRAPES) ×2 IMPLANT
DECANTER SPIKE VIAL GLASS SM (MISCELLANEOUS) ×2 IMPLANT
DERMABOND ADVANCED (GAUZE/BANDAGES/DRESSINGS) ×1
DERMABOND ADVANCED .7 DNX12 (GAUZE/BANDAGES/DRESSINGS) ×1 IMPLANT
DRAPE C-ARM FOLDED MOBILE STRL (DRAPES) ×2 IMPLANT
ELECT REM PT RETURN 9FT ADLT (ELECTROSURGICAL) ×2
ELECTRODE REM PT RTRN 9FT ADLT (ELECTROSURGICAL) ×1 IMPLANT
GLOVE SURG ENC MOIS LTX SZ6.5 (GLOVE) ×2 IMPLANT
GLOVE SURG UNDER POLY LF SZ6.5 (GLOVE) ×2 IMPLANT
GLOVE SURG UNDER POLY LF SZ7 (GLOVE) ×2 IMPLANT
GOWN STRL REUS W/TWL LRG LVL3 (GOWN DISPOSABLE) ×4 IMPLANT
IV NS 500ML (IV SOLUTION) ×2
IV NS 500ML BAXH (IV SOLUTION) ×1 IMPLANT
KIT PORT POWER 8FR ISP MRI (Port) ×2 IMPLANT
KIT TURNOVER KIT A (KITS) ×2 IMPLANT
MANIFOLD NEPTUNE II (INSTRUMENTS) ×2 IMPLANT
NEEDLE HYPO 25X1 1.5 SAFETY (NEEDLE) ×2 IMPLANT
PACK MINOR (CUSTOM PROCEDURE TRAY) ×2 IMPLANT
PAD ARMBOARD 7.5X6 YLW CONV (MISCELLANEOUS) ×2 IMPLANT
SET BASIN LINEN APH (SET/KITS/TRAYS/PACK) ×2 IMPLANT
SUT MNCRL AB 4-0 PS2 18 (SUTURE) ×2 IMPLANT
SUT PROLENE 2 0 SH 30 (SUTURE) ×2 IMPLANT
SUT VIC AB 3-0 SH 27 (SUTURE) ×2
SUT VIC AB 3-0 SH 27X BRD (SUTURE) ×1 IMPLANT
SYR 10ML LL (SYRINGE) ×4 IMPLANT
SYR CONTROL 10ML LL (SYRINGE) ×2 IMPLANT

## 2020-09-06 NOTE — Discharge Instructions (Signed)
Keep area clean and dry. You can take a shower in 24 hours. Do not submerge in water for 3-4 weeks.  Take tylenol and ibuprofen for pain control and Roxicodone for severe pain.  Implanted Port Insertion, Care After This sheet gives you information about how to care for yourself after your procedure. Your health care provider may also give you more specific instructions. If you have problems or questions, contact your health care provider. What can I expect after the procedure? After the procedure, it is common to have:  Discomfort at the port insertion site.  Bruising on the skin over the port. This should improve over 3-4 days. Follow these instructions at home: [facility] care  After your port is placed, you will get a manufacturer's information card. The card has information about your port. Keep this card with you at all times.  Take care of the port as told by your health care provider. Ask your health care provider if you or a family member can get training for taking care of the port at home. A home health care nurse may also take care of the port.  Make sure to remember what type of port you have. Incision care  Follow instructions from your health care provider about how to take care of your port insertion site. Make sure you: ? Wash your hands with soap and water before and after you change your bandage (dressing). If soap and water are not available, use hand sanitizer. ? Change your dressing as told by your health care provider. ? Leave stitches (sutures), skin glue, or adhesive strips in place. These skin closures may need to stay in place for 2 weeks or longer. If adhesive strip edges start to loosen and curl up, you may trim the loose edges. Do not remove adhesive strips completely unless your health care provider tells you to do that.  Check your port insertion site every day for signs of infection. Check for: ? Redness, swelling, or pain. ? Fluid or blood. ? Warmth. ? Pus or  a bad smell.      Activity  Return to your normal activities as told by your health care provider. Ask your health care provider what activities are safe for you.  Do not lift anything that is heavier than 10 lb (4.5 kg), or the limit that you are told, until your health care provider says that it is safe. General instructions  Take over-the-counter and prescription medicines only as told by your health care provider.  Do not take baths, swim, or use a hot tub until your health care provider approves. Ask your health care provider if you may take showers. You may only be allowed to take sponge baths.  Do not drive for 24 hours if you were given a sedative during your procedure.  Wear a medical alert bracelet in case of an emergency. This will tell any health care providers that you have a port.  Keep all follow-up visits as told by your health care provider. This is important. Contact a health care provider if:  You cannot flush your port with saline as directed, or you cannot draw blood from the port.  You have a fever or chills.  You have redness, swelling, or pain around your port insertion site.  You have fluid or blood coming from your port insertion site.  Your port insertion site feels warm to the touch.  You have pus or a bad smell coming from the port insertion site. Get  help right away if:  You have chest pain or shortness of breath.  You have bleeding from your port that you cannot control. Summary  Take care of the port as told by your health care provider. Keep the manufacturer's information card with you at all times.  Change your dressing as told by your health care provider.  Contact a health care provider if you have a fever or chills or if you have redness, swelling, or pain around your port insertion site.  Keep all follow-up visits as told by your health care provider. This information is not intended to replace advice given to you by your health care  provider. Make sure you discuss any questions you have with your health care provider. Document Revised: 12/28/2017 Document Reviewed: 12/28/2017 Elsevier Patient Education  2021 [place] After This sheet gives you information about how to care for yourself after your procedure. Your health care provider may also give you more specific instructions. If you have problems or questions, contact your health care provider. What can I expect after the procedure? After the procedure, it is common to have:  Tiredness.  Forgetfulness about what happened after the procedure.  Impaired judgment for important decisions.  Nausea or vomiting.  Some difficulty with balance. Follow these instructions at home: For the time period you were told by your health care provider:  Rest as needed.  Do not participate in activities where you could fall or become injured.  Do not drive or use machinery.  Do not drink alcohol.  Do not take sleeping pills or medicines that cause drowsiness.  Do not make important decisions or sign legal documents.  Do not take care of children on your own.      Eating and drinking  Follow the diet that is recommended by your health care provider.  Drink enough fluid to keep your urine pale yellow.  If you vomit: ? Drink water, juice, or soup when you can drink without vomiting. ? Make sure you have little or no nausea before eating solid foods. General instructions  Have a responsible adult stay with you for the time you are told. It is important to have someone help care for you until you are awake and alert.  Take over-the-counter and prescription medicines only as told by your health care provider.  If you have sleep apnea, surgery and certain medicines can increase your risk for breathing problems. Follow instructions from your health care provider about wearing your sleep device: ? Anytime you are sleeping,  including during daytime naps. ? While taking prescription pain medicines, sleeping medicines, or medicines that make you drowsy.  Avoid smoking.  Keep all follow-up visits as told by your health care provider. This is important. Contact a health care provider if:  You keep feeling nauseous or you keep vomiting.  You feel light-headed.  You are still sleepy or having trouble with balance after 24 hours.  You develop a rash.  You have a fever.  You have redness or swelling around the IV site. Get help right away if:  You have trouble breathing.  You have new-onset confusion at home. Summary  For several hours after your procedure, you may feel tired. You may also be forgetful and have poor judgment.  Have a responsible adult stay with you for the time you are told. It is important to have someone help care for you until you are awake and alert.  Rest as  told. Do not drive or operate machinery. Do not drink alcohol or take sleeping pills.  Get help right away if you have trouble breathing, or if you suddenly become confused. This information is not intended to replace advice given to you by your health care provider. Make sure you discuss any questions you have with your health care provider. Document Revised: 02/15/2020 Document Reviewed: 05/04/2019 Elsevier Patient Education  2021 [employer].

## 2020-09-06 NOTE — Op Note (Signed)
Operative Note 09/06/20   Preoperative Diagnosis:  Lymphoma    Postoperative Diagnosis: Same   Procedure(s) Performed: Port-A-Cath placement, left subclavian    Surgeon: Lanell Matar. Constance Haw, MD   Assistants: No qualified resident was available   Anesthesia: Monitored anesthesia care   Anesthesiologist: Louann Sjogren, MD    Specimens: None   Estimated Blood Loss: Minimal   Fluoroscopy time: 5 seconds   Blood Replacement: None    Complications: None    Operative Findings: Normal anatomy   Indications: Ms. Luviano is a 80 yo with lymphoma who going to start treatment. We discussed the risk of bleeding, infection, and risk of pneumothorax, injury to vessels.   Procedure: The patient was brought into the operating room and monitored anesthesia care was induced.  One percent lidocaine was used for local anesthesia.   The left chest and neck was prepped and draped in the usual sterile fashion.  Preoperative antibiotics were given.  An incision was made below the left clavicle. A subcutaneous pocket was formed. The needles advanced into the left subclavian vein using the Seldinger technique without difficulty. A guidewire was then advanced into the right atrium under fluoroscopic guidance.  Ectopia was not noted. An introducer and peel-away sheath were placed over the guidewire. The catheter was then inserted through the peel-away sheath and the peel-away sheath was removed.  A spot film was performed to confirm the position. The catheter was then attached to the port and the port placed in subcutaneous pocket. Adequate positioning was confirmed by fluoroscopy. Hemostasis was confirmed, and the port was secured with 2-0 prolene sutures.  Good backflow of blood was noted on aspiration of the port. The port was flushed with heparin flush. Subcutaneous layer was reapproximated using a 3-0 Vicryl interrupted suture. The skin was closed using a 4-0 Vicryl subcuticular suture. Dermabond was  applied.    All tape and needle counts were correct at the end of the procedure. The patient was transferred to PACU in stable condition. A chest x-ray will be performed at that time.  Curlene Labrum, MD [facility] [address] [place], [place] [address] [phone] (office)

## 2020-09-06 NOTE — Interval H&P Note (Signed)
History and Physical Interval Note:  09/06/2020 9:18 AM  Erica Mora  has presented today for surgery, with the diagnosis of Large B-Cell Lymphoma.  The various methods of treatment have been discussed with the patient and family. After consideration of risks, benefits and other options for treatment, the patient has consented to  Procedure(s): INSERTION PORT-A-CATH as a surgical intervention.  The patient's history has been reviewed, patient examined, no change in status, stable for surgery.  I have reviewed the patient's chart and labs.  Questions were answered to the patient's satisfaction.     Virl Cagey

## 2020-09-06 NOTE — H&P (Signed)
[facility] History and Physical      Chief Complaint    Post-op Follow-up      Erica Mora is a 80 y.o. female.  HPI: Erica Mora is a 80 yo with lymphoma diagnosed after a biopsy who is coming in for post operative evaluation and plans for her port placement. She is having no issues. She is doing well. She is nervous and anxious about the procedure.       Past Medical History:  Diagnosis Date  . Port-A-Cath in place 09/03/2020  . Prolapse of female bladder, acquired 1973         Past Surgical History:  Procedure Laterality Date  . EYE SURGERY     cataracts- bilateral    . INGUINAL LYMPH NODE BIOPSY Left 08/23/2020   Procedure: INGUINAL LYMPH NODE BIOPSY, LEFT;  Surgeon: Virl Cagey, MD;  Location: [facility];  Service: General;  Laterality: Left;  . ORIF HUMERUS FRACTURE Left 08/02/2015   Procedure: OPEN REDUCTION INTERNAL FIXATION (ORIF)LEFT DISTAL RADIUS,HUMERUS FRACTURE;  Surgeon: Altamese [place], MD;  Location: [place];  Service: Orthopedics;  Laterality: Left;  . TUBAL LIGATION    . VAGINAL DELIVERY      History reviewed. No pertinent family history.  Social History        Tobacco Use  . Smoking status: Never Smoker  . Smokeless tobacco: Never Used  Vaping Use  . Vaping Use: Never used  Substance Use Topics  . Alcohol use: Yes    Comment: wine glasses per day - 2 per day  . Drug use: No    Medications: I have reviewed the patient's current medications. Allergies as of 09/05/2020   No Known Allergies        Medication List       Accurate as of September 05, 2020 11:59 PM. If you have any questions, ask your nurse or doctor.        allopurinol 300 MG tablet Commonly known as: ZYLOPRIM Take 1 tablet (300 mg total) by mouth daily.   ALPRAZolam 0.25 MG tablet Commonly known as: XANAX Take 0.5 tablets (0.125 mg total) by mouth at bedtime as needed for anxiety.   CYCLOPHOSPHAMIDE IV Inject into  the vein every 21 ( twenty-one) days. Start taking on: September 09, 2020   DOXORUBICIN HCL IV Inject into the vein every 21 ( twenty-one) days. Start taking on: September 09, 2020   hydrOXYzine 10 MG tablet Commonly known as: ATARAX/VISTARIL Take 10 mg by mouth 3 (three) times daily as needed for anxiety or itching.   levocetirizine 5 MG tablet Commonly known as: XYZAL Take 2.5 mg by mouth daily.   lidocaine-prilocaine cream Commonly known as: EMLA Apply a small amount to port a cath site and cover with plastic wrap 1 hour prior to infusion appointments.   naproxen sodium 220 MG tablet Commonly known as: ALEVE Take 110 mg by mouth 2 (two) times daily as needed (pain).   ondansetron 4 MG tablet Commonly known as: Zofran Take 1 tablet (4 mg total) by mouth every 8 (eight) hours as needed.   oxyCODONE 5 MG immediate release tablet Commonly known as: Roxicodone Take 1 tablet (5 mg total) by mouth every 4 (four) hours as needed for severe pain.   predniSONE 20 MG tablet Commonly known as: DELTASONE Take 5 tablets (100 mg) by mouth with breakfast daily on Days 1-5 of chemotherapy treatments   prochlorperazine 10 MG tablet Commonly known as: COMPAZINE Take 1 tablet (10 mg  total) by mouth every 6 (six) hours as needed (Nausea or vomiting).   RITUXIMAB IV Inject into the vein every 21 ( twenty-one) days. Start taking on: September 09, 2020   SLEEP AID PO Take 1 tablet by mouth at bedtime.   VINCRISTINE SULFATE IV Inject into the vein every 21 ( twenty-one) days. Start taking on: September 09, 2020        ROS:  A comprehensive review of systems was negative except for: Musculoskeletal: positive for healing left inguinal incision  Blood pressure (!) 154/90, pulse 76, temperature (!) 97.5 F (36.4 C), temperature source Other (Comment), resp. rate 14, height 5' (1.524 m), weight 115 lb (52.2 kg), SpO2 97 %. Physical Exam Vitals reviewed.  Constitutional:       Appearance: Normal appearance.  HENT:     Head: Normocephalic.     Nose: Nose normal.  Eyes:     Pupils: Pupils are equal, round, and reactive to light.  Cardiovascular:     Rate and Rhythm: Normal rate and regular rhythm.  Pulmonary:     Breath sounds: Normal breath sounds.  Abdominal:     General: There is no distension.     Palpations: Abdomen is soft.     Tenderness: There is no abdominal tenderness.  Musculoskeletal:        General: Normal range of motion.     Cervical back: Normal range of motion.     Comments: Left inguinal incision healing, no erythema or drainage, mild induration, and likely small seroma  Skin:    General: Skin is warm.  Neurological:     General: No focal deficit present.     Mental Status: She is alert and oriented to person, place, and time.  Psychiatric:        Mood and Affect: Mood normal.        Behavior: Behavior normal.        Thought Content: Thought content normal.        Judgment: Judgment normal.     Results: Lab Results Last 48 Hours        Results for orders placed or performed during the hospital encounter of 09/04/20 (from the past 48 hour(s))  SARS CORONAVIRUS 2 (TAT 6-24 HRS) Nasopharyngeal Nasopharyngeal Swab     Status: None   Collection Time: 09/04/20 11:26 AM   Specimen: Nasopharyngeal Swab  Result Value Ref Range   SARS Coronavirus 2 NEGATIVE NEGATIVE    Comment: (NOTE) SARS-CoV-2 target nucleic acids are NOT DETECTED.  The SARS-CoV-2 RNA is generally detectable in upper and lower respiratory specimens during the acute phase of infection. Negative results do not preclude SARS-CoV-2 infection, do not rule out co-infections with other pathogens, and should not be used as the sole basis for treatment or other patient management decisions. Negative results must be combined with clinical observations, patient history, and epidemiological information. The expected result is Negative.  Fact Sheet for  Patients: SugarRoll.be  Fact Sheet for Healthcare Providers: https://www.woods-mathews.com/  This test is not yet approved or cleared by the [place] FDA and  has been authorized for detection and/or diagnosis of SARS-CoV-2 by FDA under an Emergency Use Authorization (EUA). This EUA will remain  in effect (meaning this test can be used) for the duration of the COVID-19 declaration under Se ction 564(b)(1) of the Act, 21 U.S.C. section 360bbb-3(b)(1), unless the authorization is terminated or revoked sooner.  Performed at [facility], [place] [address] [place], [place] [address]  Imaging Results (Last 48 hours)  ECHOCARDIOGRAM COMPLETE  Result Date: 09/05/2020    ECHOCARDIOGRAM REPORT   Patient Name:   HUXLEY VANWAGONER Date of Exam: 09/05/2020 Medical Rec #:  [id number]        Height:       60.0 in Accession #:    [id number]       Weight:       114.1 lb Date of Birth:  [birth year]        BSA:          1.471 m Patient Age:    2 years         BP:           153/77 mmHg Patient Gender: F                HR:           92 bpm. Exam Location:  [facility] Procedure: 2D Echo, Cardiac Doppler and Color Doppler Indications:    Chemo Z09  History:        Patient has no prior history of Echocardiogram examinations.                 Diffuse large B-cell lymphoma of lymph nodes of multiple                 regions.  Sonographer:    Alvino Chapel RCS Referring Phys: [phone] [place]  1. Left ventricular ejection fraction, by estimation, is 70 to 75%. The left ventricle has hyperdynamic function. The left ventricle has no regional wall motion abnormalities. Left ventricular diastolic parameters are consistent with Grade I diastolic dysfunction (impaired relaxation).  2. Right ventricular systolic function is normal. The right ventricular size is normal. There is normal pulmonary artery systolic pressure.  3. Left atrial size was  mildly dilated.  4. The mitral valve is normal in structure. Mild mitral valve regurgitation.  5. The aortic valve is tricuspid. Aortic valve regurgitation is mild. Mild aortic valve sclerosis is present, with no evidence of aortic valve stenosis.  6. The inferior vena cava is normal in size with greater than 50% respiratory variability, suggesting right atrial pressure of 3 mmHg. FINDINGS  Left Ventricle: Left ventricular ejection fraction, by estimation, is 70 to 75%. The left ventricle has hyperdynamic function. The left ventricle has no regional wall motion abnormalities. The left ventricular internal cavity size was normal in size. There is no left ventricular hypertrophy. Left ventricular diastolic parameters are consistent with Grade I diastolic dysfunction (impaired relaxation). Right Ventricle: The right ventricular size is normal. No increase in right ventricular wall thickness. Right ventricular systolic function is normal. There is normal pulmonary artery systolic pressure. The tricuspid regurgitant velocity is 2.74 m/s, and  with an assumed right atrial pressure of 3 mmHg, the estimated right ventricular systolic pressure is 88.4 mmHg. Left Atrium: Left atrial size was mildly dilated. Right Atrium: Right atrial size was normal in size. Pericardium: There is no evidence of pericardial effusion. Mitral Valve: The mitral valve is normal in structure. Mild mitral valve regurgitation. Tricuspid Valve: The tricuspid valve is normal in structure. Tricuspid valve regurgitation is mild. Aortic Valve: The aortic valve is tricuspid. Aortic valve regurgitation is mild. Mild aortic valve sclerosis is present, with no evidence of aortic valve stenosis. Pulmonic Valve: The pulmonic valve was not well visualized. Pulmonic valve regurgitation is not visualized. Aorta: The aortic root is normal in size and structure. Venous: The inferior vena cava is normal in  size with greater than 50% respiratory variability,  suggesting right atrial pressure of 3 mmHg. IAS/Shunts: The interatrial septum was not assessed.  LEFT VENTRICLE PLAX 2D LVIDd:         4.50 cm  Diastology LVIDs:         2.60 cm  LV e' medial:    5.44 cm/s LV PW:         1.00 cm  LV E/e' medial:  14.0 LV IVS:        0.90 cm  LV e' lateral:   4.68 cm/s LVOT diam:     1.70 cm  LV E/e' lateral: 16.3 LV SV:         67 LV SV Index:   46 LVOT Area:     2.27 cm  RIGHT VENTRICLE RV S prime:     15.40 cm/s TAPSE (M-mode): 2.5 cm LEFT ATRIUM             Index       RIGHT ATRIUM           Index LA diam:        3.00 cm 2.04 cm/m  RA Area:     12.00 cm LA Vol (A2C):   32.7 ml 22.24 ml/m RA Volume:   25.30 ml  17.20 ml/m LA Vol (A4C):   49.0 ml 33.32 ml/m LA Biplane Vol: 40.5 ml 27.54 ml/m  AORTIC VALVE LVOT Vmax:   147.00 cm/s LVOT Vmean:  90.400 cm/s LVOT VTI:    0.295 m  AORTA Ao Root diam: 3.00 cm MITRAL VALVE                TRICUSPID VALVE MV Area (PHT): 2.62 cm     TR Peak grad:   30.0 mmHg MV Decel Time: 289 msec     TR Vmax:        274.00 cm/s MV E velocity: 76.30 cm/s MV A velocity: 115.00 cm/s  SHUNTS MV E/A ratio:  0.66         Systemic VTI:  0.30 m                             Systemic Diam: 1.70 cm Dorris Carnes MD Electronically signed by Dorris Carnes MD Signature Date/Time: 09/05/2020/3:17:50 PM    Final       Assessment & Plan:  Ngoc Detjen is a 80 y.o. female with lymphoma. Planning for a port tomorrow. No major issues with her incision.  Discussed port and risk of bleeding, infection, pneumothorax, use of [place] and Xray.   All questions were answered to the satisfaction of the patient.     Virl Cagey 09/06/2020, 9:13 AM

## 2020-09-06 NOTE — Anesthesia Postprocedure Evaluation (Signed)
Anesthesia Post Note  Patient: Erica Mora  Procedure(s) Performed: INSERTION PORT-A-CATH (Left Chest)  Patient location during evaluation: PACU Anesthesia Type: MAC Level of consciousness: awake and alert, oriented and patient cooperative Pain management: pain level controlled Vital Signs Assessment: post-procedure vital signs reviewed and stable Respiratory status: spontaneous breathing and respiratory function stable Cardiovascular status: blood pressure returned to baseline Postop Assessment: no apparent nausea or vomiting Anesthetic complications: no   No complications documented.   Last Vitals:  Vitals:   09/06/20 1115 09/06/20 1132  BP: 140/86 137/75  Pulse: 74 79  Resp: 15 18  Temp:  36.6 C  SpO2: 96% 99%    Last Pain:  Vitals:   09/06/20 1132  TempSrc: Oral  PainSc: 0-No pain                 Shaneal Barasch, Arther Dames

## 2020-09-06 NOTE — Progress Notes (Signed)
[facility]  Notified husband that the port is in place. CXR pending.   Will See PRN. A few more Roxi sent to Pharmacy if needed for pain.   Curlene Labrum, MD [facility] [address] [place], [place] [address] [phone] (office)

## 2020-09-06 NOTE — Anesthesia Preprocedure Evaluation (Signed)
Anesthesia Evaluation  Patient identified by MRN, date of birth, ID band Patient awake    Reviewed: Allergy & Precautions, H&P , NPO status , Patient's Chart, lab work & pertinent test results, reviewed documented beta blocker date and time   Airway Mallampati: II  TM Distance: >3 FB Neck ROM: full    Dental no notable dental hx.    Pulmonary neg pulmonary ROS,    Pulmonary exam normal breath sounds clear to auscultation       Cardiovascular Exercise Tolerance: Good negative cardio ROS   Rhythm:regular Rate:Normal     Neuro/Psych negative neurological ROS  negative psych ROS   GI/Hepatic negative GI ROS, Neg liver ROS,   Endo/Other  negative endocrine ROS  Renal/GU negative Renal ROS  negative genitourinary   Musculoskeletal   Abdominal   Peds  Hematology negative hematology ROS (+)   Anesthesia Other Findings   Reproductive/Obstetrics negative OB ROS                             Anesthesia Physical Anesthesia Plan  ASA: II  Anesthesia Plan: General   Post-op Pain Management:    Induction:   PONV Risk Score and Plan: Propofol infusion  Airway Management Planned:   Additional Equipment:   Intra-op Plan:   Post-operative Plan:   Informed Consent: I have reviewed the patients History and Physical, chart, labs and discussed the procedure including the risks, benefits and alternatives for the proposed anesthesia with the patient or authorized representative who has indicated his/her understanding and acceptance.     Dental Advisory Given  Plan Discussed with: CRNA  Anesthesia Plan Comments:         Anesthesia Quick Evaluation

## 2020-09-06 NOTE — Transfer of Care (Signed)
Immediate Anesthesia Transfer of Care Note  Patient: Erica Mora  Procedure(s) Performed: INSERTION PORT-A-CATH (Left Chest)  Patient Location: PACU  Anesthesia Type:MAC  Level of Consciousness: awake, alert  and oriented  Airway & Oxygen Therapy: Patient Spontanous Breathing and Patient connected to nasal cannula oxygen  Post-op Assessment: Report given to RN, Post -op Vital signs reviewed and stable and Patient moving all extremities  Post vital signs: Reviewed and stable  Last Vitals:  Vitals Value Taken Time  BP    Temp    Pulse 72 09/06/20 1029  Resp 14 09/06/20 1029  SpO2 94 % 09/06/20 1029  Vitals shown include unvalidated device data.  Last Pain:  Vitals:   09/06/20 0826  TempSrc: Oral  PainSc: 0-No pain      Patients Stated Pain Goal: 6 (83/41/96 2229)  Complications: No complications documented.

## 2020-09-06 NOTE — Progress Notes (Signed)
[facility] History and Physical   Chief Complaint    Post-op Follow-up      [place] is a 80 y.o. female.  HPI: Ms. 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 She is having no issues. She is doing well. She is nervous and anxious about the procedure.   Past Medical History:  Diagnosis Date  . Port-A-Cath in place 09/03/2020  . Prolapse of female bladder, acquired 1973    Past Surgical History:  Procedure Laterality Date  . EYE SURGERY     cataracts- bilateral    . INGUINAL LYMPH NODE BIOPSY Left 08/23/2020   Procedure: INGUINAL LYMPH NODE BIOPSY, LEFT;  Surgeon: Virl Cagey, MD;  Location: [facility];  Service: General;  Laterality: Left;  . ORIF HUMERUS FRACTURE Left 08/02/2015   Procedure: OPEN REDUCTION INTERNAL FIXATION (ORIF)LEFT DISTAL RADIUS,HUMERUS FRACTURE;  Surgeon: Altamese [place], MD;  Location: [place];  Service: Orthopedics;  Laterality: Left;  . TUBAL LIGATION    . VAGINAL DELIVERY      History reviewed. No pertinent family history.  Social History   Tobacco Use  . Smoking status: Never Smoker  . Smokeless tobacco: Never Used  Vaping Use  . Vaping Use: Never used  Substance Use Topics  . Alcohol use: Yes    Comment: wine glasses per day - 2 per day  . Drug use: No    Medications: I have reviewed the patient's current medications. Allergies as of 09/05/2020   No Known Allergies     Medication List       Accurate as of September 05, 2020 11:59 PM. If you have any questions, ask your nurse or doctor.        allopurinol 300 MG tablet Commonly known as: ZYLOPRIM Take 1 tablet (300 mg total) by mouth daily.   ALPRAZolam 0.25 MG tablet Commonly known as: XANAX Take 0.5 tablets (0.125 mg total) by mouth at bedtime as needed for anxiety.   CYCLOPHOSPHAMIDE IV Inject into the vein every 21 ( twenty-one) days. Start taking on: September 09, 2020   DOXORUBICIN HCL IV Inject into the vein every 21 ( twenty-one) days. Start taking on: September 09, 2020   hydrOXYzine 10 MG tablet Commonly known as: ATARAX/VISTARIL Take 10 mg by mouth 3 (three) times daily as needed for anxiety or itching.   levocetirizine 5 MG tablet Commonly known as: XYZAL Take 2.5 mg by mouth daily.   lidocaine-prilocaine cream Commonly known as: EMLA Apply a small amount to port a cath site and cover with plastic wrap 1 hour prior to infusion appointments.   naproxen sodium 220 MG tablet Commonly known as: ALEVE Take 110 mg by mouth 2 (two) times daily as needed (pain).   ondansetron 4 MG tablet Commonly known as: Zofran Take 1 tablet (4 mg total) by mouth every 8 (eight) hours as needed.   oxyCODONE 5 MG immediate release tablet Commonly known as: Roxicodone Take 1 tablet (5 mg total) by mouth every 4 (four) hours as needed for severe pain.   predniSONE 20 MG tablet Commonly known as: DELTASONE Take 5 tablets (100 mg) by mouth with breakfast daily on Days 1-5 of chemotherapy treatments   prochlorperazine 10 MG tablet Commonly known as: COMPAZINE Take 1 tablet (10 mg total) by mouth every 6 (six) hours as needed (Nausea or vomiting).   RITUXIMAB IV Inject into the vein  every 21 ( twenty-one) days. Start taking on: September 09, 2020   SLEEP AID PO Take 1 tablet by mouth at bedtime.   VINCRISTINE SULFATE IV Inject into the vein every 21 ( twenty-one) days. Start taking on: September 09, 2020        ROS:  A comprehensive review of systems was negative except for: Musculoskeletal: positive for healing left inguinal incision  Blood pressure (!) 154/90, pulse 76, temperature (!) 97.5 F (36.4 C), temperature source Other (Comment), resp. rate 14, height 5' (1.524 m), weight 115 lb (52.2 kg), SpO2 97 %. Physical Exam Vitals reviewed.  Constitutional:      Appearance: Normal appearance.  HENT:     Head: Normocephalic.     Nose: Nose  normal.  Eyes:     Pupils: Pupils are equal, round, and reactive to light.  Cardiovascular:     Rate and Rhythm: Normal rate and regular rhythm.  Pulmonary:     Breath sounds: Normal breath sounds.  Abdominal:     General: There is no distension.     Palpations: Abdomen is soft.     Tenderness: There is no abdominal tenderness.  Musculoskeletal:        General: Normal range of motion.     Cervical back: Normal range of motion.     Comments: Left inguinal incision healing, no erythema or drainage, mild induration, and likely small seroma  Skin:    General: Skin is warm.  Neurological:     General: No focal deficit present.     Mental Status: She is alert and oriented to person, place, and time.  Psychiatric:        Mood and Affect: Mood normal.        Behavior: Behavior normal.        Thought Content: Thought content normal.        Judgment: Judgment normal.     Results: Results for orders placed or performed during the hospital encounter of 09/04/20 (from the past 48 hour(s))  SARS CORONAVIRUS 2 (TAT 6-24 HRS) Nasopharyngeal Nasopharyngeal Swab     Status: None   Collection Time: 09/04/20 11:26 AM   Specimen: Nasopharyngeal Swab  Result Value Ref Range   SARS Coronavirus 2 NEGATIVE NEGATIVE    Comment: (NOTE) SARS-CoV-2 target nucleic acids are NOT DETECTED.  The SARS-CoV-2 RNA is generally detectable in upper and lower respiratory specimens during the acute phase of infection. Negative results do not preclude SARS-CoV-2 infection, do not rule out co-infections with other pathogens, and should not be used as the sole basis for treatment or other patient management decisions. Negative results must be combined with clinical observations, patient history, and epidemiological information. The expected result is Negative.  Fact Sheet for Patients: SugarRoll.be  Fact Sheet for Healthcare  Providers: https://www.woods-mathews.com/  This test is not yet approved or cleared by the [place] FDA and  has been authorized for detection and/or diagnosis of SARS-CoV-2 by FDA under an Emergency Use Authorization (EUA). This EUA will remain  in effect (meaning this test can be used) for the duration of the COVID-19 declaration under Se ction 564(b)(1) of the Act, 21 U.S.C. section 360bbb-3(b)(1), unless the authorization is terminated or revoked sooner.  Performed at [facility], [place] [address] [place], [place] [address]     ECHOCARDIOGRAM COMPLETE  Result Date: 09/05/2020    ECHOCARDIOGRAM REPORT   Patient Name:   EMMALIN JAQUESS Date of Exam: 09/05/2020 Medical Rec #:  [id number]  Height:       60.0 in Accession #:    [id number]       Weight:       114.1 lb Date of Birth:  Aug 18, 1940        BSA:          1.471 m Patient Age:    66 years         BP:           153/77 mmHg Patient Gender: F                HR:           92 bpm. Exam Location:  [facility] Procedure: 2D Echo, Cardiac Doppler and Color Doppler Indications:    Chemo Z09  History:        Patient has no prior history of Echocardiogram examinations.                 Diffuse large B-cell lymphoma of lymph nodes of multiple                 regions.  Sonographer:    Alvino Chapel RCS Referring Phys: [phone] [place]  1. Left ventricular ejection fraction, by estimation, is 70 to 75%. The left ventricle has hyperdynamic function. The left ventricle has no regional wall motion abnormalities. Left ventricular diastolic parameters are consistent with Grade I diastolic dysfunction (impaired relaxation).  2. Right ventricular systolic function is normal. The right ventricular size is normal. There is normal pulmonary artery systolic pressure.  3. Left atrial size was mildly dilated.  4. The mitral valve is normal in structure. Mild mitral valve regurgitation.  5. The aortic valve is tricuspid.  Aortic valve regurgitation is mild. Mild aortic valve sclerosis is present, with no evidence of aortic valve stenosis.  6. The inferior vena cava is normal in size with greater than 50% respiratory variability, suggesting right atrial pressure of 3 mmHg. FINDINGS  Left Ventricle: Left ventricular ejection fraction, by estimation, is 70 to 75%. The left ventricle has hyperdynamic function. The left ventricle has no regional wall motion abnormalities. The left ventricular internal cavity size was normal in size. There is no left ventricular hypertrophy. Left ventricular diastolic parameters are consistent with Grade I diastolic dysfunction (impaired relaxation). Right Ventricle: The right ventricular size is normal. No increase in right ventricular wall thickness. Right ventricular systolic function is normal. There is normal pulmonary artery systolic pressure. The tricuspid regurgitant velocity is 2.74 m/s, and  with an assumed right atrial pressure of 3 mmHg, the estimated right ventricular systolic pressure is 01.6 mmHg. Left Atrium: Left atrial size was mildly dilated. Right Atrium: Right atrial size was normal in size. Pericardium: There is no evidence of pericardial effusion. Mitral Valve: The mitral valve is normal in structure. Mild mitral valve regurgitation. Tricuspid Valve: The tricuspid valve is normal in structure. Tricuspid valve regurgitation is mild. Aortic Valve: The aortic valve is tricuspid. Aortic valve regurgitation is mild. Mild aortic valve sclerosis is present, with no evidence of aortic valve stenosis. Pulmonic Valve: The pulmonic valve was not well visualized. Pulmonic valve regurgitation is not visualized. Aorta: The aortic root is normal in size and structure. Venous: The inferior vena cava is normal in size with greater than 50% respiratory variability, suggesting right atrial pressure of 3 mmHg. IAS/Shunts: The interatrial septum was not assessed.  LEFT VENTRICLE PLAX 2D LVIDd:          4.50 cm  Diastology LVIDs:  2.60 cm  LV e' medial:    5.44 cm/s LV PW:         1.00 cm  LV E/e' medial:  14.0 LV IVS:        0.90 cm  LV e' lateral:   4.68 cm/s LVOT diam:     1.70 cm  LV E/e' lateral: 16.3 LV SV:         67 LV SV Index:   46 LVOT Area:     2.27 cm  RIGHT VENTRICLE RV S prime:     15.40 cm/s TAPSE (M-mode): 2.5 cm LEFT ATRIUM             Index       RIGHT ATRIUM           Index LA diam:        3.00 cm 2.04 cm/m  RA Area:     12.00 cm LA Vol (A2C):   32.7 ml 22.24 ml/m RA Volume:   25.30 ml  17.20 ml/m LA Vol (A4C):   49.0 ml 33.32 ml/m LA Biplane Vol: 40.5 ml 27.54 ml/m  AORTIC VALVE LVOT Vmax:   147.00 cm/s LVOT Vmean:  90.400 cm/s LVOT VTI:    0.295 m  AORTA Ao Root diam: 3.00 cm MITRAL VALVE                TRICUSPID VALVE MV Area (PHT): 2.62 cm     TR Peak grad:   30.0 mmHg MV Decel Time: 289 msec     TR Vmax:        274.00 cm/s MV E velocity: 76.30 cm/s MV A velocity: 115.00 cm/s  SHUNTS MV E/A ratio:  0.66         Systemic VTI:  0.30 m                             Systemic Diam: 1.70 cm Dorris Carnes MD Electronically signed by Dorris Carnes MD Signature Date/Time: 09/05/2020/3:17:50 PM    Final      Assessment & Plan:  Madelyne Millikan is a 80 y.o. female with lymphoma. Planning for a port tomorrow. No major issues with her incision.  Discussed port and risk of bleeding, infection, pneumothorax, use of [place] and Xray.   All questions were answered to the satisfaction of the patient.     Virl Cagey 09/06/2020, 9:13 AM

## 2020-09-09 ENCOUNTER — Inpatient Hospital Stay (HOSPITAL_COMMUNITY): Payer: Medicare Other

## 2020-09-09 ENCOUNTER — Inpatient Hospital Stay (HOSPITAL_BASED_OUTPATIENT_CLINIC_OR_DEPARTMENT_OTHER): Payer: Medicare Other | Admitting: Hematology

## 2020-09-09 ENCOUNTER — Encounter (HOSPITAL_COMMUNITY): Payer: Self-pay | Admitting: Hematology

## 2020-09-09 ENCOUNTER — Other Ambulatory Visit: Payer: Self-pay

## 2020-09-09 VITALS — BP 133/65 | HR 78 | Temp 97.2°F | Resp 18 | Wt 116.4 lb

## 2020-09-09 VITALS — BP 99/62 | HR 76 | Temp 97.0°F | Resp 18

## 2020-09-09 DIAGNOSIS — Z5112 Encounter for antineoplastic immunotherapy: Secondary | ICD-10-CM | POA: Diagnosis not present

## 2020-09-09 DIAGNOSIS — C8330 Diffuse large B-cell lymphoma, unspecified site: Secondary | ICD-10-CM

## 2020-09-09 DIAGNOSIS — Z95828 Presence of other vascular implants and grafts: Secondary | ICD-10-CM

## 2020-09-09 LAB — CBC WITH DIFFERENTIAL/PLATELET
Abs Immature Granulocytes: 0.09 10*3/uL — ABNORMAL HIGH (ref 0.00–0.07)
Basophils Absolute: 0 10*3/uL (ref 0.0–0.1)
Basophils Relative: 0 %
Eosinophils Absolute: 0.7 10*3/uL — ABNORMAL HIGH (ref 0.0–0.5)
Eosinophils Relative: 8 %
HCT: 31.1 % — ABNORMAL LOW (ref 36.0–46.0)
Hemoglobin: 10.1 g/dL — ABNORMAL LOW (ref 12.0–15.0)
Immature Granulocytes: 1 %
Lymphocytes Relative: 5 %
Lymphs Abs: 0.4 10*3/uL — ABNORMAL LOW (ref 0.7–4.0)
MCH: 27.8 pg (ref 26.0–34.0)
MCHC: 32.5 g/dL (ref 30.0–36.0)
MCV: 85.7 fL (ref 80.0–100.0)
Monocytes Absolute: 0.3 10*3/uL (ref 0.1–1.0)
Monocytes Relative: 4 %
Neutro Abs: 7.4 10*3/uL (ref 1.7–7.7)
Neutrophils Relative %: 82 %
Platelets: 168 10*3/uL (ref 150–400)
RBC: 3.63 MIL/uL — ABNORMAL LOW (ref 3.87–5.11)
RDW: 14.1 % (ref 11.5–15.5)
WBC: 9 10*3/uL (ref 4.0–10.5)
nRBC: 0 % (ref 0.0–0.2)

## 2020-09-09 LAB — COMPREHENSIVE METABOLIC PANEL
ALT: 7 U/L (ref 0–44)
AST: 18 U/L (ref 15–41)
Albumin: 2.5 g/dL — ABNORMAL LOW (ref 3.5–5.0)
Alkaline Phosphatase: 97 U/L (ref 38–126)
Anion gap: 9 (ref 5–15)
BUN: 11 mg/dL (ref 8–23)
CO2: 28 mmol/L (ref 22–32)
Calcium: 8.3 mg/dL — ABNORMAL LOW (ref 8.9–10.3)
Chloride: 91 mmol/L — ABNORMAL LOW (ref 98–111)
Creatinine, Ser: 0.75 mg/dL (ref 0.44–1.00)
GFR, Estimated: >60 mL/min (ref >60)
Glucose, Bld: 115 mg/dL — ABNORMAL HIGH (ref 70–99)
Potassium: 3.3 mmol/L — ABNORMAL LOW (ref 3.5–5.1)
Sodium: 128 mmol/L — ABNORMAL LOW (ref 135–145)
Total Bilirubin: 0.8 mg/dL (ref 0.3–1.2)
Total Protein: 6.1 g/dL — ABNORMAL LOW (ref 6.5–8.1)

## 2020-09-09 MED ORDER — FAMOTIDINE 20 MG PO TABS
20.0000 mg | ORAL_TABLET | Freq: Once | ORAL | Status: AC
  Administered 2020-09-09: 20 mg via ORAL
  Filled 2020-09-09: qty 1

## 2020-09-09 MED ORDER — POTASSIUM CHLORIDE CRYS ER 20 MEQ PO TBCR
20.0000 meq | EXTENDED_RELEASE_TABLET | Freq: Once | ORAL | Status: AC
  Administered 2020-09-09: 20 meq via ORAL
  Filled 2020-09-09: qty 1

## 2020-09-09 MED ORDER — SODIUM CHLORIDE 0.9% FLUSH
10.0000 mL | INTRAVENOUS | Status: DC | PRN
  Administered 2020-09-09 (×2): 10 mL

## 2020-09-09 MED ORDER — DOXORUBICIN HCL CHEMO IV INJECTION 2 MG/ML
25.0000 mg/m2 | Freq: Once | INTRAVENOUS | Status: AC
  Administered 2020-09-09: 38 mg via INTRAVENOUS
  Filled 2020-09-09: qty 19

## 2020-09-09 MED ORDER — HEPARIN SOD (PORK) LOCK FLUSH 100 UNIT/ML IV SOLN
500.0000 [IU] | Freq: Once | INTRAVENOUS | Status: AC | PRN
  Administered 2020-09-09: 500 [IU]

## 2020-09-09 MED ORDER — VINCRISTINE SULFATE CHEMO INJECTION 1 MG/ML
2.0000 mg | Freq: Once | INTRAVENOUS | Status: AC
  Administered 2020-09-09: 2 mg via INTRAVENOUS
  Filled 2020-09-09: qty 2

## 2020-09-09 MED ORDER — ACETAMINOPHEN 325 MG PO TABS
650.0000 mg | ORAL_TABLET | Freq: Once | ORAL | Status: AC
  Administered 2020-09-09: 650 mg via ORAL
  Filled 2020-09-09: qty 2

## 2020-09-09 MED ORDER — SODIUM CHLORIDE 0.9 % IV SOLN
150.0000 mg | Freq: Once | INTRAVENOUS | Status: AC
  Administered 2020-09-09: 150 mg via INTRAVENOUS
  Filled 2020-09-09: qty 150

## 2020-09-09 MED ORDER — SODIUM CHLORIDE 0.9 % IV SOLN: Freq: Once | INTRAVENOUS | Status: AC

## 2020-09-09 MED ORDER — RITUXIMAB-PVVR CHEMO 500 MG/50ML IV SOLN
375.0000 mg/m2 | Freq: Once | INTRAVENOUS | Status: AC
  Administered 2020-09-09: 600 mg via INTRAVENOUS
  Filled 2020-09-09: qty 50

## 2020-09-09 MED ORDER — SODIUM CHLORIDE 0.9 % IV SOLN
10.0000 mg | Freq: Once | INTRAVENOUS | Status: AC
  Administered 2020-09-09: 10 mg via INTRAVENOUS
  Filled 2020-09-09: qty 10

## 2020-09-09 MED ORDER — DIPHENHYDRAMINE HCL 25 MG PO CAPS
50.0000 mg | ORAL_CAPSULE | Freq: Once | ORAL | Status: AC
Start: 2020-09-09 — End: 2020-09-09
  Administered 2020-09-09: 50 mg via ORAL
  Filled 2020-09-09: qty 2

## 2020-09-09 MED ORDER — SODIUM CHLORIDE 0.9 % IV SOLN
500.0000 mg/m2 | Freq: Once | INTRAVENOUS | Status: AC
  Administered 2020-09-09: 740 mg via INTRAVENOUS
  Filled 2020-09-09: qty 37

## 2020-09-09 MED ORDER — SODIUM CHLORIDE 0.9 % IV SOLN
3.0000 mg | Freq: Once | INTRAVENOUS | Status: AC
  Administered 2020-09-09: 3 mg via INTRAVENOUS
  Filled 2020-09-09: qty 2

## 2020-09-09 MED ORDER — PALONOSETRON HCL INJECTION 0.25 MG/5ML
0.2500 mg | Freq: Once | INTRAVENOUS | Status: AC
  Administered 2020-09-09: 0.25 mg via INTRAVENOUS
  Filled 2020-09-09: qty 5

## 2020-09-09 NOTE — Patient Instructions (Signed)
[place] Discharge Instructions for Patients Receiving Chemotherapy  Today you received the following chemotherapy agents Rituxan, adriamycin, cytoxan, and vincristine.    If you develop nausea and vomiting that is not controlled by your nausea medication, call the clinic.   BELOW ARE SYMPTOMS THAT SHOULD BE REPORTED IMMEDIATELY:  *FEVER GREATER THAN 100.5 F  *CHILLS WITH OR WITHOUT FEVER  NAUSEA AND VOMITING THAT IS NOT CONTROLLED WITH YOUR NAUSEA MEDICATION  *UNUSUAL SHORTNESS OF BREATH  *UNUSUAL BRUISING OR BLEEDING  TENDERNESS IN MOUTH AND THROAT WITH OR WITHOUT PRESENCE OF ULCERS  *URINARY PROBLEMS  *BOWEL PROBLEMS  UNUSUAL RASH Items with * indicate a potential emergency and should be followed up as soon as possible.  Feel free to call the clinic should you have any questions or concerns. The clinic phone number is [phone].  Please show the [facility] at check-in to the Emergency Department and triage nurse.

## 2020-09-09 NOTE — Patient Instructions (Signed)
[place] at [facility] Discharge Instructions  You were seen today by Dr. Delton Coombes. He went over your recent results. You received your first treatment today. Side effects may include hair loss, nausea and abnormal blood counts. Take Zofran as needed for nausea. Eat protein-dense meals and drink Ensure/Boost Max Protein to improve your blood albumin levels. Dr. Delton Coombes will see you back in 1 week for labs and follow up.   Thank you for choosing [place] at [facility] to provide your oncology and hematology care.  To afford each patient quality time with our provider, please arrive at least 15 minutes before your scheduled appointment time.   If you have a lab appointment with the [facility] please come in thru the Main Entrance and check in at the main information desk  You need to re-schedule your appointment should you arrive 10 or more minutes late.  We strive to give you quality time with our providers, and arriving late affects you and other patients whose appointments are after yours.  Also, if you no show three or more times for appointments you may be dismissed from the clinic at the providers discretion.     Again, thank you for choosing [facility].  Our hope is that these requests will decrease the amount of time that you wait before being seen by our physicians.       _____________________________________________________________  Should you have questions after your visit to [facility], please contact our office at [phone] between the hours of 8:00 a.m. and 4:30 p.m.  Voicemails left after 4:00 p.m. will not be returned until the following business day.  For prescription refill requests, have your pharmacy contact our office and allow 72 hours.    Cancer Center Support Programs:   > Cancer Support Group  2nd Tuesday of the month 1pm-2pm, Journey Room

## 2020-09-09 NOTE — Progress Notes (Signed)
To treatment room for therapy and oncology follow up visit.  Family at side.  Patient stated she started her Allopurinol as directed and ECHO 09/05/2020.  Patient denied any complaints of pain.  Patient has a rash on back, abdomen, arms, and legs.  Bilateral leg swelling noted with redness.  Dr. Delton Coombes aware for follow up visit today.  No s/s of distress noted.    Good blood return noted before, during, and after administration of Adriamycin.  No bruising or swelling noted at site.   Patient tolerated chemotherapy with no complaints voiced.  Side effects with management reviewed with understanding verbalized.  Port site clean and dry with no bruising or swelling noted at site.  Good blood return noted before and after administration of chemotherapy.  Band aid applied.  Patient left in satisfactory condition with VSS and no s/s of distress noted.

## 2020-09-09 NOTE — Progress Notes (Signed)
Patient was assessed by Dr. Katragadda and labs have been reviewed.  Patient is okay to proceed with treatment today. Primary RN and pharmacy aware.   

## 2020-09-09 NOTE — Progress Notes (Signed)
.   Pharmacist Chemotherapy Monitoring - Initial Assessment    Anticipated start date: 09/09/20   Regimen:  . Are orders appropriate based on the patient's diagnosis, regimen, and cycle? Yes . Does the plan date match the patient's scheduled date? Yes . Is the sequencing of drugs appropriate? Yes . Are the premedications appropriate for the patient's regimen? Yes . Prior Authorization for treatment is: Approved o If applicable, is the correct biosimilar selected based on the patient's insurance? yes  Organ Function and Labs: Marland Kitchen Are dose adjustments needed based on the patient's renal function, hepatic function, or hematologic function? No . Are appropriate labs ordered prior to the start of patient's treatment? Yes . Other organ system assessment, if indicated: anthracyclines: Echo/ MUGA . The following baseline labs, if indicated, have been ordered: rituximab: baseline Hepatitis B labs  Dose Assessment: . Are the drug doses appropriate? Yes . Are the following correct: o Drug concentrations Yes o IV fluid compatible with drug Yes o Administration routes Yes o Timing of therapy Yes . If applicable, does the patient have documented access for treatment and/or plans for port-a-cath placement? yes . If applicable, have lifetime cumulative doses been properly documented and assessed? not applicable Lifetime Dose Tracking  No doses have been documented on this patient for the following tracked chemicals: Doxorubicin, Epirubicin, Idarubicin, Daunorubicin, Mitoxantrone, Bleomycin, Oxaliplatin, Carboplatin, Liposomal Doxorubicin  o   Toxicity Monitoring/Prevention: . The patient has the following take home antiemetics prescribed: Prochlorperazine . The patient has the following take home medications prescribed: Tumor Lysis Syndrome prophylaxis . Medication allergies and previous infusion related reactions, if applicable, have been reviewed and addressed. Yes . The patient's current medication  list has been assessed for drug-drug interactions with their chemotherapy regimen. no significant drug-drug interactions were identified on review.  Order Review: . Are the treatment plan orders signed? Yes . Is the patient scheduled to see a provider prior to their treatment? Yes  I verify that I have reviewed each item in the above checklist and answered each question accordingly.  Wynona Neat, RPH, 09/09/2020  8:14 AM

## 2020-09-09 NOTE — Progress Notes (Signed)
[place], [place] [address]   CLINIC:  Medical Oncology/Hematology  PCP:  Asencion Noble, MD [address] / [place] [address] [phone]   REASON FOR VISIT:  Follow-up for DLBCL  PRIOR THERAPY: None  NGS Results: Not done  CURRENT THERAPY: R-CHOP every 3 weeks  BRIEF ONCOLOGIC HISTORY:  Oncology History  DLBCL (diffuse large B cell lymphoma) ([place])  08/29/2020 Initial Diagnosis   DLBCL (diffuse large B cell lymphoma) ([place])   08/29/2020 Cancer Staging   Staging form: Hodgkin and Non-Hodgkin Lymphoma, AJCC 8th Edition - Clinical stage from 08/29/2020: Stage III - Signed by Derek Jack, MD on 08/29/2020 Stage prefix: Initial diagnosis   09/09/2020 -  Chemotherapy    Patient is on Treatment Plan: NON-HODGKINS LYMPHOMA R-CHOP Q21D        CANCER STAGING: Cancer Staging DLBCL (diffuse large B cell lymphoma) ([place]) Staging form: Hodgkin and Non-Hodgkin Lymphoma, AJCC 8th Edition - Clinical stage from 08/29/2020: Stage III - Signed by Derek Jack, MD on 08/29/2020   INTERVAL HISTORY:  Ms. Kemesha Mosey, a 80 y.o. female, returns for routine follow-up and consideration for first cycle of chemotherapy. Biana was last seen on 09/04/2020.  Due for initiating cycle #1 of R-CHOP today.   Today she is accompanied by her husband. Overall, she tells me she has been feeling okay. She has started taking allopurinol. She continues having feet swelling and itching, which is worse during the day and located around her bra line, mid-back, left shoulder and forehead. She is taking Xanax at bedtime to help her sleep. She is eating fruits daily. She occasionally takes Lasix 20 mg for her leg swelling.  Overall, she feels ready for first cycle of chemo today.    REVIEW OF SYSTEMS:  Review of Systems  Constitutional: Positive for appetite change (75%) and fatigue (50%).  Cardiovascular: Positive for leg swelling (R leg >  L).  Skin: Positive for itching (bra line, mid-back, L shoulder & forehead).  Psychiatric/Behavioral: The patient is nervous/anxious (on Xanax).   All other systems reviewed and are negative.   PAST MEDICAL/SURGICAL HISTORY:  Past Medical History:  Diagnosis Date  . Port-A-Cath in place 09/03/2020  . Prolapse of female bladder, acquired 1973   Past Surgical History:  Procedure Laterality Date  . EYE SURGERY     cataracts- bilateral    . INGUINAL LYMPH NODE BIOPSY Left 08/23/2020   Procedure: INGUINAL LYMPH NODE BIOPSY, LEFT;  Surgeon: Virl Cagey, MD;  Location: [facility];  Service: General;  Laterality: Left;  . ORIF HUMERUS FRACTURE Left 08/02/2015   Procedure: OPEN REDUCTION INTERNAL FIXATION (ORIF)LEFT DISTAL RADIUS,HUMERUS FRACTURE;  Surgeon: Altamese , MD;  Location: [facility];  Service: Orthopedics;  Laterality: Left;  . TUBAL LIGATION    . VAGINAL DELIVERY      SOCIAL HISTORY:  Social History   Socioeconomic History  . Marital status: Married    Spouse name: Not on file  . Number of children: Not on file  . Years of education: Not on file  . Highest education level: Not on file  Occupational History  . Not on file  Tobacco Use  . Smoking status: Never Smoker  . Smokeless tobacco: Never Used  Vaping Use  . Vaping Use: Never used  Substance and Sexual Activity  . Alcohol use: Yes    Comment: wine glasses per day - 2 per day  . Drug use: No  . Sexual activity: Not Currently  Other Topics Concern  . Not on file  Social History Narrative  . Not on file   Social Determinants of Health   Financial Resource Strain: Not on file  Food Insecurity: Not on file  Transportation Needs: Not on file  Physical Activity: Not on file  Stress: Not on file  Social Connections: Not on file  Intimate Partner Violence: Not At Risk  . Fear of Current or Ex-Partner: No  . Emotionally Abused: No  . Physically Abused: No  . Sexually Abused: No    FAMILY HISTORY:  History  reviewed. No pertinent family history.  CURRENT MEDICATIONS:  Current Outpatient Medications  Medication Sig Dispense Refill  . allopurinol (ZYLOPRIM) 300 MG tablet Take 1 tablet (300 mg total) by mouth daily. 30 tablet 2  . ALPRAZolam (XANAX) 0.25 MG tablet Take 0.5 tablets (0.125 mg total) by mouth at bedtime as needed for anxiety. 30 tablet 0  . CYCLOPHOSPHAMIDE IV Inject into the vein every 21 ( twenty-one) days.    Marland Kitchen DOXORUBICIN HCL IV Inject into the vein every 21 ( twenty-one) days.    . Doxylamine Succinate, Sleep, (SLEEP AID PO) Take 1 tablet by mouth at bedtime.    . hydrOXYzine (ATARAX/VISTARIL) 10 MG tablet Take 10 mg by mouth 3 (three) times daily as needed for anxiety or itching.    . lidocaine-prilocaine (EMLA) cream Apply a small amount to port a cath site and cover with plastic wrap 1 hour prior to infusion appointments. 30 g 3  . naproxen sodium (ALEVE) 220 MG tablet Take 110 mg by mouth 2 (two) times daily as needed (pain).    . ondansetron (ZOFRAN) 4 MG tablet Take 1 tablet (4 mg total) by mouth every 8 (eight) hours as needed. 30 tablet 1  . oxyCODONE (ROXICODONE) 5 MG immediate release tablet Take 1 tablet (5 mg total) by mouth every 4 (four) hours as needed for severe pain or breakthrough pain. 5 tablet 0  . predniSONE (DELTASONE) 20 MG tablet Take 5 tablets (100 mg) by mouth with breakfast daily on Days 1-5 of chemotherapy treatments 150 tablet 0  . prochlorperazine (COMPAZINE) 10 MG tablet Take 1 tablet (10 mg total) by mouth every 6 (six) hours as needed (Nausea or vomiting). 30 tablet 6  . RITUXIMAB IV Inject into the vein every 21 ( twenty-one) days.    Marland Kitchen VINCRISTINE SULFATE IV Inject into the vein every 21 ( twenty-one) days.     No current facility-administered medications for this visit.   Facility-Administered Medications Ordered in Other Visits  Medication Dose Route Frequency Provider Last Rate Last Admin  . potassium chloride SA (KLOR-CON) CR tablet 20 mEq   20 mEq Oral Once Derek Jack, MD        ALLERGIES:  No Known Allergies  PHYSICAL EXAM:  Performance status (ECOG): 1 - Symptomatic but completely ambulatory  Vitals:   09/09/20 0807  BP: 133/65  Pulse: 78  Resp: 18  Temp: (!) 97.2 F (36.2 C)  SpO2: 99%   Wt Readings from Last 3 Encounters:  09/09/20 116 lb 6.4 oz (52.8 kg)  09/05/20 115 lb (52.2 kg)  09/04/20 114 lb 2 oz (51.8 kg)   Physical Exam Vitals reviewed.  Constitutional:      Appearance: Normal appearance.  Cardiovascular:     Rate and Rhythm: Normal rate and regular rhythm.     Pulses: Normal pulses.     Heart sounds: Normal heart sounds.  Pulmonary:     Effort: Pulmonary  effort is normal.     Breath sounds: Normal breath sounds.  Chest:  Breasts:     Right: No axillary adenopathy or supraclavicular adenopathy.     Left: No axillary adenopathy or supraclavicular adenopathy.      Comments: Port-a-Cath in L chest Abdominal:     Palpations: Abdomen is soft. There is no hepatomegaly, splenomegaly or mass.     Tenderness: There is no abdominal tenderness.  Musculoskeletal:     Right upper leg: Edema (more than L) present.     Left upper leg: Edema present.     Right lower leg: Edema present.     Left lower leg: Edema present.  Lymphadenopathy:     Cervical: No cervical adenopathy.     Upper Body:     Right upper body: No supraclavicular, axillary or pectoral adenopathy.     Left upper body: No supraclavicular, axillary or pectoral adenopathy.  Neurological:     General: No focal deficit present.     Mental Status: She is alert and oriented to person, place, and time.  Psychiatric:        Mood and Affect: Mood normal.        Behavior: Behavior normal.     LABORATORY DATA:  I have reviewed the labs as listed.  CBC Latest Ref Rng & Units 09/09/2020 09/04/2020 08/15/2020  WBC 4.0 - 10.5 K/uL 9.0 7.3 7.8  Hemoglobin 12.0 - 15.0 g/dL 10.1(L) 10.7(L) 10.5(L)  Hematocrit 36.0 - 46.0 % 31.1(L)  32.4(L) 33.3(L)  Platelets 150 - 400 K/uL 168 166 222   CMP Latest Ref Rng & Units 09/09/2020 08/15/2020 08/01/2015  Glucose 70 - 99 mg/dL 115(H) 99 90  BUN 8 - 23 mg/dL $Remove'11 8 6  'JydzEmH$ Creatinine 0.44 - 1.00 mg/dL 0.75 0.83 0.94  Sodium 135 - 145 mmol/L 128(L) 134(L) 140  Potassium 3.5 - 5.1 mmol/L 3.3(L) 4.3 3.3(L)  Chloride 98 - 111 mmol/L 91(L) 95(L) 100(L)  CO2 22 - 32 mmol/L $RemoveB'28 29 28  'qaBroRAp$ Calcium 8.9 - 10.3 mg/dL 8.3(L) 9.0 9.6  Total Protein 6.5 - 8.1 g/dL 6.1(L) 7.0 7.0  Total Bilirubin 0.3 - 1.2 mg/dL 0.8 0.8 0.6  Alkaline Phos 38 - 126 U/L 97 122 91  AST 15 - 41 U/L $Remo'18 16 26  'LomdS$ ALT 0 - 44 U/L 7 9 13(L)    DIAGNOSTIC IMAGING:  I have independently reviewed the scans and discussed with the patient. NM PET Image Initial (PI) Skull Base To Thigh  Result Date: 08/27/2020 CLINICAL DATA:  Initial treatment strategy for diffuse lymphadenopathy. EXAM: NUCLEAR MEDICINE PET SKULL BASE TO THIGH TECHNIQUE: 7.1 mCi F-18 FDG was injected intravenously. Full-ring PET imaging was performed from the skull base to thigh after the radiotracer. CT data was obtained and used for attenuation correction and anatomic localization. Fasting blood glucose: 69 mg/dl COMPARISON:  CT abdomen/pelvis dated 07/31/2020 FINDINGS: Mediastinal blood pool activity: SUV max 1.9 Liver activity: SUV max 2.4 NECK: Small bilateral supraclavicular nodes, measuring up to 10 mm short axis on the right (series 3/image 69), max SUV 20.5. Incidental CT findings: none CHEST: Mediastinal lymphadenopathy, including: --18 mm short axis AP window node (series 3/image 96), max SUV 13.7 --11 mm short axis prevascular node (series 3/image 103), max SUV 18.0 --10 mm short axis low right pretracheal node (series 3/image 103), max SUV 6.4 Bilateral subpectoral/axillary lymphadenopathy, including a 7 mm short axis left chest wall node (series 3/image 86) with max SUV 7.2 and a 6 mm short axis right  chest wall node (series 3/image 88) with max SUV 6.1. No  suspicious pulmonary nodules. Incidental CT findings: Mild atherosclerotic calcifications of the aortic arch. ABDOMEN/PELVIS: Retroperitoneal/pelvic lymphadenopathy, including: --11 mm short axis aortocaval node (series 3/image 185), max SUV 10.2 --10 mm short axis right common iliac node (series 3/image 206), max SUV 11.5 --16 mm short axis bilateral pelvic sidewall nodes (series 3/images 254-255), max SUV 12.4 on the left and 19.2 on the right --Bilateral inguinal lymphadenopathy, right greater than left, including a dominant 19 mm short axis right inguinal node (series 3/image 268), max SUV 18.5 Spleen is normal in size, without focal lesion. Reference splenic hypermetabolism 3.7. No abnormal metabolism in the liver, spleen, pancreas, or adrenal glands. Incidental CT findings: Bladder prolapse. Associated moderate left hydroureteronephrosis with dilatation of the left ureter likely to the prolapsed pelvic floor (series 3/image 275). Superimposed large left extrarenal pelvis (series 3/image 189). Tiny hiatal hernia. Atherosclerotic calcifications the abdominal aorta. Left colonic diverticulosis, without evidence of diverticulitis. SKELETON: No focal hypermetabolic activity to suggest skeletal metastasis. Incidental CT findings: Mild degenerative changes of the visualized thoracolumbar spine. Subcutaneous edema in the lower pelvis and bilateral thighs. Suspected postsurgical changes with soft tissue gas in the left inguinal region. IMPRESSION: Supraclavicular, thoracic, and abdominopelvic lymphadenopathy, including dominant right inguinal nodes, as above. This appearance is suspicious for lymphoma. Correlate with pending surgical pathology results. Spleen is normal in size, without focal lesion. Bladder prolapse with associated moderate left hydroureteronephrosis and superimposed large extrarenal pelvis. Electronically Signed   By: Julian Hy M.D.   On: 08/27/2020 11:05   DG Chest Port 1 View  Result  Date: 09/06/2020 CLINICAL DATA:  Status post Port-A-Cath placement. EXAM: PORTABLE CHEST 1 VIEW COMPARISON:  None. FINDINGS: Left subclavian approach Port-A-Cath is in place with its tip projecting in the mid to lower superior vena cava. No pneumothorax. Lungs are clear. Cardiomegaly. Remote left rib fractures noted. IMPRESSION: Port-A-Cath tip projects in the mid to lower superior vena cava. No pneumothorax or acute disease. Electronically Signed   By: Inge Rise M.D.   On: 09/06/2020 11:20   DG C-Arm 1-60 Min-No Report  Result Date: 09/06/2020 Fluoroscopy was utilized by the requesting physician.  No radiographic interpretation.   ECHOCARDIOGRAM COMPLETE  Result Date: 09/05/2020    ECHOCARDIOGRAM REPORT   Patient Name:   Erica Mora Date of Exam: 09/05/2020 Medical Rec #:  [id number]        Height:       60.0 in Accession #:    [id number]       Weight:       114.1 lb Date of Birth:  [birth year]        BSA:          1.471 m Patient Age:    80 years         BP:           153/77 mmHg Patient Gender: F                HR:           92 bpm. Exam Location:  [facility] Procedure: 2D Echo, Cardiac Doppler and Color Doppler Indications:    Chemo Z09  History:        Patient has no prior history of Echocardiogram examinations.                 Diffuse large B-cell lymphoma of lymph nodes of multiple  regions.  Sonographer:    Alvino Chapel RCS Referring Phys: [phone] [place]  1. Left ventricular ejection fraction, by estimation, is 70 to 75%. The left ventricle has hyperdynamic function. The left ventricle has no regional wall motion abnormalities. Left ventricular diastolic parameters are consistent with Grade I diastolic dysfunction (impaired relaxation).  2. Right ventricular systolic function is normal. The right ventricular size is normal. There is normal pulmonary artery systolic pressure.  3. Left atrial size was mildly dilated.  4. The mitral valve is normal in  structure. Mild mitral valve regurgitation.  5. The aortic valve is tricuspid. Aortic valve regurgitation is mild. Mild aortic valve sclerosis is present, with no evidence of aortic valve stenosis.  6. The inferior vena cava is normal in size with greater than 50% respiratory variability, suggesting right atrial pressure of 3 mmHg. FINDINGS  Left Ventricle: Left ventricular ejection fraction, by estimation, is 70 to 75%. The left ventricle has hyperdynamic function. The left ventricle has no regional wall motion abnormalities. The left ventricular internal cavity size was normal in size. There is no left ventricular hypertrophy. Left ventricular diastolic parameters are consistent with Grade I diastolic dysfunction (impaired relaxation). Right Ventricle: The right ventricular size is normal. No increase in right ventricular wall thickness. Right ventricular systolic function is normal. There is normal pulmonary artery systolic pressure. The tricuspid regurgitant velocity is 2.74 m/s, and  with an assumed right atrial pressure of 3 mmHg, the estimated right ventricular systolic pressure is 62.2 mmHg. Left Atrium: Left atrial size was mildly dilated. Right Atrium: Right atrial size was normal in size. Pericardium: There is no evidence of pericardial effusion. Mitral Valve: The mitral valve is normal in structure. Mild mitral valve regurgitation. Tricuspid Valve: The tricuspid valve is normal in structure. Tricuspid valve regurgitation is mild. Aortic Valve: The aortic valve is tricuspid. Aortic valve regurgitation is mild. Mild aortic valve sclerosis is present, with no evidence of aortic valve stenosis. Pulmonic Valve: The pulmonic valve was not well visualized. Pulmonic valve regurgitation is not visualized. Aorta: The aortic root is normal in size and structure. Venous: The inferior vena cava is normal in size with greater than 50% respiratory variability, suggesting right atrial pressure of 3 mmHg. IAS/Shunts: The  interatrial septum was not assessed.  LEFT VENTRICLE PLAX 2D LVIDd:         4.50 cm  Diastology LVIDs:         2.60 cm  LV e' medial:    5.44 cm/s LV PW:         1.00 cm  LV E/e' medial:  14.0 LV IVS:        0.90 cm  LV e' lateral:   4.68 cm/s LVOT diam:     1.70 cm  LV E/e' lateral: 16.3 LV SV:         67 LV SV Index:   46 LVOT Area:     2.27 cm  RIGHT VENTRICLE RV S prime:     15.40 cm/s TAPSE (M-mode): 2.5 cm LEFT ATRIUM             Index       RIGHT ATRIUM           Index LA diam:        3.00 cm 2.04 cm/m  RA Area:     12.00 cm LA Vol (A2C):   32.7 ml 22.24 ml/m RA Volume:   25.30 ml  17.20 ml/m LA Vol (A4C):   49.0  ml 33.32 ml/m LA Biplane Vol: 40.5 ml 27.54 ml/m  AORTIC VALVE LVOT Vmax:   147.00 cm/s LVOT Vmean:  90.400 cm/s LVOT VTI:    0.295 m  AORTA Ao Root diam: 3.00 cm MITRAL VALVE                TRICUSPID VALVE MV Area (PHT): 2.62 cm     TR Peak grad:   30.0 mmHg MV Decel Time: 289 msec     TR Vmax:        274.00 cm/s MV E velocity: 76.30 cm/s MV A velocity: 115.00 cm/s  SHUNTS MV E/A ratio:  0.66         Systemic VTI:  0.30 m                             Systemic Diam: 1.70 cm Dorris Carnes MD Electronically signed by Dorris Carnes MD Signature Date/Time: 09/05/2020/3:17:50 PM    Final      ASSESSMENT:  1. Diffuse lymphadenopathy in retroperitoneal, pelvic and inguinal regions: -Presentation with leg swelling startingend of October 2021. -Evaluated at urgent care on 05/15/2020 with leg Doppler-no DVT but showed enlarged lymph nodes in the right groin. -Was evaluated by Dr. Willey Blade and a CT scan [facility] was ordered. -CTAP on 07/31/2020 showed retroperitoneal, pelvic and inguinal adenopathy with mild compression fracture deformity of L1 age-indeterminate. Moderate left hydroureteronephrosis with prominent dilatation of the left renal pelvis. -PET scan on 08/26/2020 shows supraclavicular, thoracic, abdominal pelvic lymphadenopathy including dominant right inguinal lymph nodes.  Spleen is normal without  focal lesion.  No focal hypermetabolic activity to suggest bone mets.  Bladder prolapse with associated moderate left hydroureteronephrosis and superimposed large extrarenal pelvis. 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 -Bone marrow biopsy on 09/04/2020 - for lymphomatous involvement. -2D echo on 09/05/2020 with EF 70-75%.  2. Social/family history: -She worked as a Art therapist prior to retirement. She was a never smoker. -She drinks wine occasionally. -Sister died of ovarian cancer at age 84. Mother died of possible pancreatic cancer.   PLAN:  1.  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 -We talked about initiating her on first cycle of R-CHOP. -Because of her age, I will dose reduce on Adriamycin, cyclophosphamide and vincristine. -We reviewed her labs which showed normal CBC.  LFTs were normal although low albumin.  Encouraged high-protein diet. -Mild hypokalemia was corrected. -She reported itching of lower back and left shoulder which will likely improve with treatment.  She also has bilateral lower extremity lymphedema, right more than left from blockage by the lymph nodes. -She will proceed with her first cycle of R-CHOP today.  I plan to reevaluate her in 1 week with tumor lysis labs.  2. Generalized itching: -Continue hydroxyzine 10 mg 3 times a day as needed.  3. Left hydroureteronephrosis: -She will have subsequent follow-up with urology for bladder prolapse and associated left hydroureteronephrosis.  4.  TLS prophylaxis: -Continue allopurinol 300 mg daily. -She will receive rasburicase 3 mg today.  5.  Anxiety: -Continue Xanax 0.25 mg half tablet at bedtime.   Orders placed this encounter:  No orders of the defined types were placed in this  encounter.    Derek Jack, MD [place] [phone]   I, Quillian Quince  Khash[place], am acting as a scribe for Dr. Sanda Linger.  I, Derek Jack MD, have reviewed the above documentation for accuracy and completeness, and I agree with the above.

## 2020-09-11 ENCOUNTER — Other Ambulatory Visit: Payer: Self-pay

## 2020-09-11 ENCOUNTER — Inpatient Hospital Stay (HOSPITAL_COMMUNITY): Payer: Medicare Other

## 2020-09-11 ENCOUNTER — Encounter (HOSPITAL_COMMUNITY): Payer: Self-pay

## 2020-09-11 VITALS — HR 75 | Temp 97.6°F | Resp 18

## 2020-09-11 DIAGNOSIS — Z5112 Encounter for antineoplastic immunotherapy: Secondary | ICD-10-CM | POA: Diagnosis not present

## 2020-09-11 DIAGNOSIS — C8330 Diffuse large B-cell lymphoma, unspecified site: Secondary | ICD-10-CM

## 2020-09-11 DIAGNOSIS — Z95828 Presence of other vascular implants and grafts: Secondary | ICD-10-CM

## 2020-09-11 LAB — SURGICAL PATHOLOGY

## 2020-09-11 MED ORDER — PEGFILGRASTIM-JMDB 6 MG/0.6ML ~~LOC~~ SOSY
6.0000 mg | PREFILLED_SYRINGE | Freq: Once | SUBCUTANEOUS | Status: AC
  Administered 2020-09-11: 6 mg via SUBCUTANEOUS
  Filled 2020-09-11: qty 0.6

## 2020-09-11 NOTE — Patient Instructions (Signed)
[facility] Discharge Instructions for Patients Receiving Chemotherapy  Pegfilgrastim injection What is this medicine? PEGFILGRASTIM (PEG fil gra stim) is a long-acting granulocyte colony-stimulating factor that stimulates the growth of neutrophils, a type of white blood cell important in the body's fight against infection. It is used to reduce the incidence of fever and infection in patients with certain types of cancer who are receiving chemotherapy that affects the bone marrow, and to increase survival after being exposed to high doses of radiation. This medicine may be used for other purposes; ask your health care provider or pharmacist if you have questions. COMMON BRAND NAME(S): Rexene Edison, Ziextenzo What should I tell my health care provider before I take this medicine? They need to know if you have any of these conditions:  kidney disease  latex allergy  ongoing radiation therapy  sickle cell disease  skin reactions to acrylic adhesives (On-Body Injector only)  an unusual or allergic reaction to pegfilgrastim, filgrastim, other medicines, foods, dyes, or preservatives  pregnant or trying to get pregnant  breast-feeding How should I use this medicine? This medicine is for injection under the skin. If you get this medicine at home, you will be taught how to prepare and give the pre-filled syringe or how to use the On-body Injector. Refer to the patient Instructions for Use for detailed instructions. Use exactly as directed. Tell your healthcare provider immediately if you suspect that the On-body Injector may not have performed as intended or if you suspect the use of the On-body Injector resulted in a missed or partial dose. It is important that you put your used needles and syringes in a special sharps container. Do not put them in a trash can. If you do not have a sharps container, call your pharmacist or healthcare provider to get  one. Talk to your pediatrician regarding the use of this medicine in children. While this drug may be prescribed for selected conditions, precautions do apply. Overdosage: If you think you have taken too much of this medicine contact a poison control center or emergency room at once. NOTE: This medicine is only for you. Do not share this medicine with others. What if I miss a dose? It is important not to miss your dose. Call your doctor or health care professional if you miss your dose. If you miss a dose due to an On-body Injector failure or leakage, a new dose should be administered as soon as possible using a single prefilled syringe for manual use. What may interact with this medicine? Interactions have not been studied. This list may not describe all possible interactions. Give your health care provider a list of all the medicines, herbs, non-prescription drugs, or dietary supplements you use. Also tell them if you smoke, drink alcohol, or use illegal drugs. Some items may interact with your medicine. What should I watch for while using this medicine? Your condition will be monitored carefully while you are receiving this medicine. You may need blood work done while you are taking this medicine. Talk to your health care provider about your risk of cancer. You may be more at risk for certain types of cancer if you take this medicine. If you are going to need a MRI, CT scan, or other procedure, tell your doctor that you are using this medicine (On-Body Injector only). What side effects may I notice from receiving this medicine? Side effects that you should report to your doctor or health care professional as soon as  possible:  allergic reactions (skin rash, itching or hives, swelling of the face, lips, or tongue)  back pain  dizziness  fever  pain, redness, or irritation at site where injected  pinpoint red spots on the skin  red or dark-brown urine  shortness of breath or breathing  problems  stomach or side pain, or pain at the shoulder  swelling  tiredness  trouble passing urine or change in the amount of urine  unusual bruising or bleeding Side effects that usually do not require medical attention (report to your doctor or health care professional if they continue or are bothersome):  bone pain  muscle pain This list may not describe all possible side effects. Call your doctor for medical advice about side effects. You may report side effects to FDA at 1-800-FDA-1088. Where should I keep my medicine? Keep out of the reach of children. If you are using this medicine at home, you will be instructed on how to store it. Throw away any unused medicine after the expiration date on the label. NOTE: This sheet is a summary. It may not cover all possible information. If you have questions about this medicine, talk to your doctor, pharmacist, or health care provider.  2021 [employer] Standard (2019-06-23 13:20:51)    If you develop nausea and vomiting that is not controlled by your nausea medication, call the clinic.   BELOW ARE SYMPTOMS THAT SHOULD BE REPORTED IMMEDIATELY:  *FEVER GREATER THAN 100.5 F  *CHILLS WITH OR WITHOUT FEVER  NAUSEA AND VOMITING THAT IS NOT CONTROLLED WITH YOUR NAUSEA MEDICATION  *UNUSUAL SHORTNESS OF BREATH  *UNUSUAL BRUISING OR BLEEDING  TENDERNESS IN MOUTH AND THROAT WITH OR WITHOUT PRESENCE OF ULCERS  *URINARY PROBLEMS  *BOWEL PROBLEMS  UNUSUAL RASH Items with * indicate a potential emergency and should be followed up as soon as possible.  Feel free to call the clinic should you have any questions or concerns. The clinic phone number is [phone].  Please show the [facility] at check-in to the Emergency Department and triage nurse.

## 2020-09-11 NOTE — Progress Notes (Signed)
Chemotherapy 24 hour follow up.  Patient denied any problems with chemotherapy on Monday.  For today's visit the patient stated she had "splotches" on her chest the day of treatment but cleared by Tuesday.  Denied pain, SOB, dysuria, and fevers.  Patient stated her itching has stopped.  Patient assessed for rash.  Back, abdomen, arms, and chest clear with no rash present.  Bilateral leg swelling still present with a decrease in redness with bilateral legs.  No s/s of distress noted.    Patient tolerated injection with no complaints voiced.  Site clean and dry with no bruising or swelling noted at site.  See MAR for details.  Band aid applied.  Patient stable during and after injection.  Vss with discharge and left in satisfactory condition with no s/s of distress noted.

## 2020-09-12 ENCOUNTER — Encounter (HOSPITAL_COMMUNITY): Payer: Self-pay

## 2020-09-16 ENCOUNTER — Other Ambulatory Visit: Payer: Self-pay

## 2020-09-16 ENCOUNTER — Inpatient Hospital Stay (HOSPITAL_COMMUNITY): Payer: Medicare Other | Attending: Hematology

## 2020-09-16 ENCOUNTER — Encounter (HOSPITAL_COMMUNITY): Payer: Self-pay | Admitting: Hematology

## 2020-09-16 ENCOUNTER — Inpatient Hospital Stay (HOSPITAL_BASED_OUTPATIENT_CLINIC_OR_DEPARTMENT_OTHER): Payer: Medicare Other | Admitting: Hematology

## 2020-09-16 VITALS — BP 152/63 | HR 78 | Temp 96.9°F | Resp 18 | Wt 117.8 lb

## 2020-09-16 DIAGNOSIS — Z5189 Encounter for other specified aftercare: Secondary | ICD-10-CM | POA: Insufficient documentation

## 2020-09-16 DIAGNOSIS — E876 Hypokalemia: Secondary | ICD-10-CM | POA: Diagnosis not present

## 2020-09-16 DIAGNOSIS — Z5111 Encounter for antineoplastic chemotherapy: Secondary | ICD-10-CM | POA: Diagnosis present

## 2020-09-16 DIAGNOSIS — Z5112 Encounter for antineoplastic immunotherapy: Secondary | ICD-10-CM | POA: Diagnosis present

## 2020-09-16 DIAGNOSIS — F419 Anxiety disorder, unspecified: Secondary | ICD-10-CM | POA: Diagnosis not present

## 2020-09-16 DIAGNOSIS — C8338 Diffuse large B-cell lymphoma, lymph nodes of multiple sites: Secondary | ICD-10-CM | POA: Insufficient documentation

## 2020-09-16 DIAGNOSIS — C8330 Diffuse large B-cell lymphoma, unspecified site: Secondary | ICD-10-CM

## 2020-09-16 DIAGNOSIS — N1330 Unspecified hydronephrosis: Secondary | ICD-10-CM | POA: Insufficient documentation

## 2020-09-16 LAB — COMPREHENSIVE METABOLIC PANEL
ALT: 11 U/L (ref 0–44)
AST: 13 U/L — ABNORMAL LOW (ref 15–41)
Albumin: 2.8 g/dL — ABNORMAL LOW (ref 3.5–5.0)
Alkaline Phosphatase: 79 U/L (ref 38–126)
Anion gap: 9 (ref 5–15)
BUN: 12 mg/dL (ref 8–23)
CO2: 29 mmol/L (ref 22–32)
Calcium: 8.2 mg/dL — ABNORMAL LOW (ref 8.9–10.3)
Chloride: 93 mmol/L — ABNORMAL LOW (ref 98–111)
Creatinine, Ser: 0.64 mg/dL (ref 0.44–1.00)
GFR, Estimated: >60 mL/min (ref >60)
Glucose, Bld: 127 mg/dL — ABNORMAL HIGH (ref 70–99)
Potassium: 2.8 mmol/L — ABNORMAL LOW (ref 3.5–5.1)
Sodium: 131 mmol/L — ABNORMAL LOW (ref 135–145)
Total Bilirubin: 0.4 mg/dL (ref 0.3–1.2)
Total Protein: 5.8 g/dL — ABNORMAL LOW (ref 6.5–8.1)

## 2020-09-16 LAB — MAGNESIUM: Magnesium: 1.8 mg/dL (ref 1.7–2.4)

## 2020-09-16 LAB — CBC WITH DIFFERENTIAL/PLATELET
Abs Immature Granulocytes: 0 10*3/uL (ref 0.00–0.07)
Basophils Absolute: 0 10*3/uL (ref 0.0–0.1)
Basophils Relative: 2 %
Eosinophils Absolute: 0.1 10*3/uL (ref 0.0–0.5)
Eosinophils Relative: 20 %
HCT: 29.5 % — ABNORMAL LOW (ref 36.0–46.0)
Hemoglobin: 9.5 g/dL — ABNORMAL LOW (ref 12.0–15.0)
Immature Granulocytes: 0 %
Lymphocytes Relative: 25 %
Lymphs Abs: 0.2 10*3/uL — ABNORMAL LOW (ref 0.7–4.0)
MCH: 27.6 pg (ref 26.0–34.0)
MCHC: 32.2 g/dL (ref 30.0–36.0)
MCV: 85.8 fL (ref 80.0–100.0)
Monocytes Absolute: 0.1 10*3/uL (ref 0.1–1.0)
Monocytes Relative: 12 %
Neutro Abs: 0.2 10*3/uL — CL (ref 1.7–7.7)
Neutrophils Relative %: 41 %
Platelets: 115 10*3/uL — ABNORMAL LOW (ref 150–400)
RBC: 3.44 MIL/uL — ABNORMAL LOW (ref 3.87–5.11)
RDW: 13.7 % (ref 11.5–15.5)
WBC Morphology: INCREASED
WBC: 0.6 10*3/uL — CL (ref 4.0–10.5)
nRBC: 0 % (ref 0.0–0.2)

## 2020-09-16 LAB — URIC ACID: Uric Acid, Serum: 1.7 mg/dL — ABNORMAL LOW (ref 2.5–7.1)

## 2020-09-16 LAB — PHOSPHORUS: Phosphorus: 2.7 mg/dL (ref 2.5–4.6)

## 2020-09-16 MED ORDER — POTASSIUM CHLORIDE ER 10 MEQ PO TBCR: 10.0000 meq | EXTENDED_RELEASE_TABLET | Freq: Every day | ORAL | 1 refills | Status: AC

## 2020-09-16 MED ORDER — POTASSIUM CHLORIDE CRYS ER 10 MEQ PO TBCR
40.0000 meq | EXTENDED_RELEASE_TABLET | Freq: Once | ORAL | Status: AC
  Administered 2020-09-16: 40 meq via ORAL

## 2020-09-16 NOTE — Progress Notes (Signed)
CRITICAL VALUE STICKER  CRITICAL VALUE: WBC 0.59  RECEIVER (on-site recipient of call): Charlyne Petrin, Rn  DATE & TIME NOTIFIED: 09/16/2020 at 1438  MESSENGER (representative from lab):Toma Copier  MD NOTIFIED: Delton Coombes  TIME OF NOTIFICATION:1438  RESPONSE: Oncologist aware with no orders received.

## 2020-09-16 NOTE — Progress Notes (Signed)
Dr. Delton Coombes is ordering Potassium 40 meq in office today. Potassium 10 meq take 4 tablets once this evening and 2 tablets daily for home.

## 2020-09-16 NOTE — Progress Notes (Signed)
[place], [place] [address]   CLINIC:  Medical Oncology/Hematology  PCP:  Asencion Noble, MD [address] / [place] [address]  [phone]  REASON FOR VISIT:  Follow-up for DLBCL  PRIOR THERAPY: Not done  CURRENT THERAPY: R-CHOP every 3 weeks  INTERVAL HISTORY:  [name], a 80 y.o. female, returns for routine follow-up for her DLBCL. Erica Mora was last seen on 09/09/2020.  Today she reports feeling okay. She complains of having sore throat since getting her Fulphila injection on 03/30, but denies having new bone pains. She tolerated the previous treatment well and denies having N/V/D, numbness or tingling. She continues having swelling in her bilateral thighs and feet. She notes that her appetite is decreased, which is chronic.   REVIEW OF SYSTEMS:  Review of Systems  Constitutional: Positive for appetite change (50%) and fatigue (50%).  HENT:   Positive for sore throat (mild since 3/30).   Cardiovascular: Positive for leg swelling (in feet & thighs).  Gastrointestinal: Positive for constipation. Negative for diarrhea, nausea and vomiting.  Musculoskeletal: Negative for arthralgias.  Neurological: Negative for numbness.  All other systems reviewed and are negative.   PAST MEDICAL/SURGICAL HISTORY:  Past Medical History:  Diagnosis Date  . Port-A-Cath in place 09/03/2020  . Prolapse of female bladder, acquired 1973   Past Surgical History:  Procedure Laterality Date  . EYE SURGERY     cataracts- bilateral    . INGUINAL LYMPH NODE BIOPSY Left 08/23/2020   Procedure: INGUINAL LYMPH NODE BIOPSY, LEFT;  Surgeon: Virl Cagey, MD;  Location: [facility];  Service: General;  Laterality: Left;  . ORIF HUMERUS FRACTURE Left 08/02/2015   Procedure: OPEN REDUCTION INTERNAL FIXATION (ORIF)LEFT DISTAL RADIUS,HUMERUS FRACTURE;  Surgeon: Altamese [place], MD;  Location: [facility];  Service: Orthopedics;  Laterality: Left;  .  PORTACATH PLACEMENT Left 09/06/2020   Procedure: INSERTION PORT-A-CATH;  Surgeon: Virl Cagey, MD;  Location: [facility];  Service: General;  Laterality: Left;  . TUBAL LIGATION    . VAGINAL DELIVERY      SOCIAL HISTORY:  Social History   Socioeconomic History  . Marital status: Married    Spouse name: Not on file  . Number of children: Not on file  . Years of education: Not on file  . Highest education level: Not on file  Occupational History  . Not on file  Tobacco Use  . Smoking status: Never Smoker  . Smokeless tobacco: Never Used  Vaping Use  . Vaping Use: Never used  Substance and Sexual Activity  . Alcohol use: Yes    Comment: wine glasses per day - 2 per day  . Drug use: No  . Sexual activity: Not Currently  Other Topics Concern  . Not on file  Social History Narrative  . Not on file   Social Determinants of Health   Financial Resource Strain: Low Risk   . Difficulty of Paying Living Expenses: Not hard at all  Food Insecurity: No Food Insecurity  . Worried About Charity fundraiser in the Last Year: Never true  . Ran Out of Food in the Last Year: Never true  Transportation Needs: No Transportation Needs  . Lack of Transportation (Medical): No  . Lack of Transportation (Non-Medical): No  Physical Activity: Inactive  . Days of Exercise per Week: 0 days  . Minutes of Exercise per Session: 0 min  Stress: No Stress Concern Present  . Feeling of Stress :  Only a little  Social Connections: Moderately Integrated  . Frequency of Communication with Friends and Family: More than three times a week  . Frequency of Social Gatherings with Friends and Family: More than three times a week  . Attends Religious Services: More than 4 times per year  . Active Member of Clubs or Organizations: No  . Attends Archivist Meetings: Never  . Marital Status: Married  Human resources officer Violence: Not At Risk  . Fear of Current or Ex-Partner: No  . Emotionally Abused: No   . Physically Abused: No  . Sexually Abused: No    FAMILY HISTORY:  History reviewed. No pertinent family history.  CURRENT MEDICATIONS:  Current Outpatient Medications  Medication Sig Dispense Refill  . potassium chloride (KLOR-CON) 10 MEQ tablet Take 1 tablet (10 mEq total) by mouth daily. Take 2 tablets by mouth once daily. 60 tablet 1  . allopurinol (ZYLOPRIM) 300 MG tablet Take 1 tablet (300 mg total) by mouth daily. 30 tablet 2  . ALPRAZolam (XANAX) 0.25 MG tablet Take 0.5 tablets (0.125 mg total) by mouth at bedtime as needed for anxiety. 30 tablet 0  . CYCLOPHOSPHAMIDE IV Inject into the vein every 21 ( twenty-one) days.    Marland Kitchen DOXORUBICIN HCL IV Inject into the vein every 21 ( twenty-one) days.    . Doxylamine Succinate, Sleep, (SLEEP AID PO) Take 1 tablet by mouth at bedtime.    . hydrOXYzine (ATARAX/VISTARIL) 10 MG tablet Take 10 mg by mouth 3 (three) times daily as needed for anxiety or itching.    . lidocaine-prilocaine (EMLA) cream Apply a small amount to port a cath site and cover with plastic wrap 1 hour prior to infusion appointments. 30 g 3  . naproxen sodium (ALEVE) 220 MG tablet Take 110 mg by mouth 2 (two) times daily as needed (pain).    . ondansetron (ZOFRAN) 4 MG tablet Take 1 tablet (4 mg total) by mouth every 8 (eight) hours as needed. 30 tablet 1  . oxyCODONE (ROXICODONE) 5 MG immediate release tablet Take 1 tablet (5 mg total) by mouth every 4 (four) hours as needed for severe pain or breakthrough pain. 5 tablet 0  . predniSONE (DELTASONE) 20 MG tablet Take 5 tablets (100 mg) by mouth with breakfast daily on Days 1-5 of chemotherapy treatments 150 tablet 0  . prochlorperazine (COMPAZINE) 10 MG tablet Take 1 tablet (10 mg total) by mouth every 6 (six) hours as needed (Nausea or vomiting). 30 tablet 6  . RITUXIMAB IV Inject into the vein every 21 ( twenty-one) days.    Marland Kitchen VINCRISTINE SULFATE IV Inject into the vein every 21 ( twenty-one) days.     Current  Facility-Administered Medications  Medication Dose Route Frequency Provider Last Rate Last Admin  . potassium chloride SA (KLOR-CON) CR tablet 40 mEq  40 mEq Oral Once Derek Jack, MD        ALLERGIES:  No Known Allergies  PHYSICAL EXAM:  Performance status (ECOG): 1 - Symptomatic but completely ambulatory  Vitals:   09/16/20 1318  BP: (!) 152/63  Pulse: 78  Resp: 18  Temp: (!) 96.9 F (36.1 C)  SpO2: 100%   Wt Readings from Last 3 Encounters:  09/16/20 117 lb 12.8 oz (53.4 kg)  09/09/20 116 lb 6.4 oz (52.8 kg)  09/05/20 115 lb (52.2 kg)   Physical Exam Vitals reviewed.  Constitutional:      Appearance: Normal appearance.  HENT:     Mouth/Throat:  Lips: No lesions.     Mouth: No oral lesions.     Dentition: No gum lesions.     Tongue: No lesions.     Palate: No mass.     Pharynx: No posterior oropharyngeal erythema.     Tonsils: No tonsillar exudate.  Cardiovascular:     Rate and Rhythm: Normal rate and regular rhythm.     Pulses: Normal pulses.     Heart sounds: Normal heart sounds.  Pulmonary:     Effort: Pulmonary effort is normal.     Breath sounds: Normal breath sounds.  Chest:     Comments: Port-a-Cath in L chest Musculoskeletal:     Right lower leg: Edema (chronic lymphedema) present.     Left lower leg: Edema (chronic lymphedema) present.  Neurological:     General: No focal deficit present.     Mental Status: She is alert and oriented to person, place, and time.  Psychiatric:        Mood and Affect: Mood normal.        Behavior: Behavior normal.     LABORATORY DATA:  I have reviewed the labs as listed.  CBC Latest Ref Rng & Units 09/16/2020 09/09/2020 09/04/2020  WBC 4.0 - 10.5 K/uL 0.6(LL) 9.0 7.3  Hemoglobin 12.0 - 15.0 g/dL 9.5(L) 10.1(L) 10.7(L)  Hematocrit 36.0 - 46.0 % 29.5(L) 31.1(L) 32.4(L)  Platelets 150 - 400 K/uL 115(L) 168 166   CMP Latest Ref Rng & Units 09/16/2020 09/09/2020 08/15/2020  Glucose 70 - 99 mg/dL 127(H) 115(H)  99  BUN 8 - 23 mg/dL _0 Creatinine 0.44 - 1.00 mg/dL 0.64 0.75 0.83  Sodium 135 - 145 mmol/L 131(L) 128(L) 134(L)  Potassium 3.5 - 5.1 mmol/L 2.8(L) 3.3(L) 4.3  Chloride 98 - 111 mmol/L 93(L) 91(L) 95(L)  CO2 22 - 32 mmol/L _1 Calcium 8.9 - 10.3 mg/dL 8.2(L) 8.3(L) 9.0  Total Protein 6.5 - 8.1 g/dL 5.8(L) 6.1(L) 7.0  Total Bilirubin 0.3 - 1.2 mg/dL 0.4 0.8 0.8  Alkaline Phos 38 - 126 U/L 79 97 122  AST 15 - 41 U/L 13(L) 18 16  ALT 0 - 44 U/L _2 Component Value Date/Time   RBC 3.44 (L) 09/16/2020 1223   MCV 85.8 09/16/2020 1223   MCH 27.6 09/16/2020 1223   MCHC 32.2 09/16/2020 1223   RDW 13.7 09/16/2020 1223   LYMPHSABS 0.2 (L) 09/16/2020 1223   MONOABS 0.1 09/16/2020 1223   EOSABS 0.1 09/16/2020 1223   BASOSABS 0.0 09/16/2020 1223    DIAGNOSTIC IMAGING:  I have independently reviewed the scans and discussed with the patient. NM PET Image Initial (PI) Skull Base To Thigh  Result Date: 08/27/2020 CLINICAL DATA:  Initial treatment strategy for diffuse lymphadenopathy. EXAM: NUCLEAR MEDICINE PET SKULL BASE TO THIGH TECHNIQUE: 7.1 mCi F-18 FDG was injected intravenously. Full-ring PET imaging was performed from the skull base to thigh after the radiotracer. CT data was obtained and used for attenuation correction and anatomic localization. Fasting blood glucose: 69 mg/dl COMPARISON:  CT abdomen/pelvis dated 07/31/2020 FINDINGS: Mediastinal blood pool activity: SUV max 1.9 Liver activity: SUV max 2.4 NECK: Small bilateral supraclavicular nodes, measuring up to 10 mm short axis on the right (series 3/image 69), max SUV 20.5. Incidental CT findings: none CHEST: Mediastinal lymphadenopathy, including: --18 mm short axis AP window node (series 3/image 96), max SUV 13.7 --11 mm short axis prevascular node (series 3/image 103), max SUV 18.0 --  10 mm short axis low right pretracheal node (series 3/image 103), max SUV 6.4 Bilateral subpectoral/axillary lymphadenopathy, including  a 7 mm short axis left chest wall node (series 3/image 86) with max SUV 7.2 and a 6 mm short axis right chest wall node (series 3/image 88) with max SUV 6.1. No suspicious pulmonary nodules. Incidental CT findings: Mild atherosclerotic calcifications of the aortic arch. ABDOMEN/PELVIS: Retroperitoneal/pelvic lymphadenopathy, including: --11 mm short axis aortocaval node (series 3/image 185), max SUV 10.2 --10 mm short axis right common iliac node (series 3/image 206), max SUV 11.5 --16 mm short axis bilateral pelvic sidewall nodes (series 3/images 254-255), max SUV 12.4 on the left and 19.2 on the right --Bilateral inguinal lymphadenopathy, right greater than left, including a dominant 19 mm short axis right inguinal node (series 3/image 268), max SUV 18.5 Spleen is normal in size, without focal lesion. Reference splenic hypermetabolism 3.7. No abnormal metabolism in the liver, spleen, pancreas, or adrenal glands. Incidental CT findings: Bladder prolapse. Associated moderate left hydroureteronephrosis with dilatation of the left ureter likely to the prolapsed pelvic floor (series 3/image 275). Superimposed large left extrarenal pelvis (series 3/image 189). Tiny hiatal hernia. Atherosclerotic calcifications the abdominal aorta. Left colonic diverticulosis, without evidence of diverticulitis. SKELETON: No focal hypermetabolic activity to suggest skeletal metastasis. Incidental CT findings: Mild degenerative changes of the visualized thoracolumbar spine. Subcutaneous edema in the lower pelvis and bilateral thighs. Suspected postsurgical changes with soft tissue gas in the left inguinal region. IMPRESSION: Supraclavicular, thoracic, and abdominopelvic lymphadenopathy, including dominant right inguinal nodes, as above. This appearance is suspicious for lymphoma. Correlate with pending surgical pathology results. Spleen is normal in size, without focal lesion. Bladder prolapse with associated moderate left  hydroureteronephrosis and superimposed large extrarenal pelvis. Electronically Signed   By: Julian Hy M.D.   On: 08/27/2020 11:05   DG Chest Port 1 View  Result Date: 09/06/2020 CLINICAL DATA:  Status post Port-A-Cath placement. EXAM: PORTABLE CHEST 1 VIEW COMPARISON:  None. FINDINGS: Left subclavian approach Port-A-Cath is in place with its tip projecting in the mid to lower superior vena cava. No pneumothorax. Lungs are clear. Cardiomegaly. Remote left rib fractures noted. IMPRESSION: Port-A-Cath tip projects in the mid to lower superior vena cava. No pneumothorax or acute disease. Electronically Signed   By: Inge Rise M.D.   On: 09/06/2020 11:20   DG C-Arm 1-60 Min-No Report  Result Date: 09/06/2020 Fluoroscopy was utilized by the requesting physician.  No radiographic interpretation.   ECHOCARDIOGRAM COMPLETE  Result Date: 09/05/2020    ECHOCARDIOGRAM REPORT   Patient Name:   OLUWADAMILOLA DELIZ Date of Exam: 09/05/2020 Medical Rec #:  [id number]        Height:       60.0 in Accession #:    [id number]       Weight:       114.1 lb Date of Birth:  [birth year]        BSA:          1.471 m Patient Age:    73 years         BP:           153/77 mmHg Patient Gender: F                HR:           92 bpm. Exam Location:  [facility] Procedure: 2D Echo, Cardiac Doppler and Color Doppler Indications:    Chemo Z09  History:  Patient has no prior history of Echocardiogram examinations.                 Diffuse large B-cell lymphoma of lymph nodes of multiple                 regions.  Sonographer:    Alvino Chapel RCS Referring Phys: [phone] [place]  1. Left ventricular ejection fraction, by estimation, is 70 to 75%. The left ventricle has hyperdynamic function. The left ventricle has no regional wall motion abnormalities. Left ventricular diastolic parameters are consistent with Grade I diastolic dysfunction (impaired relaxation).  2. Right ventricular systolic function is  normal. The right ventricular size is normal. There is normal pulmonary artery systolic pressure.  3. Left atrial size was mildly dilated.  4. The mitral valve is normal in structure. Mild mitral valve regurgitation.  5. The aortic valve is tricuspid. Aortic valve regurgitation is mild. Mild aortic valve sclerosis is present, with no evidence of aortic valve stenosis.  6. The inferior vena cava is normal in size with greater than 50% respiratory variability, suggesting right atrial pressure of 3 mmHg. FINDINGS  Left Ventricle: Left ventricular ejection fraction, by estimation, is 70 to 75%. The left ventricle has hyperdynamic function. The left ventricle has no regional wall motion abnormalities. The left ventricular internal cavity size was normal in size. There is no left ventricular hypertrophy. Left ventricular diastolic parameters are consistent with Grade I diastolic dysfunction (impaired relaxation). Right Ventricle: The right ventricular size is normal. No increase in right ventricular wall thickness. Right ventricular systolic function is normal. There is normal pulmonary artery systolic pressure. The tricuspid regurgitant velocity is 2.74 m/s, and  with an assumed right atrial pressure of 3 mmHg, the estimated right ventricular systolic pressure is 18.2 mmHg. Left Atrium: Left atrial size was mildly dilated. Right Atrium: Right atrial size was normal in size. Pericardium: There is no evidence of pericardial effusion. Mitral Valve: The mitral valve is normal in structure. Mild mitral valve regurgitation. Tricuspid Valve: The tricuspid valve is normal in structure. Tricuspid valve regurgitation is mild. Aortic Valve: The aortic valve is tricuspid. Aortic valve regurgitation is mild. Mild aortic valve sclerosis is present, with no evidence of aortic valve stenosis. Pulmonic Valve: The pulmonic valve was not well visualized. Pulmonic valve regurgitation is not visualized. Aorta: The aortic root is normal in  size and structure. Venous: The inferior vena cava is normal in size with greater than 50% respiratory variability, suggesting right atrial pressure of 3 mmHg. IAS/Shunts: The interatrial septum was not assessed.  LEFT VENTRICLE PLAX 2D LVIDd:         4.50 cm  Diastology LVIDs:         2.60 cm  LV e' medial:    5.44 cm/s LV PW:         1.00 cm  LV E/e' medial:  14.0 LV IVS:        0.90 cm  LV e' lateral:   4.68 cm/s LVOT diam:     1.70 cm  LV E/e' lateral: 16.3 LV SV:         67 LV SV Index:   46 LVOT Area:     2.27 cm  RIGHT VENTRICLE RV S prime:     15.40 cm/s TAPSE (M-mode): 2.5 cm LEFT ATRIUM             Index       RIGHT ATRIUM  Index LA diam:        3.00 cm 2.04 cm/m  RA Area:     12.00 cm LA Vol (A2C):   32.7 ml 22.24 ml/m RA Volume:   25.30 ml  17.20 ml/m LA Vol (A4C):   49.0 ml 33.32 ml/m LA Biplane Vol: 40.5 ml 27.54 ml/m  AORTIC VALVE LVOT Vmax:   147.00 cm/s LVOT Vmean:  90.400 cm/s LVOT VTI:    0.295 m  AORTA Ao Root diam: 3.00 cm MITRAL VALVE                TRICUSPID VALVE MV Area (PHT): 2.62 cm     TR Peak grad:   30.0 mmHg MV Decel Time: 289 msec     TR Vmax:        274.00 cm/s MV E velocity: 76.30 cm/s MV A velocity: 115.00 cm/s  SHUNTS MV E/A ratio:  0.66         Systemic VTI:  0.30 m                             Systemic Diam: 1.70 cm Dorris Carnes MD Electronically signed by Dorris Carnes MD Signature Date/Time: 09/05/2020/3:17:50 PM    Final      ASSESSMENT:  1. Diffuse lymphadenopathy in retroperitoneal, pelvic and inguinal regions: -Presentation with leg swelling startingend of October 2021. -Evaluated at urgent care on 05/15/2020 with leg Doppler-no DVT but showed enlarged lymph nodes in the right groin. -Was evaluated by Dr. Willey Blade and a CT scan [facility] was ordered. -CTAP on 07/31/2020 showed retroperitoneal, pelvic and inguinal adenopathy with mild compression fracture deformity of L1 age-indeterminate. Moderate left hydroureteronephrosis with prominent dilatation of the left  renal pelvis. -PET scan on 08/26/2020 shows supraclavicular, thoracic, abdominal pelvic lymphadenopathy including dominant right inguinal lymph nodes. Spleen is normal without focal lesion. No focal hypermetabolic activity to suggest bone mets. Bladder prolapse with associated moderate left hydroureteronephrosis and superimposed large extrarenal pelvis. 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 -Bone marrow biopsy on 09/04/2020 - for lymphomatous involvement. -2D echo on 09/05/2020 with EF 70-75%. -Cycle 1 of R-CHOP on 09/09/2020.  2. Social/family history: -She worked as a Art therapist prior to retirement. She was a never smoker. -She drinks wine occasionally. -Sister died of ovarian cancer at age 24. Mother died of possible pancreatic cancer.  3.  Left hydroureteronephrosis: -She will follow up with Dr. Alyson Ingles for bladder prolapse and associated left hydroureteronephrosis.   PLAN:  1. 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 -She denied any GI side effects including nausea vomiting or diarrhea.  She had some constipation which is chronic. -She had some sore throat since she got fulphila injection.  Examination of throat did not reveal any thrush or erythema. -Reviewed labs today which showed white count is 0.6 and platelet count 115.  Hemoglobin is 9.5. -LFTs are within normal limits.  She has gained 1 pound. -She will follow up with [place] in 2 weeks prior to cycle 2.  2. Generalized itching: -Continue hydroxyzine 10 mg 3 times a day as needed.  3.  Hypokalemia: -She had severe low potassium of 2.8.  She will receive potassium supplementation today. -We will start her on potassium 20 mEq daily at home.  4. TLS prophylaxis: -She received  rasburicase prior to  chemotherapy. -Reviewed TLS labs today.  Uric acid is 1.7.  Magnesium was 1.8.  Potassium is 2.8.  5. Anxiety: -Continue Xanax 0.25 mg half tablet at bedtime.  Orders placed this encounter:  No orders of the defined types were placed in this encounter.    Derek Jack, MD [place] [phone]   I, Milinda Antis, am acting as a scribe for Dr. Sanda Linger.  I, Derek Jack MD, have reviewed the above documentation for accuracy and completeness, and I agree with the above.

## 2020-09-16 NOTE — Patient Instructions (Signed)
[place] at [facility] Discharge Instructions  You were seen today by Dr. Delton Coombes. He went over your recent results. You may go outside and wear a mask if you will be in a populated setting. You will be prescribed 2 tablets of potassium 10 mEq (20 mEq in total) to take daily; take 4 tablets today when you receive the prescription today. Dr. Delton Coombes will see you back in 2 weeks for labs and follow up.   Thank you for choosing [place] at [facility] to provide your oncology and hematology care.  To afford each patient quality time with our provider, please arrive at least 15 minutes before your scheduled appointment time.   If you have a lab appointment with the [facility] please come in thru the Main Entrance and check in at the main information desk  You need to re-schedule your appointment should you arrive 10 or more minutes late.  We strive to give you quality time with our providers, and arriving late affects you and other patients whose appointments are after yours.  Also, if you no show three or more times for appointments you may be dismissed from the clinic at the providers discretion.     Again, thank you for choosing [facility].  Our hope is that these requests will decrease the amount of time that you wait before being seen by our physicians.       _____________________________________________________________  Should you have questions after your visit to [facility], please contact our office at [phone] between the hours of 8:00 a.m. and 4:30 p.m.  Voicemails left after 4:00 p.m. will not be returned until the following business day.  For prescription refill requests, have your pharmacy contact our office and allow 72 hours.    Cancer Center Support Programs:   > Cancer Support Group  2nd Tuesday of the month 1pm-2pm, Journey Room

## 2020-09-19 ENCOUNTER — Encounter (HOSPITAL_COMMUNITY): Payer: Self-pay

## 2020-09-19 NOTE — Progress Notes (Signed)
Patient called reporting continuous muscle spasms during the night. Patient reports that she could not stay in the bed due to her legs "jerking." Patient states that despite an OTC sleep aid at bedtime and a Xanax at 0200 she did not get any rest last night. MD made aware. MD recommended patient taking a double dose of Xanax prior to bedtime. Patient verbalized understanding.

## 2020-09-28 ENCOUNTER — Other Ambulatory Visit (HOSPITAL_COMMUNITY): Payer: Self-pay | Admitting: Hematology

## 2020-09-30 ENCOUNTER — Inpatient Hospital Stay (HOSPITAL_COMMUNITY): Payer: Medicare Other

## 2020-09-30 ENCOUNTER — Other Ambulatory Visit: Payer: Self-pay

## 2020-09-30 ENCOUNTER — Inpatient Hospital Stay (HOSPITAL_BASED_OUTPATIENT_CLINIC_OR_DEPARTMENT_OTHER): Payer: Medicare Other | Admitting: Hematology

## 2020-09-30 ENCOUNTER — Other Ambulatory Visit (HOSPITAL_COMMUNITY): Payer: Self-pay

## 2020-09-30 VITALS — BP 133/76 | HR 88 | Temp 96.9°F | Resp 18

## 2020-09-30 VITALS — BP 146/67 | HR 80 | Temp 96.8°F | Resp 18 | Wt 113.6 lb

## 2020-09-30 DIAGNOSIS — Z95828 Presence of other vascular implants and grafts: Secondary | ICD-10-CM

## 2020-09-30 DIAGNOSIS — C8330 Diffuse large B-cell lymphoma, unspecified site: Secondary | ICD-10-CM

## 2020-09-30 DIAGNOSIS — C8338 Diffuse large B-cell lymphoma, lymph nodes of multiple sites: Secondary | ICD-10-CM

## 2020-09-30 DIAGNOSIS — Z5112 Encounter for antineoplastic immunotherapy: Secondary | ICD-10-CM | POA: Diagnosis not present

## 2020-09-30 LAB — COMPREHENSIVE METABOLIC PANEL
ALT: 8 U/L (ref 0–44)
AST: 15 U/L (ref 15–41)
Albumin: 2.3 g/dL — ABNORMAL LOW (ref 3.5–5.0)
Alkaline Phosphatase: 118 U/L (ref 38–126)
Anion gap: 8 (ref 5–15)
BUN: 10 mg/dL (ref 8–23)
CO2: 26 mmol/L (ref 22–32)
Calcium: 7.9 mg/dL — ABNORMAL LOW (ref 8.9–10.3)
Chloride: 95 mmol/L — ABNORMAL LOW (ref 98–111)
Creatinine, Ser: 0.63 mg/dL (ref 0.44–1.00)
GFR, Estimated: >60 mL/min (ref >60)
Glucose, Bld: 120 mg/dL — ABNORMAL HIGH (ref 70–99)
Potassium: 3.6 mmol/L (ref 3.5–5.1)
Sodium: 129 mmol/L — ABNORMAL LOW (ref 135–145)
Total Bilirubin: 0.4 mg/dL (ref 0.3–1.2)
Total Protein: 5.3 g/dL — ABNORMAL LOW (ref 6.5–8.1)

## 2020-09-30 LAB — CBC WITH DIFFERENTIAL/PLATELET
Abs Immature Granulocytes: 0.4 10*3/uL — ABNORMAL HIGH (ref 0.00–0.07)
Basophils Absolute: 0.1 10*3/uL (ref 0.0–0.1)
Basophils Relative: 1 %
Eosinophils Absolute: 0 10*3/uL (ref 0.0–0.5)
Eosinophils Relative: 0 %
HCT: 26.4 % — ABNORMAL LOW (ref 36.0–46.0)
Hemoglobin: 8.7 g/dL — ABNORMAL LOW (ref 12.0–15.0)
Immature Granulocytes: 4 %
Lymphocytes Relative: 5 %
Lymphs Abs: 0.5 10*3/uL — ABNORMAL LOW (ref 0.7–4.0)
MCH: 28.1 pg (ref 26.0–34.0)
MCHC: 33 g/dL (ref 30.0–36.0)
MCV: 85.2 fL (ref 80.0–100.0)
Monocytes Absolute: 0.5 10*3/uL (ref 0.1–1.0)
Monocytes Relative: 5 %
Neutro Abs: 8.8 10*3/uL — ABNORMAL HIGH (ref 1.7–7.7)
Neutrophils Relative %: 85 %
Platelets: 237 10*3/uL (ref 150–400)
RBC: 3.1 MIL/uL — ABNORMAL LOW (ref 3.87–5.11)
RDW: 13.8 % (ref 11.5–15.5)
WBC: 10.3 10*3/uL (ref 4.0–10.5)
nRBC: 0 % (ref 0.0–0.2)

## 2020-09-30 MED ORDER — SODIUM CHLORIDE 0.9 % IV SOLN
10.0000 mg | Freq: Once | INTRAVENOUS | Status: AC
  Administered 2020-09-30: 10 mg via INTRAVENOUS
  Filled 2020-09-30: qty 10

## 2020-09-30 MED ORDER — SODIUM CHLORIDE 0.9 % IV SOLN
600.0000 mg/m2 | Freq: Once | INTRAVENOUS | Status: AC
  Administered 2020-09-30: 880 mg via INTRAVENOUS
  Filled 2020-09-30: qty 44

## 2020-09-30 MED ORDER — PALONOSETRON HCL INJECTION 0.25 MG/5ML
0.2500 mg | Freq: Once | INTRAVENOUS | Status: AC
  Administered 2020-09-30: 0.25 mg via INTRAVENOUS

## 2020-09-30 MED ORDER — SODIUM CHLORIDE 0.9 % IV SOLN
Freq: Once | INTRAVENOUS | Status: AC
Start: 2020-09-30 — End: 2020-09-30

## 2020-09-30 MED ORDER — SODIUM CHLORIDE 0.9% FLUSH
10.0000 mL | INTRAVENOUS | Status: DC | PRN
  Administered 2020-09-30: 10 mL

## 2020-09-30 MED ORDER — ACETAMINOPHEN 325 MG PO TABS
650.0000 mg | ORAL_TABLET | Freq: Once | ORAL | Status: AC
  Administered 2020-09-30: 650 mg via ORAL

## 2020-09-30 MED ORDER — DOXORUBICIN HCL CHEMO IV INJECTION 2 MG/ML
25.0000 mg/m2 | Freq: Once | INTRAVENOUS | Status: AC
  Administered 2020-09-30: 38 mg via INTRAVENOUS
  Filled 2020-09-30: qty 19

## 2020-09-30 MED ORDER — VINCRISTINE SULFATE CHEMO INJECTION 1 MG/ML
2.0000 mg | Freq: Once | INTRAVENOUS | Status: AC
  Administered 2020-09-30: 2 mg via INTRAVENOUS
  Filled 2020-09-30: qty 2

## 2020-09-30 MED ORDER — SODIUM CHLORIDE 0.9 % IV SOLN
375.0000 mg/m2 | Freq: Once | INTRAVENOUS | Status: AC
  Administered 2020-09-30: 600 mg via INTRAVENOUS
  Filled 2020-09-30: qty 50

## 2020-09-30 MED ORDER — DIPHENHYDRAMINE HCL 25 MG PO CAPS
50.0000 mg | ORAL_CAPSULE | Freq: Once | ORAL | Status: AC
Start: 2020-09-30 — End: 2020-09-30
  Administered 2020-09-30: 50 mg via ORAL

## 2020-09-30 MED ORDER — DIPHENHYDRAMINE HCL 25 MG PO CAPS
ORAL_CAPSULE | ORAL | Status: AC
  Filled 2020-09-30: qty 2

## 2020-09-30 MED ORDER — HEPARIN SOD (PORK) LOCK FLUSH 100 UNIT/ML IV SOLN
500.0000 [IU] | Freq: Once | INTRAVENOUS | Status: AC | PRN
  Administered 2020-09-30: 500 [IU]

## 2020-09-30 MED ORDER — SODIUM CHLORIDE 0.9 % IV SOLN
150.0000 mg | Freq: Once | INTRAVENOUS | Status: AC
  Administered 2020-09-30: 150 mg via INTRAVENOUS
  Filled 2020-09-30: qty 5

## 2020-09-30 MED ORDER — ALPRAZOLAM 0.25 MG PO TABS: 0.1250 mg | ORAL_TABLET | Freq: Every evening | ORAL | 0 refills | Status: DC | PRN

## 2020-09-30 MED ORDER — ACETAMINOPHEN 325 MG PO TABS
ORAL_TABLET | ORAL | Status: AC
  Filled 2020-09-30: qty 2

## 2020-09-30 MED ORDER — PALONOSETRON HCL INJECTION 0.25 MG/5ML
INTRAVENOUS | Status: AC
  Filled 2020-09-30: qty 5

## 2020-09-30 NOTE — Progress Notes (Signed)
[place], [place] [address]   CLINIC:  Medical Oncology/Hematology  PCP:  Asencion Noble, MD [address] / [place] [address] [phone]   REASON FOR VISIT:  Follow-up for DLBCL  PRIOR THERAPY: None  NGS Results: Not done  CURRENT THERAPY: R-CHOP every 3 weeks  BRIEF ONCOLOGIC HISTORY:  Oncology History  DLBCL (diffuse large B cell lymphoma) ([place])  08/29/2020 Initial Diagnosis   DLBCL (diffuse large B cell lymphoma) ([facility])   08/29/2020 Cancer Staging   Staging form: Hodgkin and Non-Hodgkin Lymphoma, AJCC 8th Edition - Clinical stage from 08/29/2020: Stage III - Signed by Derek Jack, MD on 08/29/2020 Stage prefix: Initial diagnosis   09/09/2020 -  Chemotherapy    Patient is on Treatment Plan: NON-HODGKINS LYMPHOMA R-CHOP Q21D        CANCER STAGING: Cancer Staging DLBCL (diffuse large B cell lymphoma) ([facility]) Staging form: Hodgkin and Non-Hodgkin Lymphoma, AJCC 8th Edition - Clinical stage from 08/29/2020: Stage III - Signed by Derek Jack, MD on 08/29/2020   INTERVAL HISTORY:  [name], a 80 y.o. female, returns for routine follow-up and consideration for next cycle of chemotherapy. Erica Mora was last seen on 09/16/2020.  Due for cycle #2 of R-CHOP today.   Today she is accompanied by her husband. Overall, she tells me she has been feeling okay. She tolerated the previous treatment well and denies having N/V. Her leg swelling has come down slightly. Her appetite is slightly decreased. Her itching has resolved and she is no longer requiring hydroxyzine. She denies having sore throat but her voice hoarseness has been going on for the past several days. She continues taking Xanax at bedtime as needed and Benadryl for sleep. She denies having heart issues or MI's. She denies having F/C or orthopnea.  Overall, she feels ready for next cycle of chemo today.    REVIEW OF SYSTEMS:  Review of Systems   Constitutional: Positive for appetite change (75%), fatigue (75%) and unexpected weight change (lost 3 lbs in 3 weeks). Negative for chills and fever.  HENT:   Negative for sore throat.        Hoarse voice for several days  Respiratory: Negative for shortness of breath.   Cardiovascular: Positive for leg swelling (slightly decreased).  Gastrointestinal: Negative for nausea and vomiting.  Skin: Negative for itching.  Psychiatric/Behavioral: Positive for sleep disturbance (on Benadryl). The patient is nervous/anxious (on Xanax).   All other systems reviewed and are negative.   PAST MEDICAL/SURGICAL HISTORY:  Past Medical History:  Diagnosis Date  . Port-A-Cath in place 09/03/2020  . Prolapse of female bladder, acquired 1973   Past Surgical History:  Procedure Laterality Date  . EYE SURGERY     cataracts- bilateral    . INGUINAL LYMPH NODE BIOPSY Left 08/23/2020   Procedure: INGUINAL LYMPH NODE BIOPSY, LEFT;  Surgeon: Virl Cagey, MD;  Location: [facility];  Service: General;  Laterality: Left;  . ORIF HUMERUS FRACTURE Left 08/02/2015   Procedure: OPEN REDUCTION INTERNAL FIXATION (ORIF)LEFT DISTAL RADIUS,HUMERUS FRACTURE;  Surgeon: Altamese Paw Paw, MD;  Location: [facility];  Service: Orthopedics;  Laterality: Left;  . PORTACATH PLACEMENT Left 09/06/2020   Procedure: INSERTION PORT-A-CATH;  Surgeon: Virl Cagey, MD;  Location: [facility];  Service: General;  Laterality: Left;  . TUBAL LIGATION    . VAGINAL DELIVERY      SOCIAL HISTORY:  Social History   Socioeconomic History  . Marital status: Married  Spouse name: Not on file  . Number of children: Not on file  . Years of education: Not on file  . Highest education level: Not on file  Occupational History  . Not on file  Tobacco Use  . Smoking status: Never Smoker  . Smokeless tobacco: Never Used  Vaping Use  . Vaping Use: Never used  Substance and Sexual Activity  . Alcohol use: Yes    Comment: wine glasses per day - 2  per day  . Drug use: No  . Sexual activity: Not Currently  Other Topics Concern  . Not on file  Social History Narrative  . Not on file   Social Determinants of Health   Financial Resource Strain: Low Risk   . Difficulty of Paying Living Expenses: Not hard at all  Food Insecurity: No Food Insecurity  . Worried About Charity fundraiser in the Last Year: Never true  . Ran Out of Food in the Last Year: Never true  Transportation Needs: No Transportation Needs  . Lack of Transportation (Medical): No  . Lack of Transportation (Non-Medical): No  Physical Activity: Inactive  . Days of Exercise per Week: 0 days  . Minutes of Exercise per Session: 0 min  Stress: No Stress Concern Present  . Feeling of Stress : Only a little  Social Connections: Moderately Integrated  . Frequency of Communication with Friends and Family: More than three times a week  . Frequency of Social Gatherings with Friends and Family: More than three times a week  . Attends Religious Services: More than 4 times per year  . Active Member of Clubs or Organizations: No  . Attends Archivist Meetings: Never  . Marital Status: Married  Human resources officer Violence: Not At Risk  . Fear of Current or Ex-Partner: No  . Emotionally Abused: No  . Physically Abused: No  . Sexually Abused: No    FAMILY HISTORY:  No family history on file.  CURRENT MEDICATIONS:  Current Outpatient Medications  Medication Sig Dispense Refill  . allopurinol (ZYLOPRIM) 300 MG tablet Take 1 tablet (300 mg total) by mouth daily. 30 tablet 2  . ALPRAZolam (XANAX) 0.25 MG tablet Take 0.5 tablets (0.125 mg total) by mouth at bedtime as needed for anxiety. 30 tablet 0  . CYCLOPHOSPHAMIDE IV Inject into the vein every 21 ( twenty-one) days.    Marland Kitchen DOXORUBICIN HCL IV Inject into the vein every 21 ( twenty-one) days.    . Doxylamine Succinate, Sleep, (SLEEP AID PO) Take 1 tablet by mouth at bedtime.    . hydrOXYzine (ATARAX/VISTARIL) 10 MG  tablet Take 10 mg by mouth 3 (three) times daily as needed for anxiety or itching.    . lidocaine-prilocaine (EMLA) cream Apply a small amount to port a cath site and cover with plastic wrap 1 hour prior to infusion appointments. 30 g 3  . naproxen sodium (ALEVE) 220 MG tablet Take 110 mg by mouth 2 (two) times daily as needed (pain).    . ondansetron (ZOFRAN) 4 MG tablet Take 1 tablet (4 mg total) by mouth every 8 (eight) hours as needed. 30 tablet 1  . oxyCODONE (ROXICODONE) 5 MG immediate release tablet Take 1 tablet (5 mg total) by mouth every 4 (four) hours as needed for severe pain or breakthrough pain. 5 tablet 0  . potassium chloride (KLOR-CON) 10 MEQ tablet Take 1 tablet (10 mEq total) by mouth daily. Take 2 tablets by mouth once daily. 60 tablet 1  .  potassium chloride (KLOR-CON) 10 MEQ tablet Take 20 mEq by mouth daily.    . predniSONE (DELTASONE) 20 MG tablet Take 5 tablets (100 mg) by mouth with breakfast daily on Days 1-5 of chemotherapy treatments 150 tablet 0  . prochlorperazine (COMPAZINE) 10 MG tablet Take 1 tablet (10 mg total) by mouth every 6 (six) hours as needed (Nausea or vomiting). 30 tablet 6  . RITUXIMAB IV Inject into the vein every 21 ( twenty-one) days.    Marland Kitchen VINCRISTINE SULFATE IV Inject into the vein every 21 ( twenty-one) days.     No current facility-administered medications for this visit.    ALLERGIES:  No Known Allergies  PHYSICAL EXAM:  Performance status (ECOG): 1 - Symptomatic but completely ambulatory  Vitals:   09/30/20 0924  BP: (!) 146/67  Pulse: 80  Resp: 18  Temp: (!) 96.8 F (36 C)  SpO2: 100%   Wt Readings from Last 3 Encounters:  09/30/20 113 lb 9.6 oz (51.5 kg)  09/16/20 117 lb 12.8 oz (53.4 kg)  09/09/20 116 lb 6.4 oz (52.8 kg)   Physical Exam Vitals reviewed.  Constitutional:      Appearance: Normal appearance.  Cardiovascular:     Rate and Rhythm: Normal rate and regular rhythm.     Pulses: Normal pulses.     Heart sounds:  Normal heart sounds.  Pulmonary:     Effort: Pulmonary effort is normal.     Breath sounds: Normal breath sounds.  Chest:     Comments: Port-a-Cath in L chest Musculoskeletal:     Right lower leg: Tenderness present. Edema (lymphedema) present.     Left lower leg: Tenderness present. Edema (lymphedema) present.  Neurological:     General: No focal deficit present.     Mental Status: She is alert and oriented to person, place, and time.  Psychiatric:        Mood and Affect: Mood normal.        Behavior: Behavior normal.     LABORATORY DATA:  I have reviewed the labs as listed.  CBC Latest Ref Rng & Units 09/30/2020 09/16/2020 09/09/2020  WBC 4.0 - 10.5 K/uL 10.3 0.6(LL) 9.0  Hemoglobin 12.0 - 15.0 g/dL 8.7(L) 9.5(L) 10.1(L)  Hematocrit 36.0 - 46.0 % 26.4(L) 29.5(L) 31.1(L)  Platelets 150 - 400 K/uL 237 115(L) 168   CMP Latest Ref Rng & Units 09/30/2020 09/16/2020 09/09/2020  Glucose 70 - 99 mg/dL 120(H) 127(H) 115(H)  BUN 8 - 23 mg/dL 10 12 11   Creatinine 0.44 - 1.00 mg/dL 0.63 0.64 0.75  Sodium 135 - 145 mmol/L 129(L) 131(L) 128(L)  Potassium 3.5 - 5.1 mmol/L 3.6 2.8(L) 3.3(L)  Chloride 98 - 111 mmol/L 95(L) 93(L) 91(L)  CO2 22 - 32 mmol/L 26 29 28   Calcium 8.9 - 10.3 mg/dL 7.9(L) 8.2(L) 8.3(L)  Total Protein 6.5 - 8.1 g/dL 5.3(L) 5.8(L) 6.1(L)  Total Bilirubin 0.3 - 1.2 mg/dL 0.4 0.4 0.8  Alkaline Phos 38 - 126 U/L 118 79 97  AST 15 - 41 U/L 15 13(L) 18  ALT 0 - 44 U/L 8 11 7     DIAGNOSTIC IMAGING:  I have independently reviewed the scans and discussed with the patient. DG Chest Port 1 View  Result Date: 09/06/2020 CLINICAL DATA:  Status post Port-A-Cath placement. EXAM: PORTABLE CHEST 1 VIEW COMPARISON:  None. FINDINGS: Left subclavian approach Port-A-Cath is in place with its tip projecting in the mid to lower superior vena cava. No pneumothorax. Lungs are clear. Cardiomegaly. Remote left  rib fractures noted. IMPRESSION: Port-A-Cath tip projects in the mid to lower superior  vena cava. No pneumothorax or acute disease. Electronically Signed   By: Inge Rise M.D.   On: 09/06/2020 11:20   DG C-Arm 1-60 Min-No Report  Result Date: 09/06/2020 Fluoroscopy was utilized by the requesting physician.  No radiographic interpretation.   ECHOCARDIOGRAM COMPLETE  Result Date: 09/05/2020    ECHOCARDIOGRAM REPORT   Patient Name:   KYMORAH KORF Date of Exam: 09/05/2020 Medical Rec #:  [id number]        Height:       60.0 in Accession #:    [id number]       Weight:       114.1 lb Date of Birth:  1940/09/08        BSA:          1.471 m Patient Age:    58 years         BP:           153/77 mmHg Patient Gender: F                HR:           92 bpm. Exam Location:  [facility] Procedure: 2D Echo, Cardiac Doppler and Color Doppler Indications:    Chemo Z09  History:        Patient has no prior history of Echocardiogram examinations.                 Diffuse large B-cell lymphoma of lymph nodes of multiple                 regions.  Sonographer:    Alvino Chapel RCS Referring Phys: [phone] [place]  1. Left ventricular ejection fraction, by estimation, is 70 to 75%. The left ventricle has hyperdynamic function. The left ventricle has no regional wall motion abnormalities. Left ventricular diastolic parameters are consistent with Grade I diastolic dysfunction (impaired relaxation).  2. Right ventricular systolic function is normal. The right ventricular size is normal. There is normal pulmonary artery systolic pressure.  3. Left atrial size was mildly dilated.  4. The mitral valve is normal in structure. Mild mitral valve regurgitation.  5. The aortic valve is tricuspid. Aortic valve regurgitation is mild. Mild aortic valve sclerosis is present, with no evidence of aortic valve stenosis.  6. The inferior vena cava is normal in size with greater than 50% respiratory variability, suggesting right atrial pressure of 3 mmHg. FINDINGS  Left Ventricle: Left ventricular ejection  fraction, by estimation, is 70 to 75%. The left ventricle has hyperdynamic function. The left ventricle has no regional wall motion abnormalities. The left ventricular internal cavity size was normal in size. There is no left ventricular hypertrophy. Left ventricular diastolic parameters are consistent with Grade I diastolic dysfunction (impaired relaxation). Right Ventricle: The right ventricular size is normal. No increase in right ventricular wall thickness. Right ventricular systolic function is normal. There is normal pulmonary artery systolic pressure. The tricuspid regurgitant velocity is 2.74 m/s, and  with an assumed right atrial pressure of 3 mmHg, the estimated right ventricular systolic pressure is 12.4 mmHg. Left Atrium: Left atrial size was mildly dilated. Right Atrium: Right atrial size was normal in size. Pericardium: There is no evidence of pericardial effusion. Mitral Valve: The mitral valve is normal in structure. Mild mitral valve regurgitation. Tricuspid Valve: The tricuspid valve is normal in structure. Tricuspid valve regurgitation is mild. Aortic Valve: The aortic valve  is tricuspid. Aortic valve regurgitation is mild. Mild aortic valve sclerosis is present, with no evidence of aortic valve stenosis. Pulmonic Valve: The pulmonic valve was not well visualized. Pulmonic valve regurgitation is not visualized. Aorta: The aortic root is normal in size and structure. Venous: The inferior vena cava is normal in size with greater than 50% respiratory variability, suggesting right atrial pressure of 3 mmHg. IAS/Shunts: The interatrial septum was not assessed.  LEFT VENTRICLE PLAX 2D LVIDd:         4.50 cm  Diastology LVIDs:         2.60 cm  LV e' medial:    5.44 cm/s LV PW:         1.00 cm  LV E/e' medial:  14.0 LV IVS:        0.90 cm  LV e' lateral:   4.68 cm/s LVOT diam:     1.70 cm  LV E/e' lateral: 16.3 LV SV:         67 LV SV Index:   46 LVOT Area:     2.27 cm  RIGHT VENTRICLE RV S prime:      15.40 cm/s TAPSE (M-mode): 2.5 cm LEFT ATRIUM             Index       RIGHT ATRIUM           Index LA diam:        3.00 cm 2.04 cm/m  RA Area:     12.00 cm LA Vol (A2C):   32.7 ml 22.24 ml/m RA Volume:   25.30 ml  17.20 ml/m LA Vol (A4C):   49.0 ml 33.32 ml/m LA Biplane Vol: 40.5 ml 27.54 ml/m  AORTIC VALVE LVOT Vmax:   147.00 cm/s LVOT Vmean:  90.400 cm/s LVOT VTI:    0.295 m  AORTA Ao Root diam: 3.00 cm MITRAL VALVE                TRICUSPID VALVE MV Area (PHT): 2.62 cm     TR Peak grad:   30.0 mmHg MV Decel Time: 289 msec     TR Vmax:        274.00 cm/s MV E velocity: 76.30 cm/s MV A velocity: 115.00 cm/s  SHUNTS MV E/A ratio:  0.66         Systemic VTI:  0.30 m                             Systemic Diam: 1.70 cm Dorris Carnes MD Electronically signed by Dorris Carnes MD Signature Date/Time: 09/05/2020/3:17:50 PM    Final      ASSESSMENT:  1. Diffuse lymphadenopathy in retroperitoneal, pelvic and inguinal regions: -Presentation with leg swelling startingend of October 2021. -Evaluated at urgent care on 05/15/2020 with leg Doppler-no DVT but showed enlarged lymph nodes in the right groin. -Was evaluated by Dr. Willey Blade and a CT scan [facility] was ordered. -CTAP on 07/31/2020 showed retroperitoneal, pelvic and inguinal adenopathy with mild compression fracture deformity of L1 age-indeterminate. Moderate left hydroureteronephrosis with prominent dilatation of the left renal pelvis. -PET scan on 08/26/2020 shows supraclavicular, thoracic, abdominal pelvic lymphadenopathy including dominant right inguinal lymph nodes. Spleen is normal without focal lesion. No focal hypermetabolic activity to suggest bone mets. Bladder prolapse with associated moderate left hydroureteronephrosis and superimposed large extrarenal pelvis. 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 -Bone marrow biopsy on 09/04/2020 - for  lymphomatous involvement. -2D echo on 09/05/2020 with EF 70-75%. -Cycle 1 of R-CHOP on 09/09/2020.  2. Social/family history: -She worked as a Art therapist prior to retirement. She was a never smoker. -She drinks wine occasionally. -Sister died of ovarian cancer at age 40. Mother died of possible pancreatic cancer.  3.  Left hydroureteronephrosis: -She will follow up with Dr. Alyson Ingles for bladder prolapse and associated left hydroureteronephrosis.   PLAN:  1. 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 - Overall she has a tolerated it reasonably well.  She lost about 3 pounds in the last 3 weeks.  Itching improved. - Reviewed her labs which showed albumin 2.3 and other LFTs normal.  CBC shows white count is normal and platelet count 237.  Hemoglobin is 8.7, likely from bone marrow suppression. - We will proceed with cycle 2 today.  We will increase his Cytoxan dose to 600 mg per metered square.  RTC 3 weeks for follow-up.  2. Generalized itching: -Itching has improved after first cycle.  She is not requiring hydroxyzine daily.  3.  Hypokalemia: -Continue potassium 20 mEq daily.  Potassium today is 3.6.  4. TLS prophylaxis: -Continue allopurinol until she finishes the bottle and stop it.  5. Anxiety: -Continue Xanax 0.25 mg half tablet at bedtime.  I have sent a refill.  This is also helping with sleep.   Orders placed this encounter:  No orders of the defined types were placed in this encounter.    Derek Jack, MD [facility] [phone]   I, Milinda Antis, am acting as a scribe for Dr. Sanda Linger.  I, Derek Jack MD, have reviewed the above documentation for accuracy and completeness, and I agree with the above.

## 2020-09-30 NOTE — Patient Instructions (Signed)
[place] at [facility] Discharge Instructions  You were seen today by Dr. Delton Coombes. He went over your recent results. You received your treatment today. Your next scan to check the progress of your lymphoma will be after your 3rd treatment. Eat protein-dense meals and drink Boost/Ensure Max Protein or protein shakes to improve your blood albumin level. Finish your bottle of allopurinol and stop taking it. Dr. Delton Coombes will see you back in 3 weeks for labs and follow up.   Thank you for choosing [place] at [facility] to provide your oncology and hematology care.  To afford each patient quality time with our provider, please arrive at least 15 minutes before your scheduled appointment time.   If you have a lab appointment with the [facility] please come in thru the Main Entrance and check in at the main information desk  You need to re-schedule your appointment should you arrive 10 or more minutes late.  We strive to give you quality time with our providers, and arriving late affects you and other patients whose appointments are after yours.  Also, if you no show three or more times for appointments you may be dismissed from the clinic at the providers discretion.     Again, thank you for choosing [facility].  Our hope is that these requests will decrease the amount of time that you wait before being seen by our physicians.       _____________________________________________________________  Should you have questions after your visit to [facility], please contact our office at [phone] between the hours of 8:00 a.m. and 4:30 p.m.  Voicemails left after 4:00 p.m. will not be returned until the following business day.  For prescription refill requests, have your pharmacy contact our office and allow 72 hours.    Cancer Center Support Programs:   > Cancer Support Group  2nd Tuesday of the month 1pm-2pm,  Journey Room

## 2020-09-30 NOTE — Patient Instructions (Signed)
[facility] Discharge Instructions for Patients Receiving Chemotherapy  Today you received the following chemotherapy agents R-CHOP  To help prevent nausea and vomiting after your treatment, we encourage you to take your nausea medication   If you develop nausea and vomiting that is not controlled by your nausea medication, call the clinic.   BELOW ARE SYMPTOMS THAT SHOULD BE REPORTED IMMEDIATELY:  *FEVER GREATER THAN 100.5 F  *CHILLS WITH OR WITHOUT FEVER  NAUSEA AND VOMITING THAT IS NOT CONTROLLED WITH YOUR NAUSEA MEDICATION  *UNUSUAL SHORTNESS OF BREATH  *UNUSUAL BRUISING OR BLEEDING  TENDERNESS IN MOUTH AND THROAT WITH OR WITHOUT PRESENCE OF ULCERS  *URINARY PROBLEMS  *BOWEL PROBLEMS  UNUSUAL RASH Items with * indicate a potential emergency and should be followed up as soon as possible.  Feel free to call the clinic should you have any questions or concerns. The clinic phone number is [phone].  Please show the [facility] at check-in to the Emergency Department and triage nurse.

## 2020-09-30 NOTE — Progress Notes (Signed)
Patient was assessed by Dr. Delton Coombes and labs have been reviewed.  Patient is okay to proceed with treatment today. Dose on Cytoxan increased from 500 mg/m2 to 600 mg/m2.  Primary RN and pharmacy aware.

## 2020-09-30 NOTE — Progress Notes (Signed)
Patient presents today for R-CHOP infusion.  Vital signs and labs within parameters for treatment.  Patient has no new complaints since last visit.  R-CHOP infusion given today per MD orders.  Stable during infusion infusion without adverse affects.  Vital signs stable.  No complaints at this time.  Discharge from clinic ambulatory in stable condition.  Alert and oriented X 3.  Follow up with [facility] as scheduled.

## 2020-10-01 ENCOUNTER — Other Ambulatory Visit (HOSPITAL_COMMUNITY): Payer: Self-pay

## 2020-10-01 DIAGNOSIS — C8338 Diffuse large B-cell lymphoma, lymph nodes of multiple sites: Secondary | ICD-10-CM

## 2020-10-01 MED ORDER — ALPRAZOLAM 0.25 MG PO TABS: 0.2500 mg | ORAL_TABLET | Freq: Every evening | ORAL | 3 refills | Status: AC | PRN

## 2020-10-02 ENCOUNTER — Inpatient Hospital Stay (HOSPITAL_COMMUNITY): Payer: Medicare Other

## 2020-10-02 ENCOUNTER — Other Ambulatory Visit: Payer: Self-pay

## 2020-10-02 VITALS — BP 139/69 | HR 88 | Resp 16

## 2020-10-02 DIAGNOSIS — Z5112 Encounter for antineoplastic immunotherapy: Secondary | ICD-10-CM | POA: Diagnosis not present

## 2020-10-02 DIAGNOSIS — Z95828 Presence of other vascular implants and grafts: Secondary | ICD-10-CM

## 2020-10-02 DIAGNOSIS — C8330 Diffuse large B-cell lymphoma, unspecified site: Secondary | ICD-10-CM

## 2020-10-02 MED ORDER — PEGFILGRASTIM-JMDB 6 MG/0.6ML ~~LOC~~ SOSY
6.0000 mg | PREFILLED_SYRINGE | Freq: Once | SUBCUTANEOUS | Status: AC
  Administered 2020-10-02: 6 mg via SUBCUTANEOUS
  Filled 2020-10-02: qty 0.6

## 2020-10-02 NOTE — Patient Instructions (Signed)
[facility] at [facility]  Discharge Instructions:   _______________________________________________________________  Thank you for choosing [facility] at [facility] to provide your oncology and hematology care.  To afford each patient quality time with our providers, please arrive at least 15 minutes before your scheduled appointment.  You need to re-schedule your appointment if you arrive 10 or more minutes late.  We strive to give you quality time with our providers, and arriving late affects you and other patients whose appointments are after yours.  Also, if you no show three or more times for appointments you may be dismissed from the clinic.  Again, thank you for choosing [facility]ospital. Our hope is that these requests will allow you access to exceptional care and in a timely manner. _______________________________________________________________  If you have questions after your visit, please contact our office at [phone] between the hours of 8:30 a.m. and 5:00 p.m. Voicemails left after 4:30 p.m. will not be returned until the following business day. _______________________________________________________________  For prescription refill requests, have your pharmacy contact our office. _______________________________________________________________  Recommendations made by the consultant and any test results will be sent to your referring physician. _______________________________________________________________ 

## 2020-10-21 ENCOUNTER — Inpatient Hospital Stay (HOSPITAL_COMMUNITY): Payer: Medicare Other

## 2020-10-21 ENCOUNTER — Inpatient Hospital Stay (HOSPITAL_BASED_OUTPATIENT_CLINIC_OR_DEPARTMENT_OTHER): Payer: Medicare Other | Admitting: Hematology

## 2020-10-21 ENCOUNTER — Inpatient Hospital Stay (HOSPITAL_COMMUNITY): Payer: Medicare Other | Attending: Hematology

## 2020-10-21 ENCOUNTER — Other Ambulatory Visit: Payer: Self-pay

## 2020-10-21 ENCOUNTER — Ambulatory Visit (HOSPITAL_COMMUNITY): Payer: Medicare Other | Admitting: Dietician

## 2020-10-21 VITALS — BP 135/70 | HR 80 | Temp 97.0°F | Resp 16

## 2020-10-21 VITALS — BP 150/68 | HR 97 | Temp 96.8°F | Resp 16 | Wt 110.0 lb

## 2020-10-21 DIAGNOSIS — Z5189 Encounter for other specified aftercare: Secondary | ICD-10-CM | POA: Insufficient documentation

## 2020-10-21 DIAGNOSIS — R590 Localized enlarged lymph nodes: Secondary | ICD-10-CM

## 2020-10-21 DIAGNOSIS — C8330 Diffuse large B-cell lymphoma, unspecified site: Secondary | ICD-10-CM

## 2020-10-21 DIAGNOSIS — Z95828 Presence of other vascular implants and grafts: Secondary | ICD-10-CM

## 2020-10-21 DIAGNOSIS — C8335 Diffuse large B-cell lymphoma, lymph nodes of inguinal region and lower limb: Secondary | ICD-10-CM | POA: Insufficient documentation

## 2020-10-21 DIAGNOSIS — D61810 Antineoplastic chemotherapy induced pancytopenia: Secondary | ICD-10-CM | POA: Insufficient documentation

## 2020-10-21 DIAGNOSIS — C8338 Diffuse large B-cell lymphoma, lymph nodes of multiple sites: Secondary | ICD-10-CM

## 2020-10-21 DIAGNOSIS — Z5112 Encounter for antineoplastic immunotherapy: Secondary | ICD-10-CM | POA: Diagnosis not present

## 2020-10-21 DIAGNOSIS — Z5111 Encounter for antineoplastic chemotherapy: Secondary | ICD-10-CM | POA: Diagnosis present

## 2020-10-21 LAB — CBC WITH DIFFERENTIAL/PLATELET
Abs Immature Granulocytes: 0.15 10*3/uL — ABNORMAL HIGH (ref 0.00–0.07)
Basophils Absolute: 0.1 10*3/uL (ref 0.0–0.1)
Basophils Relative: 1 %
Eosinophils Absolute: 0 10*3/uL (ref 0.0–0.5)
Eosinophils Relative: 0 %
HCT: 23.7 % — ABNORMAL LOW (ref 36.0–46.0)
Hemoglobin: 7.6 g/dL — ABNORMAL LOW (ref 12.0–15.0)
Immature Granulocytes: 1 %
Lymphocytes Relative: 22 %
Lymphs Abs: 2.3 10*3/uL (ref 0.7–4.0)
MCH: 27.8 pg (ref 26.0–34.0)
MCHC: 32.1 g/dL (ref 30.0–36.0)
MCV: 86.8 fL (ref 80.0–100.0)
Monocytes Absolute: 1.4 10*3/uL — ABNORMAL HIGH (ref 0.1–1.0)
Monocytes Relative: 13 %
Neutro Abs: 6.6 10*3/uL (ref 1.7–7.7)
Neutrophils Relative %: 63 %
Platelets: 302 10*3/uL (ref 150–400)
RBC: 2.73 MIL/uL — ABNORMAL LOW (ref 3.87–5.11)
RDW: 16.5 % — ABNORMAL HIGH (ref 11.5–15.5)
WBC: 10.6 10*3/uL — ABNORMAL HIGH (ref 4.0–10.5)
nRBC: 0 % (ref 0.0–0.2)

## 2020-10-21 LAB — COMPREHENSIVE METABOLIC PANEL
ALT: 8 U/L (ref 0–44)
AST: 14 U/L — ABNORMAL LOW (ref 15–41)
Albumin: 2.3 g/dL — ABNORMAL LOW (ref 3.5–5.0)
Alkaline Phosphatase: 115 U/L (ref 38–126)
Anion gap: 5 (ref 5–15)
BUN: 10 mg/dL (ref 8–23)
CO2: 26 mmol/L (ref 22–32)
Calcium: 8.2 mg/dL — ABNORMAL LOW (ref 8.9–10.3)
Chloride: 101 mmol/L (ref 98–111)
Creatinine, Ser: 0.68 mg/dL (ref 0.44–1.00)
GFR, Estimated: >60 mL/min (ref >60)
Glucose, Bld: 101 mg/dL — ABNORMAL HIGH (ref 70–99)
Potassium: 3.8 mmol/L (ref 3.5–5.1)
Sodium: 132 mmol/L — ABNORMAL LOW (ref 135–145)
Total Bilirubin: 0.5 mg/dL (ref 0.3–1.2)
Total Protein: 5.4 g/dL — ABNORMAL LOW (ref 6.5–8.1)

## 2020-10-21 LAB — ABO/RH: ABO/RH(D): O POS

## 2020-10-21 LAB — PREPARE RBC (CROSSMATCH)

## 2020-10-21 MED ORDER — SODIUM CHLORIDE 0.9 % IV SOLN
10.0000 mg | Freq: Once | INTRAVENOUS | Status: AC
  Administered 2020-10-21: 10 mg via INTRAVENOUS
  Filled 2020-10-21: qty 10

## 2020-10-21 MED ORDER — SODIUM CHLORIDE 0.9% IV SOLUTION
250.0000 mL | Freq: Once | INTRAVENOUS | Status: DC
Start: 2020-10-21 — End: 2020-10-21

## 2020-10-21 MED ORDER — SODIUM CHLORIDE 0.9 % IV SOLN
150.0000 mg | Freq: Once | INTRAVENOUS | Status: AC
  Administered 2020-10-21: 150 mg via INTRAVENOUS
  Filled 2020-10-21: qty 150

## 2020-10-21 MED ORDER — SODIUM CHLORIDE 0.9% FLUSH
3.0000 mL | INTRAVENOUS | Status: DC | PRN
Start: 2020-10-21 — End: 2020-10-21

## 2020-10-21 MED ORDER — ACETAMINOPHEN 325 MG PO TABS
ORAL_TABLET | ORAL | Status: AC
  Filled 2020-10-21: qty 2

## 2020-10-21 MED ORDER — DIPHENHYDRAMINE HCL 25 MG PO CAPS: 25.0000 mg | ORAL_CAPSULE | Freq: Once | ORAL | Status: DC

## 2020-10-21 MED ORDER — DIPHENHYDRAMINE HCL 25 MG PO CAPS
50.0000 mg | ORAL_CAPSULE | Freq: Once | ORAL | Status: AC
  Administered 2020-10-21: 50 mg via ORAL

## 2020-10-21 MED ORDER — DOXORUBICIN HCL CHEMO IV INJECTION 2 MG/ML
25.0000 mg/m2 | Freq: Once | INTRAVENOUS | Status: AC
  Administered 2020-10-21: 38 mg via INTRAVENOUS
  Filled 2020-10-21: qty 19

## 2020-10-21 MED ORDER — PALONOSETRON HCL INJECTION 0.25 MG/5ML
0.2500 mg | Freq: Once | INTRAVENOUS | Status: AC
  Administered 2020-10-21: 0.25 mg via INTRAVENOUS

## 2020-10-21 MED ORDER — DIPHENHYDRAMINE HCL 25 MG PO CAPS
ORAL_CAPSULE | ORAL | Status: AC
  Filled 2020-10-21: qty 2

## 2020-10-21 MED ORDER — HEPARIN SOD (PORK) LOCK FLUSH 100 UNIT/ML IV SOLN
500.0000 [IU] | Freq: Once | INTRAVENOUS | Status: AC | PRN
  Administered 2020-10-21: 500 [IU]

## 2020-10-21 MED ORDER — HEPARIN SOD (PORK) LOCK FLUSH 100 UNIT/ML IV SOLN
500.0000 [IU] | Freq: Every day | INTRAVENOUS | Status: DC | PRN
Start: 2020-10-21 — End: 2020-10-21

## 2020-10-21 MED ORDER — PALONOSETRON HCL INJECTION 0.25 MG/5ML
INTRAVENOUS | Status: AC
  Filled 2020-10-21: qty 5

## 2020-10-21 MED ORDER — HEPARIN SOD (PORK) LOCK FLUSH 100 UNIT/ML IV SOLN
250.0000 [IU] | INTRAVENOUS | Status: DC | PRN
Start: 2020-10-21 — End: 2020-10-21

## 2020-10-21 MED ORDER — SODIUM CHLORIDE 0.9 % IV SOLN
375.0000 mg/m2 | Freq: Once | INTRAVENOUS | Status: AC
  Administered 2020-10-21: 600 mg via INTRAVENOUS
  Filled 2020-10-21: qty 50

## 2020-10-21 MED ORDER — ACETAMINOPHEN 325 MG PO TABS: 650.0000 mg | ORAL_TABLET | Freq: Once | ORAL | Status: AC

## 2020-10-21 MED ORDER — SODIUM CHLORIDE 0.9 % IV SOLN: Freq: Once | INTRAVENOUS | Status: AC

## 2020-10-21 MED ORDER — SODIUM CHLORIDE 0.9% FLUSH
10.0000 mL | INTRAVENOUS | Status: AC | PRN
  Administered 2020-10-21: 10 mL

## 2020-10-21 MED ORDER — SODIUM CHLORIDE 0.9% FLUSH: 10.0000 mL | INTRAVENOUS | Status: DC | PRN

## 2020-10-21 MED ORDER — SODIUM CHLORIDE 0.9 % IV SOLN
600.0000 mg/m2 | Freq: Once | INTRAVENOUS | Status: AC
  Administered 2020-10-21: 880 mg via INTRAVENOUS
  Filled 2020-10-21: qty 44

## 2020-10-21 MED ORDER — ACETAMINOPHEN 325 MG PO TABS
650.0000 mg | ORAL_TABLET | Freq: Once | ORAL | Status: AC
  Administered 2020-10-21: 650 mg via ORAL

## 2020-10-21 MED ORDER — VINCRISTINE SULFATE CHEMO INJECTION 1 MG/ML
2.0000 mg | Freq: Once | INTRAVENOUS | Status: AC
  Administered 2020-10-21: 2 mg via INTRAVENOUS
  Filled 2020-10-21: qty 2

## 2020-10-21 NOTE — Patient Instructions (Signed)
[place]  Discharge Instructions: Thank you for choosing [facility] to provide your oncology and hematology care.  If you have a lab appointment with the [facility], please come in thru the Main Entrance and check in at the main information desk.  Wear comfortable clothing and clothing appropriate for easy access to any Portacath or PICC line.   We strive to give you quality time with your provider. You may need to reschedule your appointment if you arrive late (15 or more minutes).  Arriving late affects you and other patients whose appointments are after yours.  Also, if you miss three or more appointments without notifying the office, you may be dismissed from the clinic at the provider's discretion.      For prescription refill requests, have your pharmacy contact our office and allow 72 hours for refills to be completed.    Today you received the following chemotherapy and/or immunotherapy agents R-CHOP infusions   To help prevent nausea and vomiting after your treatment, we encourage you to take your nausea medication as directed.  BELOW ARE SYMPTOMS THAT SHOULD BE REPORTED IMMEDIATELY: . *FEVER GREATER THAN 100.4 F (38 C) OR HIGHER . *CHILLS OR SWEATING . *NAUSEA AND VOMITING THAT IS NOT CONTROLLED WITH YOUR NAUSEA MEDICATION . *UNUSUAL SHORTNESS OF BREATH . *UNUSUAL BRUISING OR BLEEDING . *URINARY PROBLEMS (pain or burning when urinating, or frequent urination) . *BOWEL PROBLEMS (unusual diarrhea, constipation, pain near the anus) . TENDERNESS IN MOUTH AND THROAT WITH OR WITHOUT PRESENCE OF ULCERS (sore throat, sores in mouth, or a toothache) . UNUSUAL RASH, SWELLING OR PAIN  . UNUSUAL VAGINAL DISCHARGE OR ITCHING   Items with * indicate a potential emergency and should be followed up as soon as possible or go to the Emergency Department if any problems should occur.  Please show the CHEMOTHERAPY ALERT CARD or IMMUNOTHERAPY ALERT CARD at check-in  to the Emergency Department and triage nurse.  Should you have questions after your visit or need to cancel or reschedule your appointment, please contact [facility] [phone]  and follow the prompts.  Office hours are 8:00 a.m. to 4:30 p.m. Monday - Friday. Please note that voicemails left after 4:00 p.m. may not be returned until the following business day.  We are closed weekends and major holidays. You have access to a nurse at all times for urgent questions. Please call the main number to the clinic [phone] and follow the prompts.  For any non-urgent questions, you may also contact your provider using MyChart. We now offer e-Visits for anyone 85 and older to request care online for non-urgent symptoms. For details visit [email or web address].   Also download the MyChart app! Go to the app store, search "MyChart", open the app, select [facility], and log in with your MyChart username and password.  Due to Covid, a mask is required upon entering the hospital/clinic. If you do not have a mask, one will be given to you upon arrival. For doctor visits, patients may have 1 support person aged 23 or older with them. For treatment visits, patients cannot have anyone with them due to current Covid guidelines and our immunocompromised population.

## 2020-10-21 NOTE — Progress Notes (Signed)
Patients port flushed without difficulty.  Good blood return noted with no bruising or swelling noted at site. VVS.  Stable during access and blood draw.  Patient to remain accessed for treatment. 

## 2020-10-21 NOTE — Progress Notes (Signed)
Patient presents today for Rituximab, Adriamycin, Vincristine, Cytoxan infusions.  Vital signs within patameters for treatment.  Labs pending.  No new complaints since last treatment.  R-CHOP infusions given today per MD orders.  Tolerated infusion without adverse affects.  Vital signs stable.  No complaints at this time.  Discharge from clinic ambulatory in stable condition.  Alert and oriented X 3.  Follow up with [facility] as scheduled.

## 2020-10-21 NOTE — Progress Notes (Signed)
[place], [place] [address]   CLINIC:  Medical Oncology/Hematology  PCP:  Asencion Noble, MD [address] / [place] [address] [phone]   REASON FOR VISIT:  Follow-up for DLBCL  PRIOR THERAPY: None  NGS Results: Not done  CURRENT THERAPY: R-CHOP & Aloxi every 3 weeks  BRIEF ONCOLOGIC HISTORY:  Oncology History  DLBCL (diffuse large B cell lymphoma) ([facility])  08/29/2020 Initial Diagnosis   DLBCL (diffuse large B cell lymphoma) ([facility])   08/29/2020 Cancer Staging   Staging form: Hodgkin and Non-Hodgkin Lymphoma, AJCC 8th Edition - Clinical stage from 08/29/2020: Stage III - Signed by Derek Jack, MD on 08/29/2020 Stage prefix: Initial diagnosis   09/09/2020 -  Chemotherapy    Patient is on Treatment Plan: NON-HODGKINS LYMPHOMA R-CHOP Q21D        CANCER STAGING: Cancer Staging DLBCL (diffuse large B cell lymphoma) ([facility]) Staging form: Hodgkin and Non-Hodgkin Lymphoma, AJCC 8th Edition - Clinical stage from 08/29/2020: Stage III - Signed by Derek Jack, MD on 08/29/2020   INTERVAL HISTORY:  [name], a 80 y.o. female, returns for routine follow-up and consideration for next cycle of chemotherapy. Erica Mora was last seen on 09/30/2020.  Due for cycle #3 of R-CHOP and Aloxi today.   Today she is accompanied by her husband. Overall, she tells me she has been feeling fair. She complains of having no energy and her hair is starting to fall out, but denies having N/V/D. She tries to stay active around the house. Her leg swelling is improving and she is keeping them wrapped up; she reports that they have started oozing. She reports that her upper legs are still swollen. She forgot to take her prednisone today but will take 100 mg for 5 days after today. Her appetite is decreased but she is forcing herself to eat; she is eating soups and is trying to eat more protein.  Overall, she feels ready for next cycle  of chemo today.    REVIEW OF SYSTEMS:  Review of Systems  Constitutional: Positive for appetite change (50%), fatigue (depleted) and unexpected weight change (lost 3 lbs in 3 weeks).  Respiratory: Positive for shortness of breath (w/ exertion).   Cardiovascular: Positive for leg swelling.  Gastrointestinal: Negative for diarrhea, nausea and vomiting.  All other systems reviewed and are negative.   PAST MEDICAL/SURGICAL HISTORY:  Past Medical History:  Diagnosis Date  . Port-A-Cath in place 09/03/2020  . Prolapse of female bladder, acquired 1973   Past Surgical History:  Procedure Laterality Date  . EYE SURGERY     cataracts- bilateral    . INGUINAL LYMPH NODE BIOPSY Left 08/23/2020   Procedure: INGUINAL LYMPH NODE BIOPSY, LEFT;  Surgeon: Virl Cagey, MD;  Location: [facility];  Service: General;  Laterality: Left;  . ORIF HUMERUS FRACTURE Left 08/02/2015   Procedure: OPEN REDUCTION INTERNAL FIXATION (ORIF)LEFT DISTAL RADIUS,HUMERUS FRACTURE;  Surgeon: Altamese Grawn, MD;  Location: [facility];  Service: Orthopedics;  Laterality: Left;  . PORTACATH PLACEMENT Left 09/06/2020   Procedure: INSERTION PORT-A-CATH;  Surgeon: Virl Cagey, MD;  Location: [facility];  Service: General;  Laterality: Left;  . TUBAL LIGATION    . VAGINAL DELIVERY      SOCIAL HISTORY:  Social History   Socioeconomic History  . Marital status: Married    Spouse name: Not on file  . Number of children: Not on file  . Years of education: Not on file  .  Highest education level: Not on file  Occupational History  . Not on file  Tobacco Use  . Smoking status: Never Smoker  . Smokeless tobacco: Never Used  Vaping Use  . Vaping Use: Never used  Substance and Sexual Activity  . Alcohol use: Yes    Comment: wine glasses per day - 2 per day  . Drug use: No  . Sexual activity: Not Currently  Other Topics Concern  . Not on file  Social History Narrative  . Not on file   Social Determinants of Health    Financial Resource Strain: Low Risk   . Difficulty of Paying Living Expenses: Not hard at all  Food Insecurity: No Food Insecurity  . Worried About Charity fundraiser in the Last Year: Never true  . Ran Out of Food in the Last Year: Never true  Transportation Needs: No Transportation Needs  . Lack of Transportation (Medical): No  . Lack of Transportation (Non-Medical): No  Physical Activity: Inactive  . Days of Exercise per Week: 0 days  . Minutes of Exercise per Session: 0 min  Stress: No Stress Concern Present  . Feeling of Stress : Only a little  Social Connections: Moderately Integrated  . Frequency of Communication with Friends and Family: More than three times a week  . Frequency of Social Gatherings with Friends and Family: More than three times a week  . Attends Religious Services: More than 4 times per year  . Active Member of Clubs or Organizations: No  . Attends Archivist Meetings: Never  . Marital Status: Married  Human resources officer Violence: Not At Risk  . Fear of Current or Ex-Partner: No  . Emotionally Abused: No  . Physically Abused: No  . Sexually Abused: No    FAMILY HISTORY:  No family history on file.  CURRENT MEDICATIONS:  Current Outpatient Medications  Medication Sig Dispense Refill  . allopurinol (ZYLOPRIM) 300 MG tablet Take 1 tablet (300 mg total) by mouth daily. 30 tablet 2  . ALPRAZolam (XANAX) 0.25 MG tablet Take 1 tablet (0.25 mg total) by mouth at bedtime as needed for anxiety. 30 tablet 3  . CYCLOPHOSPHAMIDE IV Inject into the vein every 21 ( twenty-one) days.    Marland Kitchen DOXORUBICIN HCL IV Inject into the vein every 21 ( twenty-one) days.    . Doxylamine Succinate, Sleep, (SLEEP AID PO) Take 1 tablet by mouth at bedtime.    . hydrOXYzine (ATARAX/VISTARIL) 10 MG tablet Take 10 mg by mouth 3 (three) times daily as needed for anxiety or itching.    . naproxen sodium (ALEVE) 220 MG tablet Take 110 mg by mouth 2 (two) times daily as needed  (pain).    . ondansetron (ZOFRAN) 4 MG tablet Take 1 tablet (4 mg total) by mouth every 8 (eight) hours as needed. 30 tablet 1  . oxyCODONE (ROXICODONE) 5 MG immediate release tablet Take 1 tablet (5 mg total) by mouth every 4 (four) hours as needed for severe pain or breakthrough pain. 5 tablet 0  . potassium chloride (KLOR-CON) 10 MEQ tablet Take 1 tablet (10 mEq total) by mouth daily. Take 2 tablets by mouth once daily. 60 tablet 1  . potassium chloride (KLOR-CON) 10 MEQ tablet Take 20 mEq by mouth daily.    . predniSONE (DELTASONE) 20 MG tablet Take 5 tablets (100 mg) by mouth with breakfast daily on Days 1-5 of chemotherapy treatments 150 tablet 0  . RITUXIMAB IV Inject into the vein every 21 (  twenty-one) days.    Marland Kitchen VINCRISTINE SULFATE IV Inject into the vein every 21 ( twenty-one) days.    Marland Kitchen lidocaine-prilocaine (EMLA) cream Apply a small amount to port a cath site and cover with plastic wrap 1 hour prior to infusion appointments. (Patient not taking: Reported on 10/21/2020) 30 g 3  . prochlorperazine (COMPAZINE) 10 MG tablet Take 1 tablet (10 mg total) by mouth every 6 (six) hours as needed (Nausea or vomiting). (Patient not taking: Reported on 10/21/2020) 30 tablet 6   No current facility-administered medications for this visit.   Facility-Administered Medications Ordered in Other Visits  Medication Dose Route Frequency Provider Last Rate Last Admin  . 0.9 %  sodium chloride infusion (Manually program via Guardrails IV Fluids)  250 mL Intravenous Once Derek Jack, MD      . acetaminophen (TYLENOL) tablet 650 mg  650 mg Oral Once Derek Jack, MD      . cyclophosphamide (CYTOXAN) 880 mg in sodium chloride 0.9 % 250 mL chemo infusion  600 mg/m2 (Treatment Plan Recorded) Intravenous Once Derek Jack, MD      . dexamethasone (DECADRON) 10 mg in sodium chloride 0.9 % 50 mL IVPB  10 mg Intravenous Once Derek Jack, MD 204 mL/hr at 10/21/20 1040 10 mg at 10/21/20  1040  . diphenhydrAMINE (BENADRYL) capsule 25 mg  25 mg Oral Once Derek Jack, MD      . DOXOrubicin (ADRIAMYCIN) chemo injection 38 mg  25 mg/m2 (Treatment Plan Recorded) Intravenous Once Derek Jack, MD      . fosaprepitant (EMEND) 150 mg in sodium chloride 0.9 % 145 mL IVPB  150 mg Intravenous Once Derek Jack, MD      . heparin lock flush 100 unit/mL  500 Units Intracatheter Daily PRN Derek Jack, MD      . heparin lock flush 100 unit/mL  250 Units Intracatheter PRN Derek Jack, MD      . heparin lock flush 100 unit/mL  500 Units Intracatheter Once PRN Derek Jack, MD      . riTUXimab-pvvr (RUXIENCE) 600 mg in sodium chloride 0.9 % 250 mL (1.9355 mg/mL) infusion  375 mg/m2 (Treatment Plan Recorded) Intravenous Once Derek Jack, MD      . sodium chloride flush (NS) 0.9 % injection 10 mL  10 mL Intracatheter PRN Derek Jack, MD      . sodium chloride flush (NS) 0.9 % injection 10 mL  10 mL Intracatheter PRN Derek Jack, MD      . sodium chloride flush (NS) 0.9 % injection 3 mL  3 mL Intracatheter PRN Derek Jack, MD      . vinCRIStine (ONCOVIN) 2 mg in sodium chloride 0.9 % 50 mL chemo infusion  2 mg Intravenous Once Derek Jack, MD        ALLERGIES:  No Known Allergies  PHYSICAL EXAM:  Performance status (ECOG): 1 - Symptomatic but completely ambulatory  Vitals:   10/21/20 0842  BP: (!) 150/68  Pulse: 97  Resp: 16  Temp: (!) 96.8 F (36 C)  SpO2: 100%   Wt Readings from Last 3 Encounters:  10/21/20 110 lb 0.2 oz (49.9 kg)  09/30/20 113 lb 9.6 oz (51.5 kg)  09/16/20 117 lb 12.8 oz (53.4 kg)   Physical Exam Vitals reviewed.  Constitutional:      Appearance: Normal appearance.  Cardiovascular:     Rate and Rhythm: Normal rate and regular rhythm.     Pulses: Normal pulses.     Heart sounds: Normal heart  sounds.  Pulmonary:     Effort: Pulmonary effort is normal.     Breath  sounds: Normal breath sounds.  Abdominal:     Palpations: Abdomen is soft. There is no hepatomegaly or mass.     Tenderness: There is no abdominal tenderness.  Musculoskeletal:     Right lower leg: Edema present.     Left lower leg: Edema present.  Neurological:     General: No focal deficit present.     Mental Status: She is alert and oriented to person, place, and time.  Psychiatric:        Mood and Affect: Mood normal.        Behavior: Behavior normal.     LABORATORY DATA:  I have reviewed the labs as listed.  CBC Latest Ref Rng & Units 10/21/2020 09/30/2020 09/16/2020  WBC 4.0 - 10.5 K/uL 10.6(H) 10.3 0.6(LL)  Hemoglobin 12.0 - 15.0 g/dL 7.6(L) 8.7(L) 9.5(L)  Hematocrit 36.0 - 46.0 % 23.7(L) 26.4(L) 29.5(L)  Platelets 150 - 400 K/uL 302 237 115(L)   CMP Latest Ref Rng & Units 10/21/2020 09/30/2020 09/16/2020  Glucose 70 - 99 mg/dL 101(H) 120(H) 127(H)  BUN 8 - 23 mg/dL 10 10 12   Creatinine 0.44 - 1.00 mg/dL 0.68 0.63 0.64  Sodium 135 - 145 mmol/L 132(L) 129(L) 131(L)  Potassium 3.5 - 5.1 mmol/L 3.8 3.6 2.8(L)  Chloride 98 - 111 mmol/L 101 95(L) 93(L)  CO2 22 - 32 mmol/L 26 26 29   Calcium 8.9 - 10.3 mg/dL 8.2(L) 7.9(L) 8.2(L)  Total Protein 6.5 - 8.1 g/dL 5.4(L) 5.3(L) 5.8(L)  Total Bilirubin 0.3 - 1.2 mg/dL 0.5 0.4 0.4  Alkaline Phos 38 - 126 U/L 115 118 79  AST 15 - 41 U/L 14(L) 15 13(L)  ALT 0 - 44 U/L 8 8 11     DIAGNOSTIC IMAGING:  I have independently reviewed the scans and discussed with the patient. No results found.   ASSESSMENT:  1. Diffuse lymphadenopathy in retroperitoneal, pelvic and inguinal regions: -Presentation with leg swelling startingend of October 2021. -Evaluated at urgent care on 05/15/2020 with leg Doppler-no DVT but showed enlarged lymph nodes in the right groin. -Was evaluated by Dr. Willey Blade and a CT scan [facility] was ordered. -CTAP on 07/31/2020 showed retroperitoneal, pelvic and inguinal adenopathy with mild compression fracture deformity of L1  age-indeterminate. Moderate left hydroureteronephrosis with prominent dilatation of the left renal pelvis. -PET scan on 08/26/2020 shows supraclavicular, thoracic, abdominal pelvic lymphadenopathy including dominant right inguinal lymph nodes. Spleen is normal without focal lesion. No focal hypermetabolic activity to suggest bone mets. Bladder prolapse with associated moderate left hydroureteronephrosis and superimposed large extrarenal pelvis. 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 -Bone marrow biopsy on 09/04/2020 - for lymphomatous involvement. -2D echo on 09/05/2020 with EF 70-75%. -Cycle 1 of R-CHOP on 09/09/2020.  2. Social/family history: -She worked as a Art therapist prior to retirement. She was a never smoker. -She drinks wine occasionally. -Sister died of ovarian cancer at age 31. Mother died of possible pancreatic cancer.  3. Left hydroureteronephrosis: -She will follow up with Dr. Alyson Ingles for bladder prolapse and associated left hydroureteronephrosis.   PLAN:  1. 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 - Reviewed her labs today which showed normal LFTs.  Albumin is low at 2.3.  Hemoglobin is 7.6 from myelosuppression.  She was recommended to increase protein intake. - Recommend 1 unit of blood transfusion. - She will take prednisone 100 mg daily for 5 days. - We will refer for dietary evaluation. - She will proceed with her next cycle of treatment today.  I plan to repeat PET scan after this cycle.  RTC 3 weeks.  2. Generalized itching: -Itching has improved after first cycle of treatment.  Not requiring hydroxyzine.  3.Hypokalemia: -Continue potassium 20 mEq daily.  Potassium today is normal.  4. TLS prophylaxis: -Continue allopurinol  daily.  5. Anxiety: -Continue Xanax 0.25 mg half tablet at bedtime.   Orders placed this encounter:  Orders Placed This Encounter  Procedures  . NM PET Image Restag (PS) Skull Base To Thigh     Derek Jack, MD [facility] [phone]   I, Milinda Antis, am acting as a scribe for Dr. Sanda Linger.  I, Derek Jack MD, have reviewed the above documentation for accuracy and completeness, and I agree with the above.

## 2020-10-21 NOTE — Progress Notes (Signed)
Nutrition Assessment   Reason for Assessment: Provider request   ASSESSMENT: 80 year old female with Non-Hodgkin lymphoma. She is receiving R-CHOP  Met with patient in infusion. She reports not having a "big appetite" at baseline. Patient likes to "graze" and "eats when hungry" Patient reports usually cooking 2 meals/day for her husband, sometimes eating what she prepares. Patient enjoys snacking on fruits (oranges, strawberries, bananas) and vegetables. She likes deviled eggs, would like an omelette but requires too much effort to make. Patient reports husband "only knows one burner setting, and that is med/high" Patient eats cheese, slaw, popcorn, nuts, salads, and "loves anything pasta" Patient reports family/friends have been bringing over lots of food. Yesterday she ate homemade vegetable soup and some ham. She has a broccoli casserole in her freezer that she is planning to heat up for dinner. Patient does not like milk, but she does like ice cream. Patient drinks 3 (8 oz) bottles of water and green tea. She denies nausea, vomiting, diarrhea, constipation.   Medications: Klor-con, Prednisone, Compazine, Xanax, Zofran   Labs: Na 132, Glucose 101, Albumin 2.3   Anthropometrics: Weight 110 lb 0.2 oz today decreased 3 lbs in the last 3 weeks and down 7 lbs over the last month; significant  Height: 5' Weight: 49.9 kg UBW: 115 lb per pt BMI: 21.48   NUTRITION DIAGNOSIS: Inadequate oral intake related to NHL on related treatments as evidenced by 3% weight loss in 3 weeks; significant  INTERVENTION:  Educated on the importance of adequate calories and protein intake to maintain weights, strength, and nutritional status throughout treatment Discussed ways to add protein and calories, handout provided Encouraged small frequent meals and snacks, trying to eat every 3 hours, high calorie, high protein snack ideas provided Discussed foods with protein, encouraged to have with each meal and  snack, handout provided Non-dairy smoothie recipes given Samples of Boost Breeze, Ensure Clear, Orgain Protein Powder  Ensure coupons given RD contact information provided   MONITORING, EVALUATION, GOAL: Patient will tolerate increased calories and protein to maintain weights during treatment   Next Visit: Thursday June 2 via telephone

## 2020-10-21 NOTE — Progress Notes (Signed)
Patient assessed and labs reviewed by Dr Delton Coombes.  No acute distress noted.  [place] for treatment today.

## 2020-10-21 NOTE — Patient Instructions (Signed)
[place] at [facility] Discharge Instructions  You were seen today by Dr. Delton Coombes. He went over your recent results. You received your treatment and a blood transfusion today. You will be scheduled to have a PET scan prior to your next visit. You will be referred to the dietician to improve your weight and energy levels. Dr. Delton Coombes will see you back in 3 weeks for labs and follow up.   Thank you for choosing [facility] at [facility] to provide your oncology and hematology care.  To afford each patient quality time with our provider, please arrive at least 15 minutes before your scheduled appointment time.   If you have a lab appointment with the [facility] please come in thru the Main Entrance and check in at the main information desk  You need to re-schedule your appointment should you arrive 10 or more minutes late.  We strive to give you quality time with our providers, and arriving late affects you and other patients whose appointments are after yours.  Also, if you no show three or more times for appointments you may be dismissed from the clinic at the providers discretion.     Again, thank you for choosing [facility].  Our hope is that these requests will decrease the amount of time that you wait before being seen by our physicians.       _____________________________________________________________  Should you have questions after your visit to [facility], please contact our office at [phone] between the hours of 8:00 a.m. and 4:30 p.m.  Voicemails left after 4:00 p.m. will not be returned until the following business day.  For prescription refill requests, have your pharmacy contact our office and allow 72 hours.    Cancer Center Support Programs:   > Cancer Support Group  2nd Tuesday of the month 1pm-2pm, Journey Room

## 2020-10-22 ENCOUNTER — Inpatient Hospital Stay (HOSPITAL_COMMUNITY): Payer: Medicare Other

## 2020-10-22 VITALS — BP 148/68 | HR 71 | Temp 97.1°F | Resp 16

## 2020-10-22 DIAGNOSIS — Z5112 Encounter for antineoplastic immunotherapy: Secondary | ICD-10-CM | POA: Diagnosis not present

## 2020-10-22 DIAGNOSIS — D500 Iron deficiency anemia secondary to blood loss (chronic): Secondary | ICD-10-CM

## 2020-10-22 MED ORDER — ACETAMINOPHEN 325 MG PO TABS
650.0000 mg | ORAL_TABLET | Freq: Once | ORAL | Status: AC
  Administered 2020-10-22: 650 mg via ORAL
  Filled 2020-10-22: qty 2

## 2020-10-22 MED ORDER — DIPHENHYDRAMINE HCL 25 MG PO CAPS
25.0000 mg | ORAL_CAPSULE | Freq: Once | ORAL | Status: AC
  Administered 2020-10-22: 25 mg via ORAL
  Filled 2020-10-22: qty 1

## 2020-10-22 MED ORDER — SODIUM CHLORIDE 0.9% IV SOLUTION
250.0000 mL | Freq: Once | INTRAVENOUS | Status: AC
  Administered 2020-10-22: 250 mL via INTRAVENOUS

## 2020-10-22 MED ORDER — SODIUM CHLORIDE 0.9% FLUSH
10.0000 mL | INTRAVENOUS | Status: AC | PRN
  Administered 2020-10-22: 10 mL

## 2020-10-22 MED ORDER — HEPARIN SOD (PORK) LOCK FLUSH 100 UNIT/ML IV SOLN
500.0000 [IU] | Freq: Every day | INTRAVENOUS | Status: AC | PRN
  Administered 2020-10-22: 500 [IU]

## 2020-10-22 NOTE — Patient Instructions (Signed)
Erica Mora  Discharge Instructions: Thank you for choosing [facility] to provide your oncology and hematology care.  If you have a lab appointment with the [facility], please come in thru the Main Entrance and check in at the main information desk.  Wear comfortable clothing and clothing appropriate for easy access to any Portacath or PICC line.   We strive to give you quality time with your provider. You may need to reschedule your appointment if you arrive late (15 or more minutes).  Arriving late affects you and other patients whose appointments are after yours.  Also, if you miss three or more appointments without notifying the office, you may be dismissed from the clinic at the provider's discretion.      For prescription refill requests, have your pharmacy contact our office and allow 72 hours for refills to be completed.    Today you received one unit of blood.   BELOW ARE SYMPTOMS THAT SHOULD BE REPORTED IMMEDIATELY: . *FEVER GREATER THAN 100.4 F (38 C) OR HIGHER . *CHILLS OR SWEATING . *NAUSEA AND VOMITING THAT IS NOT CONTROLLED WITH YOUR NAUSEA MEDICATION . *UNUSUAL SHORTNESS OF BREATH . *UNUSUAL BRUISING OR BLEEDING . *URINARY PROBLEMS (pain or burning when urinating, or frequent urination) . *BOWEL PROBLEMS (unusual diarrhea, constipation, pain near the anus) . TENDERNESS IN MOUTH AND THROAT WITH OR WITHOUT PRESENCE OF ULCERS (sore throat, sores in mouth, or a toothache) . UNUSUAL RASH, SWELLING OR PAIN  . UNUSUAL VAGINAL DISCHARGE OR ITCHING   Items with * indicate a potential emergency and should be followed up as soon as possible or go to the Emergency Department if any problems should occur.  Please show the CHEMOTHERAPY ALERT CARD or IMMUNOTHERAPY ALERT CARD at check-in to the Emergency Department and triage nurse.  Should you have questions after your visit or need to cancel or reschedule your appointment, please contact [facility] [phone]  and follow the prompts.  Office hours are 8:00 a.m. to 4:30 p.m. Monday - Friday. Please note that voicemails left after 4:00 p.m. may not be returned until the following business day.  We are closed weekends and major holidays. You have access to a nurse at all times for urgent questions. Please call the main number to the clinic [phone] and follow the prompts.  For any non-urgent questions, you may also contact your provider using MyChart. We now offer e-Visits for anyone 37 and older to request care online for non-urgent symptoms. For details visit [email or web address].   Also download the MyChart app! Go to the app store, search "MyChart", open the app, select [facility], and log in with your MyChart username and password.  Due to Covid, a mask is required upon entering the hospital/clinic. If you do not have a mask, one will be given to you upon arrival. For doctor visits, patients may have 1 support person aged 48 or older with them. For treatment visits, patients cannot have anyone with them due to current Covid guidelines and our immunocompromised population.

## 2020-10-22 NOTE — Progress Notes (Signed)
Patient presents today for 1 unit of PRBC's. Vitals signs stable. Patient has no complaints of any changes since her last visit. MAR reviewed and updated.   1 Unit of PRBC's given today per MD orders. Tolerated infusion without adverse affects. Vital signs stable. No complaints at this time. Discharged from clinic ambulatory in stable condition. Alert and oriented x 3. F/U with [facility] as scheduled.

## 2020-10-23 ENCOUNTER — Other Ambulatory Visit: Payer: Self-pay

## 2020-10-23 ENCOUNTER — Encounter (HOSPITAL_COMMUNITY): Payer: Self-pay

## 2020-10-23 ENCOUNTER — Inpatient Hospital Stay (HOSPITAL_COMMUNITY): Payer: Medicare Other

## 2020-10-23 VITALS — BP 136/66 | HR 98 | Temp 97.0°F | Resp 18

## 2020-10-23 DIAGNOSIS — Z5112 Encounter for antineoplastic immunotherapy: Secondary | ICD-10-CM | POA: Diagnosis not present

## 2020-10-23 DIAGNOSIS — Z95828 Presence of other vascular implants and grafts: Secondary | ICD-10-CM

## 2020-10-23 DIAGNOSIS — C8330 Diffuse large B-cell lymphoma, unspecified site: Secondary | ICD-10-CM

## 2020-10-23 LAB — TYPE AND SCREEN
ABO/RH(D): O POS
Antibody Screen: NEGATIVE
Unit division: 0

## 2020-10-23 LAB — BPAM RBC
Blood Product Expiration Date: 202206112359
ISSUE DATE / TIME: 202205100927
Unit Type and Rh: 5100

## 2020-10-23 MED ORDER — PEGFILGRASTIM-JMDB 6 MG/0.6ML ~~LOC~~ SOSY
6.0000 mg | PREFILLED_SYRINGE | Freq: Once | SUBCUTANEOUS | Status: AC
  Administered 2020-10-23: 6 mg via SUBCUTANEOUS

## 2020-10-23 NOTE — Progress Notes (Signed)
Pt here for fulphia today.  Vital signs WNL for injection.   Erica Mora presents today for injection per the provider's orders.  fulphia 6 mg in right arm administration without incident; injection site WNL; see MAR for injection details.  Patient tolerated procedure well and without incident.  No questions or complaints noted at this time.  Stable during and after injection.  AVS reviewed.  Discharged in stable condition ambulatory.

## 2020-10-23 NOTE — Patient Instructions (Signed)
[facility]  Discharge Instructions: Thank you for choosing [facility] to provide your oncology and hematology care.  If you have a lab appointment with the [facility], please come in thru the Main Entrance and check in at the main information desk.  Wear comfortable clothing and clothing appropriate for easy access to any Portacath or PICC line.   We strive to give you quality time with your provider. You may need to reschedule your appointment if you arrive late (15 or more minutes).  Arriving late affects you and other patients whose appointments are after yours.  Also, if you miss three or more appointments without notifying the office, you may be dismissed from the clinic at the provider's discretion.      For prescription refill requests, have your pharmacy contact our office and allow 72 hours for refills to be completed.    fulphia today.   To help prevent nausea and vomiting after your treatment, we encourage you to take your nausea medication as directed.  BELOW ARE SYMPTOMS THAT SHOULD BE REPORTED IMMEDIATELY: . *FEVER GREATER THAN 100.4 F (38 C) OR HIGHER . *CHILLS OR SWEATING . *NAUSEA AND VOMITING THAT IS NOT CONTROLLED WITH YOUR NAUSEA MEDICATION . *UNUSUAL SHORTNESS OF BREATH . *UNUSUAL BRUISING OR BLEEDING . *URINARY PROBLEMS (pain or burning when urinating, or frequent urination) . *BOWEL PROBLEMS (unusual diarrhea, constipation, pain near the anus) . TENDERNESS IN MOUTH AND THROAT WITH OR WITHOUT PRESENCE OF ULCERS (sore throat, sores in mouth, or a toothache) . UNUSUAL RASH, SWELLING OR PAIN  . UNUSUAL VAGINAL DISCHARGE OR ITCHING   Items with * indicate a potential emergency and should be followed up as soon as possible or go to the Emergency Department if any problems should occur.  Please show the CHEMOTHERAPY ALERT CARD or IMMUNOTHERAPY ALERT CARD at check-in to the Emergency Department and triage nurse.  Should you have questions  after your visit or need to cancel or reschedule your appointment, please contact [facility] [phone]  and follow the prompts.  Office hours are 8:00 a.m. to 4:30 p.m. Monday - Friday. Please note that voicemails left after 4:00 p.m. may not be returned until the following business day.  We are closed weekends and major holidays. You have access to a nurse at all times for urgent questions. Please call the main number to the clinic [phone] and follow the prompts.  For any non-urgent questions, you may also contact your provider using MyChart. We now offer e-Visits for anyone 18 and older to request care online for non-urgent symptoms. For details visit [email or web address].   Also download the MyChart app! Go to the app store, search "MyChart", open the app, select [facility], and log in with your MyChart username and password.  Due to Covid, a mask is required upon entering the hospital/clinic. If you do not have a mask, one will be given to you upon arrival. For doctor visits, patients may have 1 support person aged 75 or older with them. For treatment visits, patients cannot have anyone with them due to current Covid guidelines and our immunocompromised population.

## 2020-11-04 ENCOUNTER — Encounter (HOSPITAL_COMMUNITY)
Admission: RE | Admit: 2020-11-04 | Discharge: 2020-11-04 | Disposition: A | Discharge status: Home / Self Care | Payer: Medicare Other | Source: Ambulatory Visit | Attending: Hematology | Admitting: Hematology

## 2020-11-04 ENCOUNTER — Other Ambulatory Visit: Payer: Self-pay

## 2020-11-04 DIAGNOSIS — C8338 Diffuse large B-cell lymphoma, lymph nodes of multiple sites: Secondary | ICD-10-CM | POA: Diagnosis present

## 2020-11-04 MED ORDER — FLUDEOXYGLUCOSE F - 18 (FDG) INJECTION
5.5000 | Freq: Once | INTRAVENOUS | Status: AC | PRN
  Administered 2020-11-04: 5.5 via INTRAVENOUS

## 2020-11-07 ENCOUNTER — Telehealth (HOSPITAL_COMMUNITY): Payer: Self-pay | Admitting: *Deleted

## 2020-11-07 NOTE — Telephone Encounter (Signed)
Patient's daughter in law called requesting an earlier appointment than 6/1, as patient is showing a decline in status.  Has been having frequent falls and a decrease in appetite.  Advised that we have no available appointments.  Option given to take her to the emergency room if family feels like she needs additional medical treatment prior to visit.  Made aware that further information could not be shared with her, as she is not listed as a contact for Ms. Vestal.  Questioned as to wether hospice or home health could be an option for her, however made her aware that this would have to be a decision that the patient would have to make for herself.

## 2020-11-08 ENCOUNTER — Emergency Department (HOSPITAL_COMMUNITY): Payer: Medicare Other

## 2020-11-08 ENCOUNTER — Inpatient Hospital Stay (HOSPITAL_COMMUNITY)
Admission: EM | Admit: 2020-11-08 | Discharge: 2020-11-13 | DRG: 840 | Disposition: E | Payer: Medicare Other | Attending: Internal Medicine | Admitting: Internal Medicine

## 2020-11-08 ENCOUNTER — Encounter (HOSPITAL_COMMUNITY): Payer: Self-pay

## 2020-11-08 ENCOUNTER — Telehealth (HOSPITAL_COMMUNITY): Payer: Self-pay | Admitting: *Deleted

## 2020-11-08 ENCOUNTER — Other Ambulatory Visit: Payer: Self-pay

## 2020-11-08 ENCOUNTER — Encounter (HOSPITAL_COMMUNITY): Payer: Self-pay | Admitting: Hematology

## 2020-11-08 ENCOUNTER — Observation Stay (HOSPITAL_COMMUNITY): Payer: Medicare Other

## 2020-11-08 DIAGNOSIS — G928 Other toxic encephalopathy: Secondary | ICD-10-CM | POA: Diagnosis present

## 2020-11-08 DIAGNOSIS — E876 Hypokalemia: Secondary | ICD-10-CM | POA: Diagnosis present

## 2020-11-08 DIAGNOSIS — S065X9A Traumatic subdural hemorrhage with loss of consciousness of unspecified duration, initial encounter: Secondary | ICD-10-CM | POA: Diagnosis present

## 2020-11-08 DIAGNOSIS — Z66 Do not resuscitate: Secondary | ICD-10-CM | POA: Diagnosis present

## 2020-11-08 DIAGNOSIS — L89626 Pressure-induced deep tissue damage of left heel: Secondary | ICD-10-CM | POA: Diagnosis present

## 2020-11-08 DIAGNOSIS — Z20822 Contact with and (suspected) exposure to covid-19: Secondary | ICD-10-CM | POA: Diagnosis present

## 2020-11-08 DIAGNOSIS — R0902 Hypoxemia: Secondary | ICD-10-CM

## 2020-11-08 DIAGNOSIS — R531 Weakness: Secondary | ICD-10-CM

## 2020-11-08 DIAGNOSIS — L89616 Pressure-induced deep tissue damage of right heel: Secondary | ICD-10-CM | POA: Diagnosis present

## 2020-11-08 DIAGNOSIS — S0083XA Contusion of other part of head, initial encounter: Secondary | ICD-10-CM | POA: Diagnosis present

## 2020-11-08 DIAGNOSIS — Z515 Encounter for palliative care: Secondary | ICD-10-CM

## 2020-11-08 DIAGNOSIS — E871 Hypo-osmolality and hyponatremia: Secondary | ICD-10-CM | POA: Diagnosis present

## 2020-11-08 DIAGNOSIS — J9601 Acute respiratory failure with hypoxia: Secondary | ICD-10-CM | POA: Diagnosis present

## 2020-11-08 DIAGNOSIS — I248 Other forms of acute ischemic heart disease: Secondary | ICD-10-CM | POA: Diagnosis present

## 2020-11-08 DIAGNOSIS — R64 Cachexia: Secondary | ICD-10-CM | POA: Diagnosis present

## 2020-11-08 DIAGNOSIS — R590 Localized enlarged lymph nodes: Secondary | ICD-10-CM | POA: Diagnosis present

## 2020-11-08 DIAGNOSIS — L89156 Pressure-induced deep tissue damage of sacral region: Secondary | ICD-10-CM | POA: Diagnosis present

## 2020-11-08 DIAGNOSIS — E872 Acidosis: Secondary | ICD-10-CM | POA: Diagnosis present

## 2020-11-08 DIAGNOSIS — D649 Anemia, unspecified: Secondary | ICD-10-CM

## 2020-11-08 DIAGNOSIS — R54 Age-related physical debility: Secondary | ICD-10-CM | POA: Diagnosis present

## 2020-11-08 DIAGNOSIS — Z681 Body mass index (BMI) 19 or less, adult: Secondary | ICD-10-CM

## 2020-11-08 DIAGNOSIS — Z9221 Personal history of antineoplastic chemotherapy: Secondary | ICD-10-CM

## 2020-11-08 DIAGNOSIS — C8330 Diffuse large B-cell lymphoma, unspecified site: Secondary | ICD-10-CM | POA: Diagnosis not present

## 2020-11-08 DIAGNOSIS — D72829 Elevated white blood cell count, unspecified: Secondary | ICD-10-CM | POA: Diagnosis present

## 2020-11-08 DIAGNOSIS — R627 Adult failure to thrive: Secondary | ICD-10-CM | POA: Diagnosis present

## 2020-11-08 DIAGNOSIS — D630 Anemia in neoplastic disease: Secondary | ICD-10-CM | POA: Diagnosis present

## 2020-11-08 DIAGNOSIS — E860 Dehydration: Secondary | ICD-10-CM | POA: Diagnosis present

## 2020-11-08 DIAGNOSIS — W1830XA Fall on same level, unspecified, initial encounter: Secondary | ICD-10-CM | POA: Diagnosis present

## 2020-11-08 DIAGNOSIS — S065XAA Traumatic subdural hemorrhage with loss of consciousness status unknown, initial encounter: Secondary | ICD-10-CM

## 2020-11-08 DIAGNOSIS — R778 Other specified abnormalities of plasma proteins: Secondary | ICD-10-CM

## 2020-11-08 DIAGNOSIS — Z79899 Other long term (current) drug therapy: Secondary | ICD-10-CM

## 2020-11-08 LAB — COMPREHENSIVE METABOLIC PANEL
ALT: 14 U/L (ref 0–44)
AST: 25 U/L (ref 15–41)
Albumin: 1.9 g/dL — ABNORMAL LOW (ref 3.5–5.0)
Alkaline Phosphatase: 115 U/L (ref 38–126)
Anion gap: 8 (ref 5–15)
BUN: 28 mg/dL — ABNORMAL HIGH (ref 8–23)
CO2: 28 mmol/L (ref 22–32)
Calcium: 7.2 mg/dL — ABNORMAL LOW (ref 8.9–10.3)
Chloride: 94 mmol/L — ABNORMAL LOW (ref 98–111)
Creatinine, Ser: 0.99 mg/dL (ref 0.44–1.00)
GFR, Estimated: 58 mL/min — ABNORMAL LOW (ref >60)
Glucose, Bld: 119 mg/dL — ABNORMAL HIGH (ref 70–99)
Potassium: 2.9 mmol/L — ABNORMAL LOW (ref 3.5–5.1)
Sodium: 130 mmol/L — ABNORMAL LOW (ref 135–145)
Total Bilirubin: 0.7 mg/dL (ref 0.3–1.2)
Total Protein: 5 g/dL — ABNORMAL LOW (ref 6.5–8.1)

## 2020-11-08 LAB — CBC WITH DIFFERENTIAL/PLATELET
Band Neutrophils: 8 %
Basophils Absolute: 0 10*3/uL (ref 0.0–0.1)
Basophils Relative: 0 %
Eosinophils Absolute: 0 10*3/uL (ref 0.0–0.5)
Eosinophils Relative: 0 %
HCT: 23.2 % — ABNORMAL LOW (ref 36.0–46.0)
Hemoglobin: 7.5 g/dL — ABNORMAL LOW (ref 12.0–15.0)
Lymphocytes Relative: 6 %
Lymphs Abs: 1.6 10*3/uL (ref 0.7–4.0)
MCH: 28 pg (ref 26.0–34.0)
MCHC: 32.3 g/dL (ref 30.0–36.0)
MCV: 86.6 fL (ref 80.0–100.0)
Metamyelocytes Relative: 4 %
Monocytes Absolute: 0.3 10*3/uL (ref 0.1–1.0)
Monocytes Relative: 1 %
Neutro Abs: 23.2 10*3/uL — ABNORMAL HIGH (ref 1.7–7.7)
Neutrophils Relative %: 81 %
Platelets: 209 10*3/uL (ref 150–400)
RBC: 2.68 MIL/uL — ABNORMAL LOW (ref 3.87–5.11)
RDW: 17.8 % — ABNORMAL HIGH (ref 11.5–15.5)
WBC: 26.1 10*3/uL — ABNORMAL HIGH (ref 4.0–10.5)
nRBC: 0.1 % (ref 0.0–0.2)

## 2020-11-08 LAB — LACTIC ACID, PLASMA: Lactic Acid, Venous: 2.2 mmol/L (ref 0.5–1.9)

## 2020-11-08 LAB — TROPONIN I (HIGH SENSITIVITY)
Troponin I (High Sensitivity): 131 ng/L (ref <18)
Troponin I (High Sensitivity): 138 ng/L (ref <18)

## 2020-11-08 LAB — RESP PANEL BY RT-PCR (FLU A&B, COVID) ARPGX2
Influenza A by PCR: NEGATIVE
Influenza B by PCR: NEGATIVE
SARS Coronavirus 2 by RT PCR: NEGATIVE

## 2020-11-08 LAB — PREPARE RBC (CROSSMATCH)

## 2020-11-08 MED ORDER — SODIUM CHLORIDE 0.9 % IV SOLN: INTRAVENOUS | Status: DC

## 2020-11-08 MED ORDER — MORPHINE SULFATE (PF) 2 MG/ML IV SOLN: 1.0000 mg | INTRAVENOUS | Status: DC | PRN

## 2020-11-08 MED ORDER — FUROSEMIDE 10 MG/ML IJ SOLN
20.0000 mg | Freq: Once | INTRAMUSCULAR | Status: AC
  Administered 2020-11-09: 20 mg via INTRAVENOUS
  Filled 2020-11-08: qty 2

## 2020-11-08 MED ORDER — POTASSIUM CHLORIDE CRYS ER 20 MEQ PO TBCR
40.0000 meq | EXTENDED_RELEASE_TABLET | Freq: Once | ORAL | Status: DC
  Filled 2020-11-08: qty 2

## 2020-11-08 MED ORDER — SODIUM CHLORIDE 0.9 % IV BOLUS
1000.0000 mL | Freq: Once | INTRAVENOUS | Status: AC
  Administered 2020-11-08: 1000 mL via INTRAVENOUS

## 2020-11-08 MED ORDER — SODIUM CHLORIDE 0.9% IV SOLUTION: Freq: Once | INTRAVENOUS | Status: AC

## 2020-11-08 MED ORDER — POTASSIUM CHLORIDE 10 MEQ/100ML IV SOLN
10.0000 meq | Freq: Once | INTRAVENOUS | Status: AC
  Administered 2020-11-08: 10 meq via INTRAVENOUS
  Filled 2020-11-08: qty 100

## 2020-11-08 MED ORDER — MORPHINE SULFATE (PF) 2 MG/ML IV SOLN
0.5000 mg | INTRAVENOUS | Status: DC | PRN
  Administered 2020-11-09 (×2): 0.5 mg via INTRAVENOUS
  Filled 2020-11-08 (×2): qty 1

## 2020-11-08 MED ORDER — HYDROCODONE-ACETAMINOPHEN 5-325 MG PO TABS: 1.0000 | ORAL_TABLET | Freq: Four times a day (QID) | ORAL | Status: DC | PRN

## 2020-11-08 MED ORDER — ENSURE ENLIVE PO LIQD
237.0000 mL | Freq: Two times a day (BID) | ORAL | Status: DC
  Filled 2020-11-08 (×6): qty 237

## 2020-11-08 NOTE — ED Triage Notes (Signed)
EMS called out for unresponsive, wouldn't respond to husband except to open eyes. Pt O2 sats on EMS arrival 50%, placed on 15L NR O2 sats came up to 97%, pt became responsive and started answering questions. Pt now on 6L [place]. CBG 110.

## 2020-11-08 NOTE — ED Notes (Signed)
Pt in bed, pt has bruising and eccymosis to face, md aware and states it is ok for covid swab.

## 2020-11-08 NOTE — H&P (Addendum)
TRH H&P   Patient Demographics:    Erica Mora, is a 80 y.o. female  MRN: [id number]   DOB - 12-22-1940  Admit Date - 11/03/2020  Outpatient Primary MD for the patient is Erica Noble, MD  Referring MD/NP/PA: Dr Roderic Palau  Outpatient Specialists: oncology Dr. Delton Coombes  Patient coming from: home  Chief Complaint  Patient presents with  . Shortness of Breath      HPI:    Erica Mora  is a 80 y.o. female, with recent diagnosis of diffuse large B-cell lymphoma, s/p Port-A-Cath placement, and currently on chemotherapy, patient presents to ED secondary to multiple complaints, including generalized weakness, falls, and shortness of breath, patient and husband at bedside report patient with poor appetite, not eating much for the last few days, urinary incontinence, as well some intermittent diarrhea, will report he tried to wake up up this morning, but she was very difficult to wake up, by the time she was in ED she was awake and appropriate, but she is extremely frail, report generalized weakness, nothing focal, no fever, no chills, no cough -In ED her work-up significant for 130, hypokalemia of 2.9, low albumin at 1.9, lactic acid at 2.2, white blood cell count of 26,000, hemoglobin at 7.5, ED physician discussed with hematology, report leukocytosis most likely related to Neulasta she received, CT head significant for small subdural hematoma due to fall she sustained couple days ago, has neurosurgery on-call who recommended repeat CT head in 24 hours, [facility] hospitalist consulted to admit.    Review of systems:     A full 10 point Review of Systems was done, except as stated above, all other Review of Systems were negative.   With Past History of the following :    Past Medical History:  Diagnosis Date  . Port-A-Cath in place 09/03/2020  . Prolapse of female bladder, acquired  1973      Past Surgical History:  Procedure Laterality Date  . EYE SURGERY     cataracts- bilateral    . INGUINAL LYMPH NODE BIOPSY Left 08/23/2020   Procedure: INGUINAL LYMPH NODE BIOPSY, LEFT;  Surgeon: Virl Cagey, MD;  Location: [facility];  Service: General;  Laterality: Left;  . ORIF HUMERUS FRACTURE Left 08/02/2015   Procedure: OPEN REDUCTION INTERNAL FIXATION (ORIF)LEFT DISTAL RADIUS,HUMERUS FRACTURE;  Surgeon: Altamese Taylor, MD;  Location: [facility];  Service: Orthopedics;  Laterality: Left;  . PORTACATH PLACEMENT Left 09/06/2020   Procedure: INSERTION PORT-A-CATH;  Surgeon: Virl Cagey, MD;  Location: [facility];  Service: General;  Laterality: Left;  . TUBAL LIGATION    . VAGINAL DELIVERY        Social History:     Social History   Tobacco Use  . Smoking status: Never Smoker  . Smokeless tobacco: Never Used  Substance Use Topics  . Alcohol use: Yes    Comment: wine glasses per day -  2 per day       Family History :   family history was reviewed, nonpertinent   Home Medications:   Prior to Admission medications   Medication Sig Start Date End Date Taking? Authorizing Provider  allopurinol (ZYLOPRIM) 300 MG tablet Take 1 tablet (300 mg total) by mouth daily. 08/29/20   Derek Jack, MD  ALPRAZolam Duanne Moron) 0.25 MG tablet Take 1 tablet (0.25 mg total) by mouth at bedtime as needed for anxiety. 10/01/20   Derek Jack, MD  CYCLOPHOSPHAMIDE IV Inject into the vein every 21 ( twenty-one) days. 09/09/20   [provider]  DOXORUBICIN HCL IV Inject into the vein every 21 ( twenty-one) days. 09/09/20   [provider]  Doxylamine Succinate, Sleep, (SLEEP AID PO) Take 1 tablet by mouth at bedtime.    [provider]  hydrOXYzine (ATARAX/VISTARIL) 10 MG tablet Take 10 mg by mouth 3 (three) times daily as needed for anxiety or itching. 08/09/20   [provider]  lidocaine-prilocaine (EMLA) cream Apply a small amount to port a  cath site and cover with plastic wrap 1 hour prior to infusion appointments. 09/03/20   Derek Jack, MD  naproxen sodium (ALEVE) 220 MG tablet Take 110 mg by mouth 2 (two) times daily as needed (pain).    [provider]  ondansetron (ZOFRAN) 4 MG tablet Take 1 tablet (4 mg total) by mouth every 8 (eight) hours as needed. 08/23/20 08/23/21  Virl Cagey, MD  oxyCODONE (ROXICODONE) 5 MG immediate release tablet Take 1 tablet (5 mg total) by mouth every 4 (four) hours as needed for severe pain or breakthrough pain. 09/06/20 09/06/21  Virl Cagey, MD  potassium chloride (KLOR-CON) 10 MEQ tablet Take 1 tablet (10 mEq total) by mouth daily. Take 2 tablets by mouth once daily. 09/16/20   Derek Jack, MD  potassium chloride (KLOR-CON) 10 MEQ tablet Take 20 mEq by mouth daily. 09/17/20   [provider]  predniSONE (DELTASONE) 20 MG tablet Take 5 tablets (100 mg) by mouth with breakfast daily on Days 1-5 of chemotherapy treatments 09/03/20   Derek Jack, MD  prochlorperazine (COMPAZINE) 10 MG tablet Take 1 tablet (10 mg total) by mouth every 6 (six) hours as needed (Nausea or vomiting). 09/03/20   Derek Jack, MD  RITUXIMAB IV Inject into the vein every 21 ( twenty-one) days. 09/09/20   [provider]  VINCRISTINE SULFATE IV Inject into the vein every 21 ( twenty-one) days. 09/09/20   [provider]     Allergies:    No Known Allergies   Physical Exam:   Vitals  Blood pressure 126/71, pulse (!) 101, temperature 98.3 F (36.8 C), temperature source Oral, resp. rate (!) 95, weight 46.2 kg, SpO2 95 %.   1. General Trembly frail, deconditioned, chronically ill-appearing, under nourished female, laying in bed in mild discomfort  2. Normal affect and insight, Not Suicidal or Homicidal, Awake Alert, Oriented X 3.  But easily distracted, slow to respond  3. No F.N deficits, ALL C.Nerves Intact, Strength 5/5 all 4 extremities,  Sensation intact all 4 extremities, Plantars down going.  4. Ears and Eyes appear Normal, Conjunctivae clear, PERRLA.  Dry oral mucosa, has some facial bruising due to recent fall in the chin and left nares area  5. Supple Neck, No JVD, No Carotid Bruits.  6. Symmetrical Chest wall movement, Good air movement bilaterally, CTAB.  7. RRR, No Gallops, Rubs or Murmurs, No Parasternal Heave.  +1 edema  8. Positive Bowel Sounds, Abdomen Soft, No tenderness, No organomegaly appriciated,No rebound -guarding or rigidity.  9.  No Cyanosis, Normal Skin Turgor, extremity wound bandaged  10. Good muscle tone,  joints appear normal , no effusions, Normal ROM.     Data Review:    CBC Recent Labs  Lab 10/23/2020 1551  WBC 26.1*  HGB 7.5*  HCT 23.2*  PLT 209  MCV 86.6  MCH 28.0  MCHC 32.3  RDW 17.8*  LYMPHSABS 1.6  MONOABS 0.3  EOSABS 0.0  BASOSABS 0.0   ------------------------------------------------------------------------------------------------------------------  Chemistries  Recent Labs  Lab 11/06/2020 1551  NA 130*  K 2.9*  CL 94*  CO2 28  GLUCOSE 119*  BUN 28*  CREATININE 0.99  CALCIUM 7.2*  AST 25  ALT 14  ALKPHOS 115  BILITOT 0.7   ------------------------------------------------------------------------------------------------------------------ estimated creatinine clearance is 32.6 mL/min (by C-G formula based on SCr of 0.99 mg/dL). ------------------------------------------------------------------------------------------------------------------ No results for input(s): TSH, T4TOTAL, T3FREE, THYROIDAB in the last 72 hours.  Invalid input(s): FREET3  Coagulation profile No results for input(s): INR, PROTIME in the last 168 hours. ------------------------------------------------------------------------------------------------------------------- No results for input(s): DDIMER in the last 72  hours. -------------------------------------------------------------------------------------------------------------------  Cardiac Enzymes No results for input(s): CKMB, TROPONINI, MYOGLOBIN in the last 168 hours.  Invalid input(s): CK ------------------------------------------------------------------------------------------------------------------ No results found for: BNP   ---------------------------------------------------------------------------------------------------------------  Urinalysis    Component Value Date/Time   COLORURINE YELLOW 08/01/2015 1244   APPEARANCEUR CLOUDY (A) 08/01/2015 1244   LABSPEC 1.007 08/01/2015 1244   PHURINE 5.0 08/01/2015 [facility] 08/01/2015 1244   HGBUR MODERATE (A) 08/01/2015 [facility] 08/01/2015 [facility] 08/01/2015 1244   PROTEINUR NEGATIVE 08/01/2015 1244   NITRITE NEGATIVE 08/01/2015 1244   LEUKOCYTESUR LARGE (A) 08/01/2015 1244    ----------------------------------------------------------------------------------------------------------------   Imaging Results:    CT Head Wo Contrast  Result Date: 11/02/2020 CLINICAL DATA:  Cerebral hemorrhage suspected EXAM: CT HEAD WITHOUT CONTRAST TECHNIQUE: Contiguous axial images were obtained from the base of the skull through the vertex without intravenous contrast. COMPARISON:  None. FINDINGS: Brain: There is subdural hemorrhage along the right tentorial leaflet measuring 2 mm in thickness. No midline shift. There is no evidence of large territory infarct. Sequela of chronic small vessel ischemic disease. Vascular: Vascular calcifications. Skull: No acute fracture. Sinuses/Orbits: No acute findings. Other: Small left frontal scalp hematoma measuring 1.6 cm. A there is a subcutaneous cystic lesion along the right parietal scalp with calcification measuring 1.0 cm. IMPRESSION: Subdural hemorrhage along the right tentorial leaflet measuring 2 mm in  thickness. No significant mass effect. Small left frontal scalp hematoma. Critical Value/emergent results were called by telephone at the time of interpretation on 10/15/2020 at 7:13 pm to provider JOSEPH ZAMMIT , who verbally acknowledged these results. Electronically Signed   By: Maurine Simmering   On: 11/12/2020 19:15   DG Chest Port 1 View  Result Date: 10/15/2020 CLINICAL DATA:  Shortness of breath. EXAM: PORTABLE CHEST 1 VIEW COMPARISON:  September 06, 2020. FINDINGS: Stable cardiomegaly. Left subclavian Port-A-Cath is unchanged in position. No pneumothorax or significant pleural effusion is noted. Right lung is clear. Mild left basilar subsegmental atelectasis is noted. Bony thorax is unremarkable. IMPRESSION: Mild left basilar subsegmental atelectasis. Electronically Signed   By: Marijo Conception M.D.   On: 10/13/2020 15:48    My personal review of EKG: Rhythm NSR, Rate 103 /min, QTc 485   Assessment & Plan:    Active Problems:  DLBCL (diffuse large B cell lymphoma) (HCC)   Dehydration   Anemia, unspecified   Subdural hematoma (HCC)  Failure to thrive/generalized weakness/falls/protein calorie malnutrition -This is most likely in the setting of her lymphoma, with recent chemotherapy, she is extremely frail, deconditioned and dehydrated, will keep on IV fluid, nutritional supplement, will consult nutritionist/PT/OT, and will keep on IV fluids.  Hypokalemia -Repleted check in a.m.  Subdural hematoma -Due to her recent fall, ED physician discussed with neurosurgery PA Glenford Peers, recommendation for repeat CT head in a.m.  Diffuse large B-cell lymphoma -On chemotherapy, followed by Dr. Delton Coombes  Hyponatremia -Volume depletion, recheck in a.m.  Lactic acidosis -In the setting of dehydration, after appropriate IV hydration  Elevated  troponins  In the setting of demand ischemia, delta is less than 20, she denies any chest pain, no anginal symptoms  Leukocytosis -Most likely related to  her Neulasta she received as as an outpatient as ED physician discussed with oncology  Anemia -In the setting of her lymphoma and chemotherapy, will transfuse 1 unit  Left lower extremity wound -We will consult wound care   DVT Prophylaxis  SCDs  AM Labs Ordered, also please review Full Orders  Family Communication: Admission, patients condition and plan of care including tests being ordered have been discussed with the patient and and husband at bedside who indicate understanding and agree with the plan and Code Status.  Code Status DNR  Likely DC to home  Condition GUARDED    Consults called: None, will request palliative  Admission status: Observation  Time spent in minutes : 60 minutes   Phillips Climes M.D on 10/20/2020 at 10:09 PM   [facility] - Office  [phone]

## 2020-11-08 NOTE — ED Provider Notes (Signed)
I spoke with neurosurgery and they evaluated the CT scan of the head of Erica Mora which showed a small subdural.  They recommended repeat head scan tomorrow   Milton Ferguson, MD 10/25/2020 2001

## 2020-11-08 NOTE — ED Provider Notes (Signed)
[facility] EMERGENCY DEPARTMENT Provider Note   CSN: [id number] Arrival date & time: 11/05/2020  1350     History Chief Complaint  Patient presents with  . Shortness of Breath    Erica Mora is a 80 y.o. female.  Patient's husband states that around noon he tried to wake her up and all she would do is open her eyes.  She would not respond normally.  He called the paramedics and her O2 was very low so they put her on some oxygen.  By time she got to the emergency department she was starting to wake up and was no longer hypoxic.  Patient has a history of stage III B-cell lymphoma.  The history is provided by the patient and a relative.  Weakness Severity:  Moderate Onset quality:  Sudden Timing:  Intermittent Progression:  Waxing and waning Chronicity:  New Context: not alcohol use   Relieved by:  Nothing Worsened by:  Nothing Ineffective treatments:  None tried Associated symptoms: no abdominal pain        Past Medical History:  Diagnosis Date  . Port-A-Cath in place 09/03/2020  . Prolapse of female bladder, acquired 1973    Patient Active Problem List   Diagnosis Date Noted  . Dehydration 10/25/2020  . Port-A-Cath in place 09/03/2020  . 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 Inguinal adenopathy 08/23/2020  . Retroperitoneal lymphadenopathy 08/15/2020  . Humerus distal fracture 08/02/2015    Past Surgical History:  Procedure Laterality Date  . EYE SURGERY     cataracts- bilateral    . INGUINAL LYMPH NODE BIOPSY Left 08/23/2020   Procedure: INGUINAL LYMPH NODE BIOPSY, LEFT;  Surgeon: Virl Cagey, MD;  Location: [facility];  Service: General;  Laterality: Left;  . ORIF HUMERUS FRACTURE Left 08/02/2015   Procedure: OPEN REDUCTION INTERNAL FIXATION (ORIF)LEFT DISTAL RADIUS,HUMERUS FRACTURE;  Surgeon: [place], MD;  Location: [facility];  Service: Orthopedics;  Laterality: Left;  . PORTACATH PLACEMENT Left 09/06/2020   Procedure: INSERTION  PORT-A-CATH;  Surgeon: Virl Cagey, MD;  Location: [facility];  Service: General;  Laterality: Left;  . TUBAL LIGATION    . VAGINAL DELIVERY       OB History   No obstetric history on file.     No family history on file.  Social History   Tobacco Use  . Smoking status: Never Smoker  . Smokeless tobacco: Never Used  Vaping Use  . Vaping Use: Never used  Substance Use Topics  . Alcohol use: Yes    Comment: wine glasses per day - 2 per day  . Drug use: No    Home Medications Prior to Admission medications   Medication Sig Start Date End Date Taking? Authorizing Provider  allopurinol (ZYLOPRIM) 300 MG tablet Take 1 tablet (300 mg total) by mouth daily. 08/29/20   Derek Jack, MD  ALPRAZolam Duanne Moron) 0.25 MG tablet Take 1 tablet (0.25 mg total) by mouth at bedtime as needed for anxiety. 10/01/20   Derek Jack, MD  CYCLOPHOSPHAMIDE IV Inject into the vein every 21 ( twenty-one) days. 09/09/20   [provider]  DOXORUBICIN HCL IV Inject into the vein every 21 ( twenty-one) days. 09/09/20   [provider]  Doxylamine Succinate, Sleep, (SLEEP AID PO) Take 1 tablet by mouth at bedtime.    [provider]  hydrOXYzine (ATARAX/VISTARIL) 10 MG tablet Take 10 mg by mouth 3 (three) times daily as needed for anxiety or itching. 08/09/20  [provider]  lidocaine-prilocaine (EMLA) cream Apply a small amount to port a cath site and cover with plastic wrap 1 hour prior to infusion appointments. 09/03/20   Derek Jack, MD  naproxen sodium (ALEVE) 220 MG tablet Take 110 mg by mouth 2 (two) times daily as needed (pain).    [provider]  ondansetron (ZOFRAN) 4 MG tablet Take 1 tablet (4 mg total) by mouth every 8 (eight) hours as needed. 08/23/20 08/23/21  Virl Cagey, MD  oxyCODONE (ROXICODONE) 5 MG immediate release tablet Take 1 tablet (5 mg total) by mouth every 4 (four) hours as needed for severe pain or breakthrough  pain. 09/06/20 09/06/21  Virl Cagey, MD  potassium chloride (KLOR-CON) 10 MEQ tablet Take 1 tablet (10 mEq total) by mouth daily. Take 2 tablets by mouth once daily. 09/16/20   Derek Jack, MD  potassium chloride (KLOR-CON) 10 MEQ tablet Take 20 mEq by mouth daily. 09/17/20   [provider]  predniSONE (DELTASONE) 20 MG tablet Take 5 tablets (100 mg) by mouth with breakfast daily on Days 1-5 of chemotherapy treatments 09/03/20   Derek Jack, MD  prochlorperazine (COMPAZINE) 10 MG tablet Take 1 tablet (10 mg total) by mouth every 6 (six) hours as needed (Nausea or vomiting). 09/03/20   Derek Jack, MD  RITUXIMAB IV Inject into the vein every 21 ( twenty-one) days. 09/09/20   [provider]  VINCRISTINE SULFATE IV Inject into the vein every 21 ( twenty-one) days. 09/09/20   [provider]    Allergies    Patient has no known allergies.  Review of Systems   Review of Systems  Unable to perform ROS: Mental status change  Gastrointestinal: Negative for abdominal pain.  Neurological: Positive for weakness.    Physical Exam Updated Vital Signs BP (!) 105/57   Pulse 99   Temp 98.5 F (36.9 C) (Oral)   Resp (!) 28   Wt 46.2 kg   SpO2 96%   BMI 19.89 kg/m   Physical Exam Vitals and nursing note reviewed.  Constitutional:      Appearance: She is well-developed.     Comments: Lethargic  HENT:     Head: Normocephalic.     Comments: Bruising to forehead and nose    Nose: No congestion.  Eyes:     General: No scleral icterus.    Conjunctiva/sclera: Conjunctivae normal.  Neck:     Thyroid: No thyromegaly.  Cardiovascular:     Rate and Rhythm: Normal rate and regular rhythm.     Heart sounds: No murmur heard. No friction rub. No gallop.   Pulmonary:     Breath sounds: No stridor. No wheezing or rales.  Chest:     Chest wall: No tenderness.  Abdominal:     General: There is no distension.     Tenderness: There is no abdominal  tenderness. There is no rebound.  Musculoskeletal:        General: Normal range of motion.     Cervical back: Neck supple.  Lymphadenopathy:     Cervical: No cervical adenopathy.  Skin:    Findings: No erythema or rash.  Neurological:     Motor: No abnormal muscle tone.     Coordination: Coordination normal.     Comments: Oriented to person place only  Psychiatric:        Behavior: Behavior normal.     ED Results / Procedures / Treatments   Labs (all labs ordered are listed, but  only abnormal results are displayed) Labs Reviewed  CBC WITH DIFFERENTIAL/PLATELET - Abnormal; Notable for the following components:      Result Value   WBC 26.1 (*)    RBC 2.68 (*)    Hemoglobin 7.5 (*)    HCT 23.2 (*)    RDW 17.8 (*)    Neutro Abs 23.2 (*)    All other components within normal limits  COMPREHENSIVE METABOLIC PANEL - Abnormal; Notable for the following components:   Sodium 130 (*)    Potassium 2.9 (*)    Chloride 94 (*)    Glucose, Bld 119 (*)    BUN 28 (*)    Calcium 7.2 (*)    Total Protein 5.0 (*)    Albumin 1.9 (*)    GFR, Estimated 58 (*)    All other components within normal limits  LACTIC ACID, PLASMA - Abnormal; Notable for the following components:   Lactic Acid, Venous 2.2 (*)    All other components within normal limits  TROPONIN I (HIGH SENSITIVITY) - Abnormal; Notable for the following components:   Troponin I (High Sensitivity) 131 (*)    All other components within normal limits  TROPONIN I (HIGH SENSITIVITY) - Abnormal; Notable for the following components:   Troponin I (High Sensitivity) 138 (*)    All other components within normal limits  RESP PANEL BY RT-PCR (FLU A&B, COVID) ARPGX2  URINALYSIS, ROUTINE W REFLEX MICROSCOPIC    EKG EKG Interpretation  Date/Time:  Friday Nov 08 2020 16:08:02 EDT Ventricular Rate:  103 PR Interval:  152 QRS Duration: 95 QT Interval:  370 QTC Calculation: 485 R Axis:   226 Text Interpretation: Right and left arm  electrode reversal, interpretation assumes no reversal Sinus tachycardia Right axis deviation Low voltage, precordial leads Abnormal R-wave progression, early transition Nonspecific T abnormalities, lateral leads Baseline wander in lead(s) V2 Confirmed by Milton Ferguson [phone]) on 11/01/2020 4:48:12 PM   Radiology CT Head Wo Contrast  Result Date: 10/18/2020 CLINICAL DATA:  Cerebral hemorrhage suspected EXAM: CT HEAD WITHOUT CONTRAST TECHNIQUE: Contiguous axial images were obtained from the base of the skull through the vertex without intravenous contrast. COMPARISON:  None. FINDINGS: Brain: There is subdural hemorrhage along the right tentorial leaflet measuring 2 mm in thickness. No midline shift. There is no evidence of large territory infarct. Sequela of chronic small vessel ischemic disease. Vascular: Vascular calcifications. Skull: No acute fracture. Sinuses/Orbits: No acute findings. Other: Small left frontal scalp hematoma measuring 1.6 cm. A there is a subcutaneous cystic lesion along the right parietal scalp with calcification measuring 1.0 cm. IMPRESSION: Subdural hemorrhage along the right tentorial leaflet measuring 2 mm in thickness. No significant mass effect. Small left frontal scalp hematoma. Critical Value/emergent results were called by telephone at the time of interpretation on 10/16/2020 at 7:13 pm to provider Emilio Baylock , who verbally acknowledged these results. Electronically Signed   By: Maurine Simmering   On: 10/13/2020 19:15   DG Chest Port 1 View  Result Date: 11/04/2020 CLINICAL DATA:  Shortness of breath. EXAM: PORTABLE CHEST 1 VIEW COMPARISON:  September 06, 2020. FINDINGS: Stable cardiomegaly. Left subclavian Port-A-Cath is unchanged in position. No pneumothorax or significant pleural effusion is noted. Right lung is clear. Mild left basilar subsegmental atelectasis is noted. Bony thorax is unremarkable. IMPRESSION: Mild left basilar subsegmental atelectasis. Electronically Signed    By: Marijo Conception M.D.   On: 11/06/2020 15:48    Procedures Procedures   Medications Ordered in ED Medications  potassium chloride 10 mEq in 100 mL IVPB (has no administration in time range)  sodium chloride 0.9 % bolus 1,000 mL (has no administration in time range)  sodium chloride 0.9 % bolus 1,000 mL (1,000 mLs Intravenous New Bag/Given 10/15/2020 1609)  potassium chloride 10 mEq in 100 mL IVPB (10 mEq Intravenous New Bag/Given 10/25/2020 1819)   CRITICAL CARE Performed by: Milton Ferguson Total critical care time: 45 minutes Critical care time was exclusive of separately billable procedures and treating other patients. Critical care was necessary to treat or prevent imminent or life-threatening deterioration. Critical care was time spent personally by me on the following activities: development of treatment plan with patient and/or surrogate as well as nursing, discussions with consultants, evaluation of patient's response to treatment, examination of patient, obtaining history from patient or surrogate, ordering and performing treatments and interventions, ordering and review of laboratory studies, ordering and review of radiographic studies, pulse oximetry and re-evaluation of patient's condition.  ED Course  I have reviewed the triage vital signs and the nursing notes.  Pertinent labs & imaging results that were available during my care of the patient were reviewed by me and considered in my medical decision making (see chart for details). Patient has a elevated troponin.  I spoke to cardiology and they feel like the patient can be admitted at [facility] and have her troponins followed.  I also spoke with her oncologist Dr. Delton Coombes and he agreed with admission.  Patient has a small subdural hematoma from a fall on Tuesday.  We will speak with neurosurgery and see what they recommend.   MDM Rules/Calculators/A&P                         Lethargy with stage III B-cell lymphoma and  recent fall Final Clinical Impression(s) / ED Diagnoses Final diagnoses:  Weakness    Rx / DC Orders ED Discharge Orders    None       Milton Ferguson, MD 11/06/2020 [place] [place]

## 2020-11-08 NOTE — ED Notes (Signed)
Pt in bed, pt oriented to person and place, doesn't know day of the week. Re oriented pt.

## 2020-11-08 NOTE — ED Notes (Signed)
Pt in bed, replaced O2 sat probe, pt awake and talking, replaced 4L O2 via [place], pt satting 100% on 4L, pt desatted down into the 70s on room air.

## 2020-11-08 NOTE — Progress Notes (Signed)
   Called by Dr. Roderic Palau about this patient who presented with altered mental status and decline. Labwork demonstrates anemia with hemoglobin of 7.5, significant leukocytosis at 26K, elevated lactate at 2.2, hyponatremia -sodium 130 and multiple other abnormalities. Cardiology is called regarding a mildly elevated troponin of 131. I feel that this is more likely related to her metabolic derangement. Not a cath candidate at present. Could trend troponins. Can contact us if troponin significantly elevated with anginal symptoms. Echo in 08/2020 showed normal LVEF.  Pixie Casino, MD, [facility], [place] Director of the [facility]  Card[facility] Diplomate of the American Board of Clinical Lipidology Attending Cardiologist  Direct Dial: [phone]  Fax: [phone]  Website:  [email or web address]

## 2020-11-08 NOTE — Telephone Encounter (Signed)
Reached out to patient to assess status.  Per husband, Eddie Dibbles she is eating and drinking very little.  States she fell 2 days ago without any obvious injury.  Patient does not have appointment until 6/1.  Advised to have her taken to ED for evaluation of decline in status.  He states that she does not want to go to the emergency room, however will talk to her about the importance of being able to receive her treatment on Wednesday.

## 2020-11-08 NOTE — ED Notes (Signed)
Husband asking for warm blanket for wife. Checked temperature on patient to make sure no fever. Patient's temperature 98.3 orally

## 2020-11-09 ENCOUNTER — Observation Stay (HOSPITAL_COMMUNITY): Payer: Medicare Other

## 2020-11-09 ENCOUNTER — Inpatient Hospital Stay (HOSPITAL_COMMUNITY): Payer: Medicare Other

## 2020-11-09 DIAGNOSIS — J9601 Acute respiratory failure with hypoxia: Secondary | ICD-10-CM | POA: Diagnosis present

## 2020-11-09 DIAGNOSIS — Z515 Encounter for palliative care: Secondary | ICD-10-CM | POA: Diagnosis not present

## 2020-11-09 DIAGNOSIS — R590 Localized enlarged lymph nodes: Secondary | ICD-10-CM | POA: Diagnosis present

## 2020-11-09 DIAGNOSIS — E872 Acidosis: Secondary | ICD-10-CM | POA: Diagnosis present

## 2020-11-09 DIAGNOSIS — G928 Other toxic encephalopathy: Secondary | ICD-10-CM | POA: Diagnosis present

## 2020-11-09 DIAGNOSIS — R531 Weakness: Secondary | ICD-10-CM

## 2020-11-09 DIAGNOSIS — E871 Hypo-osmolality and hyponatremia: Secondary | ICD-10-CM | POA: Diagnosis present

## 2020-11-09 DIAGNOSIS — R64 Cachexia: Secondary | ICD-10-CM | POA: Diagnosis present

## 2020-11-09 DIAGNOSIS — Z20822 Contact with and (suspected) exposure to covid-19: Secondary | ICD-10-CM | POA: Diagnosis present

## 2020-11-09 DIAGNOSIS — E860 Dehydration: Secondary | ICD-10-CM | POA: Diagnosis present

## 2020-11-09 DIAGNOSIS — L89156 Pressure-induced deep tissue damage of sacral region: Secondary | ICD-10-CM | POA: Diagnosis present

## 2020-11-09 DIAGNOSIS — Z66 Do not resuscitate: Secondary | ICD-10-CM | POA: Diagnosis present

## 2020-11-09 DIAGNOSIS — Z79899 Other long term (current) drug therapy: Secondary | ICD-10-CM | POA: Diagnosis not present

## 2020-11-09 DIAGNOSIS — D630 Anemia in neoplastic disease: Secondary | ICD-10-CM | POA: Diagnosis present

## 2020-11-09 DIAGNOSIS — L89616 Pressure-induced deep tissue damage of right heel: Secondary | ICD-10-CM | POA: Diagnosis present

## 2020-11-09 DIAGNOSIS — I248 Other forms of acute ischemic heart disease: Secondary | ICD-10-CM | POA: Diagnosis present

## 2020-11-09 DIAGNOSIS — R0902 Hypoxemia: Secondary | ICD-10-CM

## 2020-11-09 DIAGNOSIS — E876 Hypokalemia: Secondary | ICD-10-CM | POA: Diagnosis present

## 2020-11-09 DIAGNOSIS — Z681 Body mass index (BMI) 19 or less, adult: Secondary | ICD-10-CM | POA: Diagnosis not present

## 2020-11-09 DIAGNOSIS — C8330 Diffuse large B-cell lymphoma, unspecified site: Secondary | ICD-10-CM | POA: Diagnosis present

## 2020-11-09 DIAGNOSIS — D649 Anemia, unspecified: Secondary | ICD-10-CM | POA: Diagnosis not present

## 2020-11-09 DIAGNOSIS — S065X9A Traumatic subdural hemorrhage with loss of consciousness of unspecified duration, initial encounter: Secondary | ICD-10-CM | POA: Diagnosis present

## 2020-11-09 DIAGNOSIS — S0083XA Contusion of other part of head, initial encounter: Secondary | ICD-10-CM | POA: Diagnosis present

## 2020-11-09 DIAGNOSIS — W1830XA Fall on same level, unspecified, initial encounter: Secondary | ICD-10-CM | POA: Diagnosis present

## 2020-11-09 DIAGNOSIS — R627 Adult failure to thrive: Secondary | ICD-10-CM | POA: Diagnosis present

## 2020-11-09 DIAGNOSIS — Z9221 Personal history of antineoplastic chemotherapy: Secondary | ICD-10-CM | POA: Diagnosis not present

## 2020-11-09 DIAGNOSIS — R54 Age-related physical debility: Secondary | ICD-10-CM | POA: Diagnosis present

## 2020-11-09 DIAGNOSIS — L89626 Pressure-induced deep tissue damage of left heel: Secondary | ICD-10-CM | POA: Diagnosis present

## 2020-11-09 LAB — CBC
HCT: 24.4 % — ABNORMAL LOW (ref 36.0–46.0)
Hemoglobin: 8.2 g/dL — ABNORMAL LOW (ref 12.0–15.0)
MCH: 28.5 pg (ref 26.0–34.0)
MCHC: 33.6 g/dL (ref 30.0–36.0)
MCV: 84.7 fL (ref 80.0–100.0)
Platelets: 126 10*3/uL — ABNORMAL LOW (ref 150–400)
RBC: 2.88 MIL/uL — ABNORMAL LOW (ref 3.87–5.11)
RDW: 17 % — ABNORMAL HIGH (ref 11.5–15.5)
WBC: 30.2 10*3/uL — ABNORMAL HIGH (ref 4.0–10.5)
nRBC: 0.1 % (ref 0.0–0.2)

## 2020-11-09 LAB — CBC WITH DIFFERENTIAL/PLATELET
Abs Immature Granulocytes: 1.57 10*3/uL — ABNORMAL HIGH (ref 0.00–0.07)
Basophils Absolute: 0 10*3/uL (ref 0.0–0.1)
Basophils Relative: 0 %
Eosinophils Absolute: 0 10*3/uL (ref 0.0–0.5)
Eosinophils Relative: 0 %
HCT: 25.1 % — ABNORMAL LOW (ref 36.0–46.0)
Hemoglobin: 8.3 g/dL — ABNORMAL LOW (ref 12.0–15.0)
Immature Granulocytes: 6 %
Lymphocytes Relative: 15 %
Lymphs Abs: 4.1 10*3/uL — ABNORMAL HIGH (ref 0.7–4.0)
MCH: 28.5 pg (ref 26.0–34.0)
MCHC: 33.1 g/dL (ref 30.0–36.0)
MCV: 86.3 fL (ref 80.0–100.0)
Monocytes Absolute: 2.1 10*3/uL — ABNORMAL HIGH (ref 0.1–1.0)
Monocytes Relative: 8 %
Neutro Abs: 19.3 10*3/uL — ABNORMAL HIGH (ref 1.7–7.7)
Neutrophils Relative %: 71 %
Platelets: 106 10*3/uL — ABNORMAL LOW (ref 150–400)
RBC: 2.91 MIL/uL — ABNORMAL LOW (ref 3.87–5.11)
RDW: 17.4 % — ABNORMAL HIGH (ref 11.5–15.5)
WBC: 27.1 10*3/uL — ABNORMAL HIGH (ref 4.0–10.5)
nRBC: 0.1 % (ref 0.0–0.2)

## 2020-11-09 LAB — COMPREHENSIVE METABOLIC PANEL
ALT: 12 U/L (ref 0–44)
AST: 23 U/L (ref 15–41)
Albumin: 1.6 g/dL — ABNORMAL LOW (ref 3.5–5.0)
Alkaline Phosphatase: 108 U/L (ref 38–126)
Anion gap: 8 (ref 5–15)
BUN: 31 mg/dL — ABNORMAL HIGH (ref 8–23)
CO2: 26 mmol/L (ref 22–32)
Calcium: 7 mg/dL — ABNORMAL LOW (ref 8.9–10.3)
Chloride: 101 mmol/L (ref 98–111)
Creatinine, Ser: 1.01 mg/dL — ABNORMAL HIGH (ref 0.44–1.00)
GFR, Estimated: 56 mL/min — ABNORMAL LOW (ref >60)
Glucose, Bld: 101 mg/dL — ABNORMAL HIGH (ref 70–99)
Potassium: 2.9 mmol/L — ABNORMAL LOW (ref 3.5–5.1)
Sodium: 135 mmol/L (ref 135–145)
Total Bilirubin: 0.8 mg/dL (ref 0.3–1.2)
Total Protein: 4.4 g/dL — ABNORMAL LOW (ref 6.5–8.1)

## 2020-11-09 LAB — URINALYSIS, ROUTINE W REFLEX MICROSCOPIC
Bilirubin Urine: NEGATIVE
Glucose, UA: NEGATIVE mg/dL
Ketones, ur: NEGATIVE mg/dL
Nitrite: NEGATIVE
Protein, ur: NEGATIVE mg/dL
Specific Gravity, Urine: 1.009 (ref 1.005–1.030)
pH: 7 (ref 5.0–8.0)

## 2020-11-09 LAB — BPAM RBC
Blood Product Expiration Date: 202206172359
ISSUE DATE / TIME: 202205272132
Unit Type and Rh: 5100

## 2020-11-09 LAB — BASIC METABOLIC PANEL
Anion gap: 9 (ref 5–15)
BUN: 28 mg/dL — ABNORMAL HIGH (ref 8–23)
CO2: 26 mmol/L (ref 22–32)
Calcium: 7.2 mg/dL — ABNORMAL LOW (ref 8.9–10.3)
Chloride: 102 mmol/L (ref 98–111)
Creatinine, Ser: 1.04 mg/dL — ABNORMAL HIGH (ref 0.44–1.00)
GFR, Estimated: 54 mL/min — ABNORMAL LOW (ref >60)
Glucose, Bld: 107 mg/dL — ABNORMAL HIGH (ref 70–99)
Potassium: 2.8 mmol/L — ABNORMAL LOW (ref 3.5–5.1)
Sodium: 137 mmol/L (ref 135–145)

## 2020-11-09 LAB — MAGNESIUM: Magnesium: 1.7 mg/dL (ref 1.7–2.4)

## 2020-11-09 LAB — TYPE AND SCREEN
ABO/RH(D): O POS
Antibody Screen: NEGATIVE
Unit division: 0

## 2020-11-09 MED ORDER — TRAZODONE HCL 50 MG PO TABS: 25.0000 mg | ORAL_TABLET | Freq: Every evening | ORAL | Status: DC | PRN

## 2020-11-09 MED ORDER — GLYCOPYRROLATE 1 MG PO TABS
1.0000 mg | ORAL_TABLET | ORAL | Status: DC | PRN
Start: 2020-11-09 — End: 2020-11-11

## 2020-11-09 MED ORDER — ALLOPURINOL 300 MG PO TABS: 300.0000 mg | ORAL_TABLET | Freq: Every day | ORAL | Status: DC

## 2020-11-09 MED ORDER — MORPHINE SULFATE (PF) 2 MG/ML IV SOLN
1.0000 mg | INTRAVENOUS | Status: DC | PRN
  Administered 2020-11-09 – 2020-11-10 (×2): 2 mg via INTRAVENOUS
  Administered 2020-11-10 (×2): 4 mg via INTRAVENOUS
  Filled 2020-11-09: qty 1
  Filled 2020-11-09: qty 2
  Filled 2020-11-09: qty 1
  Filled 2020-11-09: qty 2

## 2020-11-09 MED ORDER — ONDANSETRON 4 MG PO TBDP: 4.0000 mg | ORAL_TABLET | Freq: Four times a day (QID) | ORAL | Status: DC | PRN

## 2020-11-09 MED ORDER — ACETAMINOPHEN 650 MG RE SUPP: 650.0000 mg | Freq: Four times a day (QID) | RECTAL | Status: DC | PRN

## 2020-11-09 MED ORDER — POTASSIUM CHLORIDE CRYS ER 20 MEQ PO TBCR: 20.0000 meq | EXTENDED_RELEASE_TABLET | Freq: Every day | ORAL | Status: DC

## 2020-11-09 MED ORDER — SODIUM CHLORIDE 0.9 % IV SOLN
500.0000 mg | INTRAVENOUS | Status: DC
  Administered 2020-11-09: 500 mg via INTRAVENOUS
  Filled 2020-11-09: qty 500

## 2020-11-09 MED ORDER — PANTOPRAZOLE SODIUM 40 MG IV SOLR
40.0000 mg | INTRAVENOUS | Status: DC
  Administered 2020-11-09: 40 mg via INTRAVENOUS
  Filled 2020-11-09 (×2): qty 40

## 2020-11-09 MED ORDER — ACETAMINOPHEN 325 MG PO TABS: 650.0000 mg | ORAL_TABLET | Freq: Four times a day (QID) | ORAL | Status: DC | PRN

## 2020-11-09 MED ORDER — LORAZEPAM 1 MG PO TABS: 1.0000 mg | ORAL_TABLET | ORAL | Status: DC | PRN

## 2020-11-09 MED ORDER — ORAL CARE MOUTH RINSE: 15.0000 mL | Freq: Two times a day (BID) | OROMUCOSAL | Status: DC

## 2020-11-09 MED ORDER — GLYCOPYRROLATE 0.2 MG/ML IJ SOLN: 0.2000 mg | INTRAMUSCULAR | Status: DC | PRN

## 2020-11-09 MED ORDER — HALOPERIDOL LACTATE 5 MG/ML IJ SOLN: 0.5000 mg | INTRAMUSCULAR | Status: DC | PRN

## 2020-11-09 MED ORDER — ALPRAZOLAM 0.25 MG PO TABS: 0.2500 mg | ORAL_TABLET | Freq: Every evening | ORAL | Status: DC | PRN

## 2020-11-09 MED ORDER — BACLOFEN 10 MG PO TABS: 5.0000 mg | ORAL_TABLET | Freq: Two times a day (BID) | ORAL | Status: DC | PRN

## 2020-11-09 MED ORDER — FUROSEMIDE 10 MG/ML IJ SOLN
20.0000 mg | Freq: Once | INTRAMUSCULAR | Status: AC
  Administered 2020-11-09: 20 mg via INTRAVENOUS
  Filled 2020-11-09: qty 2

## 2020-11-09 MED ORDER — LORAZEPAM 2 MG/ML IJ SOLN
1.0000 mg | INTRAMUSCULAR | Status: DC | PRN
  Administered 2020-11-10: 1 mg via INTRAVENOUS
  Filled 2020-11-09: qty 1

## 2020-11-09 MED ORDER — OXYCODONE HCL 5 MG PO TABS: 5.0000 mg | ORAL_TABLET | ORAL | Status: DC | PRN

## 2020-11-09 MED ORDER — NYSTATIN 100000 UNIT/GM EX POWD: Freq: Three times a day (TID) | CUTANEOUS | Status: DC | PRN

## 2020-11-09 MED ORDER — POLYVINYL ALCOHOL 1.4 % OP SOLN: 1.0000 [drp] | Freq: Four times a day (QID) | OPHTHALMIC | Status: DC | PRN

## 2020-11-09 MED ORDER — CHLORHEXIDINE GLUCONATE CLOTH 2 % EX PADS
6.0000 | MEDICATED_PAD | Freq: Every day | CUTANEOUS | Status: DC
  Administered 2020-11-09: 6 via TOPICAL

## 2020-11-09 MED ORDER — ONDANSETRON HCL 4 MG/2ML IJ SOLN: 4.0000 mg | Freq: Four times a day (QID) | INTRAMUSCULAR | Status: DC | PRN

## 2020-11-09 MED ORDER — LORAZEPAM 2 MG/ML PO CONC: 1.0000 mg | ORAL | Status: DC | PRN

## 2020-11-09 MED ORDER — PANTOPRAZOLE SODIUM 40 MG PO TBEC: 40.0000 mg | DELAYED_RELEASE_TABLET | Freq: Every day | ORAL | Status: DC

## 2020-11-09 MED ORDER — HALOPERIDOL LACTATE 2 MG/ML PO CONC
0.5000 mg | ORAL | Status: DC | PRN
  Filled 2020-11-09: qty 0.3

## 2020-11-09 MED ORDER — BIOTENE DRY MOUTH MT LIQD: 15.0000 mL | OROMUCOSAL | Status: DC | PRN

## 2020-11-09 MED ORDER — SODIUM CHLORIDE 0.9 % IV SOLN
1.0000 g | INTRAVENOUS | Status: DC
  Administered 2020-11-09: 1 g via INTRAVENOUS
  Filled 2020-11-09: qty 10

## 2020-11-09 MED ORDER — DIPHENHYDRAMINE HCL 50 MG/ML IJ SOLN: 12.5000 mg | INTRAMUSCULAR | Status: DC | PRN

## 2020-11-09 MED ORDER — HALOPERIDOL 0.5 MG PO TABS: 0.5000 mg | ORAL_TABLET | ORAL | Status: DC | PRN

## 2020-11-09 MED ORDER — LORAZEPAM 2 MG/ML IJ SOLN: 1.0000 mg | INTRAMUSCULAR | Status: DC | PRN

## 2020-11-09 MED ORDER — GLYCOPYRROLATE 0.2 MG/ML IJ SOLN
0.2000 mg | INTRAMUSCULAR | Status: DC | PRN
  Administered 2020-11-09 – 2020-11-10 (×3): 0.2 mg via INTRAVENOUS
  Filled 2020-11-09 (×3): qty 1

## 2020-11-09 MED ORDER — OXYBUTYNIN CHLORIDE 5 MG PO TABS: 2.5000 mg | ORAL_TABLET | Freq: Four times a day (QID) | ORAL | Status: DC | PRN

## 2020-11-09 NOTE — ED Notes (Signed)
Family reminded to stay in patients room.

## 2020-11-09 NOTE — ED Notes (Signed)
Pt continues to moan "Oh God" when asked if she is in pain she states no.

## 2020-11-09 NOTE — ED Notes (Signed)
Pt cleansed and changed.  Pt had small BM.  Pts face cleansed and upper body washed.    Noted:  Pts port in left upper clavicle area has pus coming from the area.  Port is seen through the skin.

## 2020-11-09 NOTE — ED Notes (Signed)
Patient desats to 50's if NRB taken off therefore PO meds/fluids held at this time.

## 2020-11-09 NOTE — ED Notes (Signed)
Assumed care of pt.  Family requesting to speak to a provider, family educated on pts situation, family members at bedside.

## 2020-11-09 NOTE — ED Notes (Signed)
Pt O2 dropped to 75% on 6L [place]. Pt placed on 15L non rebreather. Dr. Earnest Conroy notified.

## 2020-11-09 NOTE — Progress Notes (Signed)
PROGRESS NOTE    Patient: Erica Mora                            PCP: Asencion Noble, MD                    DOB: 13-Jul-1940            DOA: 10/19/2020 WNU:[id number]             DOS: 11/09/2020, 2:01 PM   LOS: 0 days   Date of Service: The patient was seen and examined on 11/09/2020  Patient is signed to attending Dr. Clydene Laming who is not presently in the campus.  Requested by ED, nursing staff nursing supervisor for patient to be seen as she is declining.   Subjective:   Was called to the bedside by nursing staff and family. Patient was seen and examined, lethargic, minimally interactive on 15 L of oxygen via nonrebreather will mask satting 91%.. Tachypneic,  Overnight patient declined in mental status, increased respiratory effort... Hypoxic   Brief Narrative:   80 year old female with diagnosis of large B-cell lymphoma, status post port a catheter placement on chemotherapy, status post recent fall and trauma to rib and head, inguinal adenopathy, retroperitoneal lymphadenopathy, lymphedema.... Presented overnight with shortness of breath lethargic, hypoxic,  Assessment & Plan:   Active Problems:   DLBCL (diffuse large B cell lymphoma) (HCC)   Dehydration   Anemia, unspecified   Subdural hematoma (HCC)   Failure to thrive in adult   Progressive mental and physical decline Acute respiratory failure, with toxic metabolic encephalopathy -Apparently overnight patient started to decline, and mental status and increased respiratory effort -Patient received 1 unit of blood overnight.  Followed by [facility]   This morning patient was in respiratory failure requiring 15 L of oxygen via nonrebreather mask-satting 91%  Remained lethargic, minimally interactive   The patient was seen and examined all labs and findings were reviewed.  The findings were discussed with the family, sister and husband at the bedside  Even current finding comorbidities and a steep acute decline is apparent  that the prognosis is poor.  They expressed understanding.  It was clearly expressed to the family the patient's prognosis very poor.  Comfort care hospice was offered family declined at this time.   Code Status:   Code Status: DNR  Family Communication: Discussed with family members at bedside, sister and husband The above findings and plan of care has been discussed with patient (and family)  in detail,  they expressed understanding and agreement of above. -Advance care planning has been discussed.   Admission status:   Status is: Inpatient  Level of care: Stepdown   Procedures:   No admission procedures for hospital encounter.     Antimicrobials:  Anti-infectives (From admission, onward)   Start     Dose/Rate Route Frequency Ordered Stop   11/09/20 0245  cefTRIAXone (ROCEPHIN) 1 g in sodium chloride 0.9 % 100 mL IVPB        1 g 200 mL/hr over 30 Minutes Intravenous Every 24 hours 11/09/20 0241     11/09/20 0245  azithromycin (ZITHROMAX) 500 mg in sodium chloride 0.9 % 250 mL IVPB        500 mg 250 mL/hr over 60 Minutes Intravenous Every 24 hours 11/09/20 0241         Medication:  . allopurinol  300 mg Oral Daily  . feeding supplement  237  mL Oral BID BM  . potassium chloride  20 mEq Oral Daily  . potassium chloride  40 mEq Oral Once    ALPRAZolam, HYDROcodone-acetaminophen, morphine injection, oxyCODONE   Objective:   Vitals:   11/09/20 1030 11/09/20 1100 11/09/20 1130 11/09/20 1200  BP: 95/68 95/68 (!) 91/59 99/60  Pulse:  98 94 94  Resp: (!) 32 (!) 33 (!) 23 (!) 28  Temp:      TempSrc:      SpO2:  92% 94% 91%  Weight:        Intake/Output Summary (Last 24 hours) at 11/09/2020 1401 Last data filed at 11/09/2020 0608 Gross per 24 hour  Intake 1979.8 ml  Output 800 ml  Net 1179.8 ml   Filed Weights   11/07/2020 1358  Weight: 46.2 kg     Examination:   Physical Exam  Constitution: Lethargic, labored breathing, severely cachectic elderly  female HEENT: On 15 L of oxygen via nonrebreather mask, otherwise HEENT normal Chest:Chest symmetric, diminished breath sounds mid to lower lobe Cardio vascular:  S1/S2, RRR, No murmure, No Rubs or Gallops  pulmonary: Labored respiration scattered wheezes, lower lobe crackles Abdomen: Soft, non-tender, non-distended, bowel sounds,no masses, no organomegaly Muscular skeletal:  Severely generalized weaknesses, Limited exam - in bed, able to move all 4 extremities, Normal strength,  Neuro: Limited exam patient is lethargic CNII-XII intact. , normal motor and sensation, reflexes intact  Extremities: No pitting edema lower extremities, +2 pulses  Skin: Dry, warm to touch, negative for any Rashes, lower extremity superficial wounds Wounds: per nursing documentation    ------------------------------------------------------------------------------------------------------------------------------------------    LABs:  CBC Latest Ref Rng & Units 11/09/2020 10/15/2020 10/21/2020  WBC 4.0 - 10.5 K/uL 30.2(H) 26.1(H) 10.6(H)  Hemoglobin 12.0 - 15.0 g/dL 8.2(L) 7.5(L) 7.6(L)  Hematocrit 36.0 - 46.0 % 24.4(L) 23.2(L) 23.7(L)  Platelets 150 - 400 K/uL 126(L) 209 302   CMP Latest Ref Rng & Units 11/09/2020 10/25/2020 10/21/2020  Glucose 70 - 99 mg/dL 107(H) 119(H) 101(H)  BUN 8 - 23 mg/dL 28(H) 28(H) 10  Creatinine 0.44 - 1.00 mg/dL 1.04(H) 0.99 0.68  Sodium 135 - 145 mmol/L 137 130(L) 132(L)  Potassium 3.5 - 5.1 mmol/L 2.8(L) 2.9(L) 3.8  Chloride 98 - 111 mmol/L 102 94(L) 101  CO2 22 - 32 mmol/L 26 28 26   Calcium 8.9 - 10.3 mg/dL 7.2(L) 7.2(L) 8.2(L)  Total Protein 6.5 - 8.1 g/dL - 5.0(L) 5.4(L)  Total Bilirubin 0.3 - 1.2 mg/dL - 0.7 0.5  Alkaline Phos 38 - 126 U/L - 115 115  AST 15 - 41 U/L - 25 14(L)  ALT 0 - 44 U/L - 14 8       Micro Results Recent Results (from the past 240 hour(s))  Resp Panel by RT-PCR (Flu A&B, Covid) Nasopharyngeal Swab     Status: None   Collection Time: 10/15/2020  4:22  PM   Specimen: Nasopharyngeal Swab; Nasopharyngeal(NP) swabs in vial transport medium  Result Value Ref Range Status   SARS Coronavirus 2 by RT PCR NEGATIVE NEGATIVE Final    Comment: (NOTE) SARS-CoV-2 target nucleic acids are NOT DETECTED.  The SARS-CoV-2 RNA is generally detectable in upper respiratory specimens during the acute phase of infection. The lowest concentration of SARS-CoV-2 viral copies this assay can detect is 138 copies/mL. A negative result does not preclude SARS-Cov-2 infection and should not be used as the sole basis for treatment or other patient management decisions. A negative result may occur with  improper specimen collection/handling, submission  of specimen other than nasopharyngeal swab, presence of viral mutation(s) within the areas targeted by this assay, and inadequate number of viral copies(<138 copies/mL). A negative result must be combined with clinical observations, patient history, and epidemiological information. The expected result is Negative.  Fact Sheet for Patients:  EntrepreneurPulse.com.au  Fact Sheet for Healthcare Providers:  IncredibleEmployment.be  This test is no t yet approved or cleared by the [place] FDA and  has been authorized for detection and/or diagnosis of SARS-CoV-2 by FDA under an Emergency Use Authorization (EUA). This EUA will remain  in effect (meaning this test can be used) for the duration of the COVID-19 declaration under Section 564(b)(1) of the Act, 21 U.S.C.section 360bbb-3(b)(1), unless the authorization is terminated  or revoked sooner.       Influenza A by PCR NEGATIVE NEGATIVE Final   Influenza B by PCR NEGATIVE NEGATIVE Final    Comment: (NOTE) The Xpert Xpress SARS-CoV-2/FLU/RSV plus assay is intended as an aid in the diagnosis of influenza from Nasopharyngeal swab specimens and should not be used as a sole basis for treatment. Nasal washings and aspirates are  unacceptable for Xpert Xpress SARS-CoV-2/FLU/RSV testing.  Fact Sheet for Patients: EntrepreneurPulse.com.au  Fact Sheet for Healthcare Providers: IncredibleEmployment.be  This test is not yet approved or cleared by the [place] FDA and has been authorized for detection and/or diagnosis of SARS-CoV-2 by FDA under an Emergency Use Authorization (EUA). This EUA will remain in effect (meaning this test can be used) for the duration of the COVID-19 declaration under Section 564(b)(1) of the Act, 21 U.S.C. section 360bbb-3(b)(1), unless the authorization is terminated or revoked.  Performed at [facility], [address] [place], [place] [address]     Radiology Reports CT HEAD WO CONTRAST  Result Date: 11/09/2020 CLINICAL DATA:  Follow-up subdural hematoma EXAM: CT HEAD WITHOUT CONTRAST TECHNIQUE: Contiguous axial images were obtained from the base of the skull through the vertex without intravenous contrast. COMPARISON:  11/09/2020 FINDINGS: Brain: No evidence of hydrocephalus, extra-axial collection or mass lesion/mass effect. Unchanged, minimal subdural hematoma about the superior aspect of the right tentorium, measuring no greater than 2 mm in thickness (series 4, image 42). There is a small, adjacent hypodense acute subacute infarction of the inferior right occipital lobe (series 4, image 42, series 2, image 12). Vascular: No hyperdense vessel or unexpected calcification. Skull: Normal. Negative for fracture or focal lesion. Sinuses/Orbits: No acute finding. Other: None. IMPRESSION: 1. Unchanged, minimal subdural hematoma about the superior aspect of the right tentorium, measuring no greater than 2 mm in thickness. 2. There is a small, adjacent hypodense acute subacute infarction of the inferior right occipital lobe, as seen on prior same day examination. Electronically Signed   By: Eddie Candle M.D.   On: 11/09/2020 13:58   CT HEAD WO  CONTRAST  Addendum Date: 11/09/2020   ADDENDUM REPORT: 11/09/2020 08:08 ADDENDUM: Called to review case by technologist. Additional finding of a small right inferior occipital infarct which is recent appearing. Message sent in [facility] chat. Electronically Signed   By: Monte Fantasia M.D.   On: 11/09/2020 08:08   Result Date: 11/09/2020 CLINICAL DATA:  Altered level of consciousness, hypoxia after blood infusion, subdural hematoma EXAM: CT HEAD WITHOUT CONTRAST TECHNIQUE: Contiguous axial images were obtained from the base of the skull through the vertex without intravenous contrast. COMPARISON:  10/18/2020 FINDINGS: Brain: The small right tentorial subdural hematoma measuring less than 2 mm is not appreciably changed since the prior exam. No  new areas of hemorrhage. No acute infarct. Lateral ventricles and midline structures are unremarkable. No mass effect. Vascular: No hyperdense vessel or unexpected calcification. Skull: Small left frontal scalp hematoma. No underlying fracture. The remainder of the calvarium is unremarkable. Sinuses/Orbits: No acute finding. Other: None. IMPRESSION: 1. Stable trace right tentorial subdural hematoma, measuring less than 2 mm in thickness. No mass effect. 2. No acute infarct. Electronically Signed: By: Randa Ngo M.D. On: 11/09/2020 01:24   CT Head Wo Contrast  Result Date: 10/17/2020 CLINICAL DATA:  Cerebral hemorrhage suspected EXAM: CT HEAD WITHOUT CONTRAST TECHNIQUE: Contiguous axial images were obtained from the base of the skull through the vertex without intravenous contrast. COMPARISON:  None. FINDINGS: Brain: There is subdural hemorrhage along the right tentorial leaflet measuring 2 mm in thickness. No midline shift. There is no evidence of large territory infarct. Sequela of chronic small vessel ischemic disease. Vascular: Vascular calcifications. Skull: No acute fracture. Sinuses/Orbits: No acute findings. Other: Small left frontal scalp hematoma measuring 1.6  cm. A there is a subcutaneous cystic lesion along the right parietal scalp with calcification measuring 1.0 cm. IMPRESSION: Subdural hemorrhage along the right tentorial leaflet measuring 2 mm in thickness. No significant mass effect. Small left frontal scalp hematoma. Critical Value/emergent results were called by telephone at the time of interpretation on 10/18/2020 at 7:13 pm to provider JOSEPH ZAMMIT , who verbally acknowledged these results. Electronically Signed   By: Maurine Simmering   On: 11/04/2020 19:15   NM PET Image Restag (PS) Skull Base To Thigh  Result Date: 11/06/2020 CLINICAL DATA:  Subsequent treatment strategy for restaging of lymphoma. Multiple falls in past few weeks. EXAM: NUCLEAR MEDICINE PET SKULL BASE TO THIGH TECHNIQUE: 5.5 mCi F-18 FDG was injected intravenously. Full-ring PET imaging was performed from the skull base to thigh after the radiotracer. CT data was obtained and used for attenuation correction and anatomic localization. Fasting blood glucose: 155 mg/dl COMPARISON:  08/26/2020 FINDINGS: Mediastinal blood pool activity: SUV max 1.5 Liver activity: SUV max 2.8 NECK: No areas of abnormal hypermetabolism. Hypermetabolic cervical nodes have resolved. Incidental CT findings: No cervical adenopathy. Bilateral carotid atherosclerosis. CHEST: No residual thoracic nodal hypermetabolism. Incidental CT findings: No residual thoracic adenopathy. New trace, left larger than right pleural effusions. Left Port-A-Cath tip low SVC. Mild cardiomegaly. Aortic atherosclerosis. ABDOMEN/PELVIS: No splenic hypermetabolism. No residual abdominopelvic hypermetabolism. Index right inguinal node measures 1.3 cm and a S.U.V. max of 1.4 on 258/3. Compare 1.9 cm and a S.U.V. max of 18.5 on the prior. Right external iliac node measures 1.1 cm and a S.U.V. max of 1.6 on 248/3 versus 1.6 cm and a S.U.V. max of 12.4 on the prior. Extensive muscular activity throughout the lower abdomen and pelvis, presumably  secondary to motion after radiopharmaceutical injection. This is most significant in the right psoas, at a S.U.V. max of 6.2. No correlate mass or fluid collection identified. Incidental CT findings: Segment 2 and segment 3 hepatic cysts. Mild renal cortical thinning bilaterally. Left extrarenal pelvis and moderate left hydroureter are similar and presumably secondary to pelvic floor laxity and cystocele. Normal adrenal glands. SKELETON: Hypermetabolism about the posterior left chest wall including at the level of the ninth rib measures a S.U.V. max of 3.3, without correlate displaced rib fracture. No suspicious marrow hypermetabolism. Presumed tracer extravasation within the right antecubital fossa. Incidental CT findings: Remote left rib fractures. IMPRESSION: 1. Complete metabolic response to therapy as detailed above. No residual nodal hypermetabolism and no new sites of disease.  Residual but significantly improved pelvic adenopathy. No adenopathy within the chest or abdomen. ([place]) 1. 2. Tiny bilateral  pleural effusions. 3. Posterior left chest wall hypermetabolism without correlate rib fracture. This is presumably related to recent trauma in this patient with a reported history of multiple falls. 4. Similar left-sided hydroureter, likely secondary to pelvic floor laxity and bladder prolapse. Electronically Signed   By: Abigail Miyamoto M.D.   On: 11/06/2020 11:50   DG CHEST PORT 1 VIEW  Result Date: 11/09/2020 CLINICAL DATA:  Hypoxia, mental status change after blood infusion EXAM: PORTABLE CHEST 1 VIEW COMPARISON:  10/27/2020 FINDINGS: Single frontal view of the chest demonstrates left chest wall port unchanged. Cardiac silhouette is stable. There is increasing consolidation at the lung bases, right greater than left, favor atelectasis over airspace disease. Small right pleural effusion cannot be excluded. No pneumothorax. No acute bony abnormalities. IMPRESSION: 1. Increasing bibasilar consolidation,  right greater than left. 2. Small right pleural effusion. Electronically Signed   By: Randa Ngo M.D.   On: 11/09/2020 01:29   DG Chest Port 1 View  Result Date: 10/14/2020 CLINICAL DATA:  Shortness of breath. EXAM: PORTABLE CHEST 1 VIEW COMPARISON:  September 06, 2020. FINDINGS: Stable cardiomegaly. Left subclavian Port-A-Cath is unchanged in position. No pneumothorax or significant pleural effusion is noted. Right lung is clear. Mild left basilar subsegmental atelectasis is noted. Bony thorax is unremarkable. IMPRESSION: Mild left basilar subsegmental atelectasis. Electronically Signed   By: Marijo Conception M.D.   On: 10/31/2020 15:48    SIGNED: Deatra James, MD, FHM. [facility],  Pager (please use [email or web address] to page/text) Please use Epic Secure Chat for non-urgent communication (7AM-7PM)  If 7PM-7AM, please contact night-coverage [email or web address], 11/09/2020, 2:01 PM

## 2020-11-09 NOTE — ED Notes (Signed)
Swab used to wipe mouth of pt. Got family members soda

## 2020-11-09 NOTE — ED Notes (Signed)
Pt noted to have a stage I decubitus to sacral area.  Pt repositioned and pillows placed to get pt off her buttocks.

## 2020-11-09 NOTE — Progress Notes (Signed)
Dr. Sherral Hammers spoke family and family is wanting to proceed with comfort measures.

## 2020-11-09 NOTE — ED Notes (Signed)
Replaced non-breather at this time as patient has pulled off.

## 2020-11-09 NOTE — Progress Notes (Signed)
Patient had an acute change in mental status and hypoxia. She had just finished a unit of blood. Most likely fluid overloaded after transfusion. Stat CXR ordered. She has a subdural hematoma, so repeat head CT wo contrast also ordered.

## 2020-11-09 NOTE — ED Notes (Signed)
Pt continues to moan out. Pt states she is "miserable".

## 2020-11-09 NOTE — ED Notes (Addendum)
Called for patient in er dropping her saturation. Patient placed on NRB mask by nurse. Patients saturation has increased but tends to go up and down. After checking probe suspect this is reason for fluctuations. Patient does sound rhonchus and rattles with cough. She has received 1 unit of blood. Patient also has a subdural hematoma so BiPAP is not recommended but not totally ruled out.

## 2020-11-09 NOTE — ED Notes (Signed)
Pt to ICU 3.  Family educated on visitation policy etc.  This nurse answered family questions to the best of my ability.

## 2020-11-09 NOTE — ED Notes (Signed)
Family politely reminded to stay in the patients room for safety and patient privacy due to the ER census being elevated at this time.

## 2020-11-09 NOTE — Progress Notes (Signed)
PROGRESS NOTE    Erica Mora  ZHY:[id number] DOB: [birth year] DOA: 10/16/2020 PCP: Asencion Noble, MD     Brief Narrative:  80 y.o. WF PMHx recent diagnosis of diffuse large B-cell lymphoma, s/p Port-A-Cath placement, and currently on chemotherapy,   Presents to ED secondary to multiple complaints, including generalized weakness, falls, and shortness of breath, patient and husband at bedside report patient with poor appetite, not eating much for the last few days, urinary incontinence, as well some intermittent diarrhea, will report he tried to wake up up this morning, but she was very difficult to wake up, by the time she was in ED she was awake and appropriate, but she is extremely frail, report generalized weakness, nothing focal, no fever, no chills, no cough -In ED her work-up significant for 130, hypokalemia of 2.9, low albumin at 1.9, lactic acid at 2.2, white blood cell count of 26,000, hemoglobin at 7.5, ED physician discussed with hematology, report leukocytosis most likely related to Neulasta she received, CT head significant for small subdural hematoma due to fall she sustained couple days ago, has neurosurgery on-call who recommended repeat CT head in 24 hours, [facility]ist consulted to admit.  Subjective: Patient awake will nod yes and no to questions but basically want to be left alone.  Uncomfortable.   Assessment & Plan: Covid vaccination;   Active Problems:   DLBCL (diffuse large B cell lymphoma) (HCC)   Dehydration   Anemia, unspecified   Subdural hematoma (HCC)   Failure to thrive in adult  Failure to thrive/generalized weakness/falls/protein calorie malnutrition -Spoke at length with family members and they have decided to make patient comfort care  Hypokalemia -Repleted check in a.m. patient comfort care  Subdural hematoma -patient comfort care  Diffuse large B-cell lymphoma -On chemotherapy, followed by Dr. Delton Coombes -patient comfort  care  Hyponatremia -patient comfort care  Lactic acidosis -patient comfort care  Elevated  troponins  patient comfort care  Leukocytosis patient comfort care  Anemia -patient comfort care  Left lower extremity wound -patient comfort care          DVT prophylaxis: patient comfort care Code Status: DNR Family Communication: 5/28 multiple family members at bedside to include husband, daughter, son, all part of the discussion of plan of care. Status is: Inpatient    Dispo: The patient is from: Home              Anticipated d/c is to: Hospital death              Anticipated d/c date is:??              Patient currently unstable      I have personally reviewed and interpreted all radiology studies and my findings are as above.     Continuous Infusions: . sodium chloride Stopped (10/26/2020 2232)  . azithromycin Stopped (11/09/20 0509)  . cefTRIAXone (ROCEPHIN)  IV Stopped (11/09/20 0400)     Objective: Vitals:   11/09/20 0930 11/09/20 1000 11/09/20 1030 11/09/20 1100  BP: (!) 97/58 106/67 95/68 95/68   Pulse: 94 93  98  Resp: (!) 32 (!) 32 (!) 32 (!) 33  Temp:      TempSrc:      SpO2: 92% 92%  92%  Weight:        Intake/Output Summary (Last 24 hours) at 11/09/2020 1106 Last data filed at 11/09/2020 2952 Gross per 24 hour  Intake 1979.8 ml  Output 800 ml  Net 1179.8 ml   [employer]  11/02/2020 1358  Weight: 46.2 kg    Examination:  General: Alert but uncomfortable, No acute respiratory distress Eyes: negative scleral hemorrhage, negative anisocoria, negative icterus ENT: Negative Runny nose, negative gingival bleeding, Neck:  Negative scars, masses, torticollis, lymphadenopathy, JVD Lungs: decreased breath sounds bilaterally without wheezes or crackles Cardiovascular: Regular rate and rhythm without murmur gallop or rub normal S1 and S2 Abdomen: negative abdominal pain, nondistended, positive soft, bowel sounds, no rebound, no ascites,  no appreciable mass Extremities: No significant cyanosis, clubbing, or edema bilateral lower extremities Skin: Negative rashes, lesions, ulcers Psychiatric:  Negative depression, negative anxiety, negative fatigue, negative mania  Central nervous system:  Cranial nerves II through XII intact, tongue/uvula midline, all extremities muscle strength 5/5, sensation intact throughout, negative dysarthria, negative expressive aphasia, negative receptive aphasia.  .     Data Reviewed: Care during the described time interval was provided by me .  I have reviewed this patient's available data, including medical history, events of note, physical examination, and all test results as part of my evaluation.  CBC: Recent Labs  Lab 11/04/2020 1551 11/09/20 0539  WBC 26.1* 30.2*  NEUTROABS 23.2*  --   HGB 7.5* 8.2*  HCT 23.2* 24.4*  MCV 86.6 84.7  PLT 209 973*   Basic Metabolic Panel: Recent Labs  Lab 10/28/2020 1551 11/09/20 0539  NA 130* 137  K 2.9* 2.8*  CL 94* 102  CO2 28 26  GLUCOSE 119* 107*  BUN 28* 28*  CREATININE 0.99 1.04*  CALCIUM 7.2* 7.2*   GFR: Estimated Creatinine Clearance: 31 mL/min (A) (by C-G formula based on SCr of 1.04 mg/dL (H)). Liver Function Tests: Recent Labs  Lab 10/22/2020 1551  AST 25  ALT 14  ALKPHOS 115  BILITOT 0.7  PROT 5.0*  ALBUMIN 1.9*   No results for input(s): LIPASE, AMYLASE in the last 168 hours. No results for input(s): AMMONIA in the last 168 hours. Coagulation Profile: No results for input(s): INR, PROTIME in the last 168 hours. Cardiac Enzymes: No results for input(s): CKTOTAL, CKMB, CKMBINDEX, TROPONINI in the last 168 hours. BNP (last 3 results) No results for input(s): PROBNP in the last 8760 hours. HbA1C: No results for input(s): HGBA1C in the last 72 hours. CBG: No results for input(s): GLUCAP in the last 168 hours. Lipid Profile: No results for input(s): CHOL, HDL, LDLCALC, TRIG, CHOLHDL, LDLDIRECT in the last 72  hours. Thyroid Function Tests: No results for input(s): TSH, T4TOTAL, FREET4, T3FREE, THYROIDAB in the last 72 hours. Anemia Panel: No results for input(s): VITAMINB12, FOLATE, FERRITIN, TIBC, IRON, RETICCTPCT in the last 72 hours. Sepsis Labs: Recent Labs  Lab 10/18/2020 1552  LATICACIDVEN 2.2*    Recent Results (from the past 240 hour(s))  Resp Panel by RT-PCR (Flu A&B, Covid) Nasopharyngeal Swab     Status: None   Collection Time: 10/18/2020  4:22 PM   Specimen: Nasopharyngeal Swab; Nasopharyngeal(NP) swabs in vial transport medium  Result Value Ref Range Status   SARS Coronavirus 2 by RT PCR NEGATIVE NEGATIVE Final    Comment: (NOTE) SARS-CoV-2 target nucleic acids are NOT DETECTED.  The SARS-CoV-2 RNA is generally detectable in upper respiratory specimens during the acute phase of infection. The lowest concentration of SARS-CoV-2 viral copies this assay can detect is 138 copies/mL. A negative result does not preclude SARS-Cov-2 infection and should not be used as the sole basis for treatment or other patient management decisions. A negative result may occur with  improper specimen collection/handling, submission of specimen other  than nasopharyngeal swab, presence of viral mutation(s) within the areas targeted by this assay, and inadequate number of viral copies(<138 copies/mL). A negative result must be combined with clinical observations, patient history, and epidemiological information. The expected result is Negative.  Fact Sheet for Patients:  EntrepreneurPulse.com.au  Fact Sheet for Healthcare Providers:  IncredibleEmployment.be  This test is no t yet approved or cleared by the [place] FDA and  has been authorized for detection and/or diagnosis of SARS-CoV-2 by FDA under an Emergency Use Authorization (EUA). This EUA will remain  in effect (meaning this test can be used) for the duration of the COVID-19 declaration under  Section 564(b)(1) of the Act, 21 U.S.C.section 360bbb-3(b)(1), unless the authorization is terminated  or revoked sooner.       Influenza A by PCR NEGATIVE NEGATIVE Final   Influenza B by PCR NEGATIVE NEGATIVE Final    Comment: (NOTE) The Xpert Xpress SARS-CoV-2/FLU/RSV plus assay is intended as an aid in the diagnosis of influenza from Nasopharyngeal swab specimens and should not be used as a sole basis for treatment. Nasal washings and aspirates are unacceptable for Xpert Xpress SARS-CoV-2/FLU/RSV testing.  Fact Sheet for Patients: EntrepreneurPulse.com.au  Fact Sheet for Healthcare Providers: IncredibleEmployment.be  This test is not yet approved or cleared by the [place] FDA and has been authorized for detection and/or diagnosis of SARS-CoV-2 by FDA under an Emergency Use Authorization (EUA). This EUA will remain in effect (meaning this test can be used) for the duration of the COVID-19 declaration under Section 564(b)(1) of the Act, 21 U.S.C. section 360bbb-3(b)(1), unless the authorization is terminated or revoked.  Performed at [facility], [address], [place], [place] [address]          Radiology Studies: CT HEAD WO CONTRAST  Addendum Date: 11/09/2020   ADDENDUM REPORT: 11/09/2020 08:08 ADDENDUM: Called to review case by technologist. Additional finding of a small right inferior occipital infarct which is recent appearing. Message sent in [facility] chat. Electronically Signed   By: Monte Fantasia M.D.   On: 11/09/2020 08:08   Result Date: 11/09/2020 CLINICAL DATA:  Altered level of consciousness, hypoxia after blood infusion, subdural hematoma EXAM: CT HEAD WITHOUT CONTRAST TECHNIQUE: Contiguous axial images were obtained from the base of the skull through the vertex without intravenous contrast. COMPARISON:  10/31/2020 FINDINGS: Brain: The small right tentorial subdural hematoma measuring less than 2 mm is not appreciably  changed since the prior exam. No new areas of hemorrhage. No acute infarct. Lateral ventricles and midline structures are unremarkable. No mass effect. Vascular: No hyperdense vessel or unexpected calcification. Skull: Small left frontal scalp hematoma. No underlying fracture. The remainder of the calvarium is unremarkable. Sinuses/Orbits: No acute finding. Other: None. IMPRESSION: 1. Stable trace right tentorial subdural hematoma, measuring less than 2 mm in thickness. No mass effect. 2. No acute infarct. Electronically Signed: By: Randa Ngo M.D. On: 11/09/2020 01:24   CT Head Wo Contrast  Result Date: 10/28/2020 CLINICAL DATA:  Cerebral hemorrhage suspected EXAM: CT HEAD WITHOUT CONTRAST TECHNIQUE: Contiguous axial images were obtained from the base of the skull through the vertex without intravenous contrast. COMPARISON:  None. FINDINGS: Brain: There is subdural hemorrhage along the right tentorial leaflet measuring 2 mm in thickness. No midline shift. There is no evidence of large territory infarct. Sequela of chronic small vessel ischemic disease. Vascular: Vascular calcifications. Skull: No acute fracture. Sinuses/Orbits: No acute findings. Other: Small left frontal scalp hematoma measuring 1.6 cm. A there is a subcutaneous  cystic lesion along the right parietal scalp with calcification measuring 1.0 cm. IMPRESSION: Subdural hemorrhage along the right tentorial leaflet measuring 2 mm in thickness. No significant mass effect. Small left frontal scalp hematoma. Critical Value/emergent results were called by telephone at the time of interpretation on 10/21/2020 at 7:13 pm to provider JOSEPH ZAMMIT , who verbally acknowledged these results. Electronically Signed   By: Maurine Simmering   On: 11/03/2020 19:15   DG CHEST PORT 1 VIEW  Result Date: 11/09/2020 CLINICAL DATA:  Hypoxia, mental status change after blood infusion EXAM: PORTABLE CHEST 1 VIEW COMPARISON:  10/18/2020 FINDINGS: Single frontal view of the  chest demonstrates left chest wall port unchanged. Cardiac silhouette is stable. There is increasing consolidation at the lung bases, right greater than left, favor atelectasis over airspace disease. Small right pleural effusion cannot be excluded. No pneumothorax. No acute bony abnormalities. IMPRESSION: 1. Increasing bibasilar consolidation, right greater than left. 2. Small right pleural effusion. Electronically Signed   By: Randa Ngo M.D.   On: 11/09/2020 01:29   DG Chest Port 1 View  Result Date: 11/07/2020 CLINICAL DATA:  Shortness of breath. EXAM: PORTABLE CHEST 1 VIEW COMPARISON:  September 06, 2020. FINDINGS: Stable cardiomegaly. Left subclavian Port-A-Cath is unchanged in position. No pneumothorax or significant pleural effusion is noted. Right lung is clear. Mild left basilar subsegmental atelectasis is noted. Bony thorax is unremarkable. IMPRESSION: Mild left basilar subsegmental atelectasis. Electronically Signed   By: Marijo Conception M.D.   On: 10/18/2020 15:48        Scheduled Meds: . allopurinol  300 mg Oral Daily  . feeding supplement  237 mL Oral BID BM  . potassium chloride  20 mEq Oral Daily  . potassium chloride  40 mEq Oral Once   Continuous Infusions: . sodium chloride Stopped (10/24/2020 2232)  . azithromycin Stopped (11/09/20 0509)  . cefTRIAXone (ROCEPHIN)  IV Stopped (11/09/20 0400)     LOS: 0 days    Time spent:40 min    Kimoni Pagliarulo, Geraldo Docker, MD [facility]   If 7PM-7AM, please contact night-coverage 11/09/2020, 11:06 AM

## 2020-11-11 DIAGNOSIS — C8338 Diffuse large B-cell lymphoma, lymph nodes of multiple sites: Secondary | ICD-10-CM

## 2020-11-11 DIAGNOSIS — R627 Adult failure to thrive: Secondary | ICD-10-CM | POA: Diagnosis not present

## 2020-11-13 ENCOUNTER — Ambulatory Visit (HOSPITAL_COMMUNITY): Payer: Medicare Other | Admitting: Hematology

## 2020-11-13 ENCOUNTER — Other Ambulatory Visit (HOSPITAL_COMMUNITY): Payer: Medicare Other

## 2020-11-13 ENCOUNTER — Ambulatory Visit (HOSPITAL_COMMUNITY): Payer: Medicare Other

## 2020-11-13 NOTE — Progress Notes (Signed)
Patient found with no pulse or respirations.  LPN/RN  Confirmed at 2021.  MD on call notified.  Family present in room with patient.

## 2020-11-13 NOTE — Progress Notes (Signed)
Patient's family concerned that patient is not as easily aroused as she was the day before. According to family, patient was able to speak to them yesterday and today, after this RN gave patient morphine, patient is not waking up. Family members educated that morphine was given due to patient's increased heart rate as well as increased respiratory rate and to help keep patient as comfortable as possible. Family members also educated that with patient's declining oxygenation status, that she may not arouse like she did yesterday. Family members verbalized understanding.

## 2020-11-13 NOTE — Progress Notes (Signed)
Patient found with no pulse or respirations. LPN/RN confirmed at 0277.

## 2020-11-13 NOTE — Progress Notes (Signed)
PROGRESS NOTE    Erica Mora  YFV:[id number] DOB: Jun 17, 1940 DOA: 10/31/2020 PCP: Asencion Noble, MD     Brief Narrative:  80 y.o. WF PMHx recent diagnosis of diffuse large B-cell lymphoma, s/p Port-A-Cath placement, and currently on chemotherapy,   Presents to ED secondary to multiple complaints, including generalized weakness, falls, and shortness of breath, patient and husband at bedside report patient with poor appetite, not eating much for the last few days, urinary incontinence, as well some intermittent diarrhea, will report he tried to wake up up this morning, but she was very difficult to wake up, by the time she was in ED she was awake and appropriate, but she is extremely frail, report generalized weakness, nothing focal, no fever, no chills, no cough -In ED her work-up significant for 130, hypokalemia of 2.9, low albumin at 1.9, lactic acid at 2.2, white blood cell count of 26,000, hemoglobin at 7.5, ED physician discussed with hematology, report leukocytosis most likely related to Neulasta she received, CT head significant for small subdural hematoma due to fall she sustained couple days ago, has neurosurgery on-call who recommended repeat CT head in 24 hours, [facility] hospitalist consulted to admit.  Subjective: 5/29 afebrile overnight patient appears to be resting comfortably.  Family members at bedside state patient not as interactive today as yesterday however they feel that she is comfortable.    Assessment & Plan: Covid vaccination;   Active Problems:   DLBCL (diffuse large B cell lymphoma) (HCC)   Dehydration   Anemia, unspecified   Subdural hematoma (HCC)   Failure to thrive in adult  Failure to thrive/generalized weakness/falls/protein calorie malnutrition -Spoke at length with family members and they have decided to make patient comfort care  Hypokalemia -Repleted check in a.m. patient comfort care  Subdural hematoma -patient comfort care  Diffuse large  B-cell lymphoma -On chemotherapy, followed by Dr. Delton Coombes -patient comfort care  Hyponatremia -patient comfort care  Lactic acidosis -patient comfort care  Elevated  troponins  patient comfort care  Leukocytosis patient comfort care  Anemia -patient comfort care  Left lower extremity wound -patient comfort care Pressure Injury 11/09/20 Heel Right Deep Tissue Pressure Injury - Purple or maroon localized area of discolored intact skin or blood-filled blister due to damage of underlying soft tissue from pressure and/or shear. 3x2 (Active)  11/09/20 1700  Location: Heel  Location Orientation: Right  Staging: Deep Tissue Pressure Injury - Purple or maroon localized area of discolored intact skin or blood-filled blister due to damage of underlying soft tissue from pressure and/or shear.  Wound Description (Comments): 3x2  Present on Admission: Yes     Pressure Injury 11/09/20 Heel Left Deep Tissue Pressure Injury - Purple or maroon localized area of discolored intact skin or blood-filled blister due to damage of underlying soft tissue from pressure and/or shear. 4x2 (Active)  11/09/20 1700  Location: Heel  Location Orientation: Left  Staging: Deep Tissue Pressure Injury - Purple or maroon localized area of discolored intact skin or blood-filled blister due to damage of underlying soft tissue from pressure and/or shear.  Wound Description (Comments): 4x2  Present on Admission: Yes     Pressure Injury 11/09/20 Sacrum Deep Tissue Pressure Injury - Purple or maroon localized area of discolored intact skin or blood-filled blister due to damage of underlying soft tissue from pressure and/or shear. 5x3 (Active)  11/09/20 1700  Location: Sacrum  Location Orientation:   Staging: Deep Tissue Pressure Injury - Purple or maroon localized area of discolored intact skin  or blood-filled blister due to damage of underlying soft tissue from pressure and/or shear.  Wound Description  (Comments): 5x3  Present on Admission: Yes          DVT prophylaxis: patient comfort care Code Status: DNR Family Communication: 5/28 multiple family members at bedside to include husband, daughter, son, all part of the discussion of plan of care. Status is: Inpatient    Dispo: The patient is from: Home              Anticipated d/c is to: Hospital death              Anticipated d/c date is:??              Patient currently unstable      I have personally reviewed and interpreted all radiology studies and my findings are as above.     Continuous Infusions: . sodium chloride Stopped (10/21/2020 2232)     Objective: Vitals:   2020-12-06 0300 December 06, 2020 0400 2020-12-06 0500 12-06-2020 0600  BP:      Pulse: 91 95 (!) 102 99  Resp: 20 (!) 21 (!) 22 20  Temp:      TempSrc:      SpO2: (!) 78% (!) 78% (!) 77% (!) 76%  Weight:       No intake or output data in the 24 hours ending 12/06/20 0806 Filed Weights   11/12/2020 1358  Weight: 46.2 kg    Examination:  General: Alert but uncomfortable, No acute respiratory distress Eyes: negative scleral hemorrhage, negative anisocoria, negative icterus ENT: Negative Runny nose, negative gingival bleeding, Neck:  Negative scars, masses, torticollis, lymphadenopathy, JVD Lungs: decreased breath sounds bilaterally without wheezes or crackles Cardiovascular: Regular rate and rhythm without murmur gallop or rub normal S1 and S2 Abdomen: negative abdominal pain, nondistended, positive soft, bowel sounds, no rebound, no ascites, no appreciable mass Extremities: No significant cyanosis, clubbing, or edema bilateral lower extremities Skin: Negative rashes, lesions, ulcers Psychiatric:  Negative depression, negative anxiety, negative fatigue, negative mania  Central nervous system:  Cranial nerves II through XII intact, tongue/uvula midline, all extremities muscle strength 5/5, sensation intact throughout, negative dysarthria, negative  expressive aphasia, negative receptive aphasia.  .     Data Reviewed: Care during the described time interval was provided by me .  I have reviewed this patient's available data, including medical history, events of note, physical examination, and all test results as part of my evaluation.  CBC: Recent Labs  Lab 10/14/2020 1551 11/09/20 0539 11/09/20 1411  WBC 26.1* 30.2* 27.1*  NEUTROABS 23.2*  --  19.3*  HGB 7.5* 8.2* 8.3*  HCT 23.2* 24.4* 25.1*  MCV 86.6 84.7 86.3  PLT 209 126* 263*   Basic Metabolic Panel: Recent Labs  Lab 10/28/2020 1551 11/09/20 0539 11/09/20 1411  NA 130* 137 135  K 2.9* 2.8* 2.9*  CL 94* 102 101  CO2 28 26 26   GLUCOSE 119* 107* 101*  BUN 28* 28* 31*  CREATININE 0.99 1.04* 1.01*  CALCIUM 7.2* 7.2* 7.0*  MG  --   --  1.7   GFR: Estimated Creatinine Clearance: 31.9 mL/min (A) (by C-G formula based on SCr of 1.01 mg/dL (H)). Liver Function Tests: Recent Labs  Lab 11/09/2020 1551 11/09/20 1411  AST 25 23  ALT 14 12  ALKPHOS 115 108  BILITOT 0.7 0.8  PROT 5.0* 4.4*  ALBUMIN 1.9* 1.6*   No results for input(s): LIPASE, AMYLASE in the last 168 hours. No results  for input(s): AMMONIA in the last 168 hours. Coagulation Profile: No results for input(s): INR, PROTIME in the last 168 hours. Cardiac Enzymes: No results for input(s): CKTOTAL, CKMB, CKMBINDEX, TROPONINI in the last 168 hours. BNP (last 3 results) No results for input(s): PROBNP in the last 8760 hours. HbA1C: No results for input(s): HGBA1C in the last 72 hours. CBG: No results for input(s): GLUCAP in the last 168 hours. Lipid Profile: No results for input(s): CHOL, HDL, LDLCALC, TRIG, CHOLHDL, LDLDIRECT in the last 72 hours. Thyroid Function Tests: No results for input(s): TSH, T4TOTAL, FREET4, T3FREE, THYROIDAB in the last 72 hours. Anemia Panel: No results for input(s): VITAMINB12, FOLATE, FERRITIN, TIBC, IRON, RETICCTPCT in the last 72 hours. Sepsis Labs: Recent Labs  Lab  10/30/2020 1552  LATICACIDVEN 2.2*    Recent Results (from the past 240 hour(s))  Resp Panel by RT-PCR (Flu A&B, Covid) Nasopharyngeal Swab     Status: None   Collection Time: 11/06/2020  4:22 PM   Specimen: Nasopharyngeal Swab; Nasopharyngeal(NP) swabs in vial transport medium  Result Value Ref Range Status   SARS Coronavirus 2 by RT PCR NEGATIVE NEGATIVE Final    Comment: (NOTE) SARS-CoV-2 target nucleic acids are NOT DETECTED.  The SARS-CoV-2 RNA is generally detectable in upper respiratory specimens during the acute phase of infection. The lowest concentration of SARS-CoV-2 viral copies this assay can detect is 138 copies/mL. A negative result does not preclude SARS-Cov-2 infection and should not be used as the sole basis for treatment or other patient management decisions. A negative result may occur with  improper specimen collection/handling, submission of specimen other than nasopharyngeal swab, presence of viral mutation(s) within the areas targeted by this assay, and inadequate number of viral copies(<138 copies/mL). A negative result must be combined with clinical observations, patient history, and epidemiological information. The expected result is Negative.  Fact Sheet for Patients:  EntrepreneurPulse.com.au  Fact Sheet for Healthcare Providers:  IncredibleEmployment.be  This test is no t yet approved or cleared by the [place] FDA and  has been authorized for detection and/or diagnosis of SARS-CoV-2 by FDA under an Emergency Use Authorization (EUA). This EUA will remain  in effect (meaning this test can be used) for the duration of the COVID-19 declaration under Section 564(b)(1) of the Act, 21 U.S.C.section 360bbb-3(b)(1), unless the authorization is terminated  or revoked sooner.       Influenza A by PCR NEGATIVE NEGATIVE Final   Influenza B by PCR NEGATIVE NEGATIVE Final    Comment: (NOTE) The Xpert Xpress  SARS-CoV-2/FLU/RSV plus assay is intended as an aid in the diagnosis of influenza from Nasopharyngeal swab specimens and should not be used as a sole basis for treatment. Nasal washings and aspirates are unacceptable for Xpert Xpress SARS-CoV-2/FLU/RSV testing.  Fact Sheet for Patients: EntrepreneurPulse.com.au  Fact Sheet for Healthcare Providers: IncredibleEmployment.be  This test is not yet approved or cleared by the [place] FDA and has been authorized for detection and/or diagnosis of SARS-CoV-2 by FDA under an Emergency Use Authorization (EUA). This EUA will remain in effect (meaning this test can be used) for the duration of the COVID-19 declaration under Section 564(b)(1) of the Act, 21 U.S.C. section 360bbb-3(b)(1), unless the authorization is terminated or revoked.  Performed at [facility], [address], [place], [place] [address]          Radiology Studies: CT HEAD WO CONTRAST  Result Date: 11/09/2020 CLINICAL DATA:  Follow-up subdural hematoma EXAM: CT HEAD WITHOUT CONTRAST TECHNIQUE: Contiguous  axial images were obtained from the base of the skull through the vertex without intravenous contrast. COMPARISON:  11/09/2020 FINDINGS: Brain: No evidence of hydrocephalus, extra-axial collection or mass lesion/mass effect. Unchanged, minimal subdural hematoma about the superior aspect of the right tentorium, measuring no greater than 2 mm in thickness (series 4, image 42). There is a small, adjacent hypodense acute subacute infarction of the inferior right occipital lobe (series 4, image 42, series 2, image 12). Vascular: No hyperdense vessel or unexpected calcification. Skull: Normal. Negative for fracture or focal lesion. Sinuses/Orbits: No acute finding. Other: None. IMPRESSION: 1. Unchanged, minimal subdural hematoma about the superior aspect of the right tentorium, measuring no greater than 2 mm in thickness. 2. There is a small,  adjacent hypodense acute subacute infarction of the inferior right occipital lobe, as seen on prior same day examination. Electronically Signed   By: Eddie Candle M.D.   On: 11/09/2020 13:58   CT HEAD WO CONTRAST  Addendum Date: 11/09/2020   ADDENDUM REPORT: 11/09/2020 08:08 ADDENDUM: Called to review case by technologist. Additional finding of a small right inferior occipital infarct which is recent appearing. Message sent in [facility] chat. Electronically Signed   By: Monte Fantasia M.D.   On: 11/09/2020 08:08   Result Date: 11/09/2020 CLINICAL DATA:  Altered level of consciousness, hypoxia after blood infusion, subdural hematoma EXAM: CT HEAD WITHOUT CONTRAST TECHNIQUE: Contiguous axial images were obtained from the base of the skull through the vertex without intravenous contrast. COMPARISON:  10/24/2020 FINDINGS: Brain: The small right tentorial subdural hematoma measuring less than 2 mm is not appreciably changed since the prior exam. No new areas of hemorrhage. No acute infarct. Lateral ventricles and midline structures are unremarkable. No mass effect. Vascular: No hyperdense vessel or unexpected calcification. Skull: Small left frontal scalp hematoma. No underlying fracture. The remainder of the calvarium is unremarkable. Sinuses/Orbits: No acute finding. Other: None. IMPRESSION: 1. Stable trace right tentorial subdural hematoma, measuring less than 2 mm in thickness. No mass effect. 2. No acute infarct. Electronically Signed: By: Randa Ngo M.D. On: 11/09/2020 01:24   CT Head Wo Contrast  Result Date: 10/30/2020 CLINICAL DATA:  Cerebral hemorrhage suspected EXAM: CT HEAD WITHOUT CONTRAST TECHNIQUE: Contiguous axial images were obtained from the base of the skull through the vertex without intravenous contrast. COMPARISON:  None. FINDINGS: Brain: There is subdural hemorrhage along the right tentorial leaflet measuring 2 mm in thickness. No midline shift. There is no evidence of large territory  infarct. Sequela of chronic small vessel ischemic disease. Vascular: Vascular calcifications. Skull: No acute fracture. Sinuses/Orbits: No acute findings. Other: Small left frontal scalp hematoma measuring 1.6 cm. A there is a subcutaneous cystic lesion along the right parietal scalp with calcification measuring 1.0 cm. IMPRESSION: Subdural hemorrhage along the right tentorial leaflet measuring 2 mm in thickness. No significant mass effect. Small left frontal scalp hematoma. Critical Value/emergent results were called by telephone at the time of interpretation on 11/02/2020 at 7:13 pm to provider JOSEPH ZAMMIT , who verbally acknowledged these results. Electronically Signed   By: Maurine Simmering   On: 10/26/2020 19:15   DG CHEST PORT 1 VIEW  Result Date: 11/09/2020 CLINICAL DATA:  Hypoxia, mental status change after blood infusion EXAM: PORTABLE CHEST 1 VIEW COMPARISON:  11/07/2020 FINDINGS: Single frontal view of the chest demonstrates left chest wall port unchanged. Cardiac silhouette is stable. There is increasing consolidation at the lung bases, right greater than left, favor atelectasis over airspace disease. Small right pleural  effusion cannot be excluded. No pneumothorax. No acute bony abnormalities. IMPRESSION: 1. Increasing bibasilar consolidation, right greater than left. 2. Small right pleural effusion. Electronically Signed   By: Randa Ngo M.D.   On: 11/09/2020 01:29   DG Chest Port 1 View  Result Date: 10/30/2020 CLINICAL DATA:  Shortness of breath. EXAM: PORTABLE CHEST 1 VIEW COMPARISON:  September 06, 2020. FINDINGS: Stable cardiomegaly. Left subclavian Port-A-Cath is unchanged in position. No pneumothorax or significant pleural effusion is noted. Right lung is clear. Mild left basilar subsegmental atelectasis is noted. Bony thorax is unremarkable. IMPRESSION: Mild left basilar subsegmental atelectasis. Electronically Signed   By: Marijo Conception M.D.   On: 10/29/2020 15:48        Scheduled  Meds: . Chlorhexidine Gluconate Cloth  6 each Topical Daily  . feeding supplement  237 mL Oral BID BM  . mouth rinse  15 mL Mouth Rinse BID  . pantoprazole (PROTONIX) IV  40 mg Intravenous Q24H  . potassium chloride  40 mEq Oral Once   Continuous Infusions: . sodium chloride Stopped (11/06/2020 2232)     LOS: 1 day    Time spent: 10 min    Haddy Mullinax, Geraldo Docker, MD [facility]   If 7PM-7AM, please contact night-coverage 2020-11-29, 8:06 AM

## 2020-11-13 DEATH — deceased

## 2020-11-14 ENCOUNTER — Encounter (HOSPITAL_COMMUNITY): Payer: Medicare Other | Admitting: Dietician

## 2020-11-15 ENCOUNTER — Ambulatory Visit (HOSPITAL_COMMUNITY): Payer: Medicare Other

## 2020-12-04 ENCOUNTER — Ambulatory Visit (HOSPITAL_COMMUNITY): Payer: Medicare Other

## 2020-12-04 ENCOUNTER — Other Ambulatory Visit (HOSPITAL_COMMUNITY): Payer: Medicare Other

## 2020-12-04 ENCOUNTER — Ambulatory Visit (HOSPITAL_COMMUNITY): Payer: Medicare Other | Admitting: Hematology

## 2020-12-06 ENCOUNTER — Ambulatory Visit (HOSPITAL_COMMUNITY): Payer: Medicare Other

## 2020-12-13 NOTE — Death Summary Note (Signed)
Death Summary  Leveta Wahab WIO:[id number] DOB: Jun 17, 1940 DOA: Nov 12, 2020  PCP: Asencion Noble, MD PCP/Office notified: No  Admit date: Nov 12, 2020 Date of Death: 12/07/20  Final Diagnoses:  Active Problems:   DLBCL (diffuse large B cell lymphoma) (HCC)   Dehydration   Anemia, unspecified   Subdural hematoma (HCC)   Failure to thrive in adult  Failure to thrive/generalized weakness/falls/protein calorie malnutrition -Spoke at length with family members and they have decided to make patient comfort care   Hypokalemia -Repleted check in a.m.  patient comfort care   Subdural hematoma -patient comfort care   Diffuse large B-cell lymphoma -On chemotherapy, followed by Dr. Delton Coombes -patient comfort care   Hyponatremia -patient comfort care   Lactic acidosis -patient comfort care   Elevated  troponins patient comfort care    Leukocytosis patient comfort care   Anemia -patient comfort care   Left lower extremity wound -patient comfort care Pressure Injury 11/09/20 Heel Right Deep Tissue Pressure Injury - Purple or maroon localized area of discolored intact skin or blood-filled blister due to damage of underlying soft tissue from pressure and/or shear. 3x2 (Active)  11/09/20 1700  Location: Heel  Location Orientation: Right  Staging: Deep Tissue Pressure Injury - Purple or maroon localized area of discolored intact skin or blood-filled blister due to damage of underlying soft tissue from pressure and/or shear.  Wound Description (Comments): 3x2  Present on Admission: Yes     Pressure Injury 11/09/20 Heel Left Deep Tissue Pressure Injury - Purple or maroon localized area of discolored intact skin or blood-filled blister due to damage of underlying soft tissue from pressure and/or shear. 4x2 (Active)  11/09/20 1700  Location: Heel  Location Orientation: Left  Staging: Deep Tissue Pressure Injury - Purple or maroon localized area of discolored intact skin or  blood-filled blister due to damage of underlying soft tissue from pressure and/or shear.  Wound Description (Comments): 4x2  Present on Admission: Yes     Pressure Injury 11/09/20 Sacrum Deep Tissue Pressure Injury - Purple or maroon localized area of discolored intact skin or blood-filled blister due to damage of underlying soft tissue from pressure and/or shear. 5x3 (Active)  11/09/20 1700  Location: Sacrum  Location Orientation:  Staging: Deep Tissue Pressure Injury - Purple or maroon localized area of discolored intact skin or blood-filled blister due to damage of underlying soft tissue from pressure and/or shear.  Wound Description (Comments): 5x3  Present on Admission: Yes   History of present illness:  80 y.o. WF PMHx recent diagnosis of diffuse large B-cell lymphoma, s/p Port-A-Cath placement, and currently on chemotherapy,   Presents to ED secondary to multiple complaints, including generalized weakness, falls, and shortness of breath, patient and husband at bedside report patient with poor appetite, not eating much for the last few days, urinary incontinence, as well some intermittent diarrhea, will report he tried to wake up up this morning, but she was very difficult to wake up, by the time she was in ED she was awake and appropriate, but she is extremely frail, report generalized weakness, nothing focal, no fever, no chills, no cough -In ED her work-up significant for 130, hypokalemia of 2.9, low albumin at 1.9, lactic acid at 2.2, white blood cell count of 26,000, hemoglobin at 7.5, ED physician discussed with hematology, report leukocytosis most likely related to Neulasta she received, CT head significant for small subdural hematoma due to fall she sustained couple days ago, has neurosurgery on-call who recommended repeat CT head in 24 hours,  [facility] hospitalist consulted to admit.   Subjective: 5/29 afebrile overnight patient appears to be resting comfortably.  Family members at  bedside state patient not as interactive today as yesterday however they feel that she is comfortable.      Hospital Course:  See above; despite maximal medical treatment patient continue to decline.  Family decided to make patient comfort care.  Patient expired at 2021  Time: 2021  Signed:  Dia Crawford, MD [facility]

## 2022-03-06 IMAGING — PT NM PET TUM IMG RESTAG (PS) SKULL BASE T - THIGH
3 series · 18 of 30 positions shown · non-contrast
Comparison: 08/26/2020

CLINICAL DATA: Subsequent treatment strategy for restaging of
lymphoma. Multiple falls in past few weeks.

EXAM:
NUCLEAR MEDICINE PET SKULL BASE TO THIGH
TECHNIQUE: 5.5 mCi F-18 FDG was injected intravenously. Full-ring PET imaging
was performed from the skull base to thigh after the radiotracer. CT
data was obtained and used for attenuation correction and anatomic
localization.
Fasting blood glucose: 155 mg/dl

[mip · 6 of 48 frames shown]
[frame 1/48]
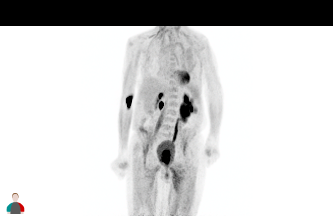
[frame 11/48]
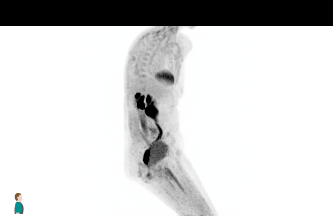
[frame 21/48]
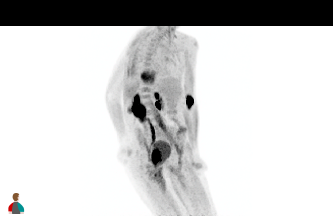
[frame 27/48]
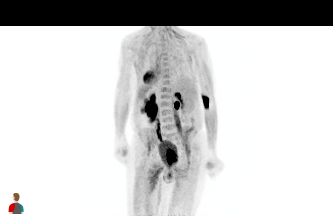
[frame 37/48]
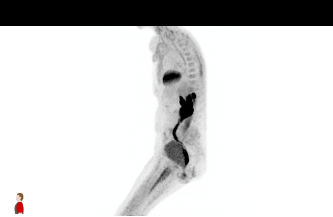
[frame 48/48]
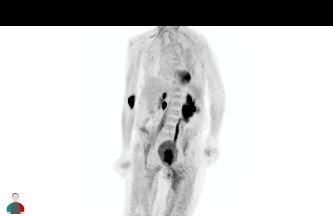

[axial ct wb fusion · 6 of 169 frames shown]
[frame 1/169]
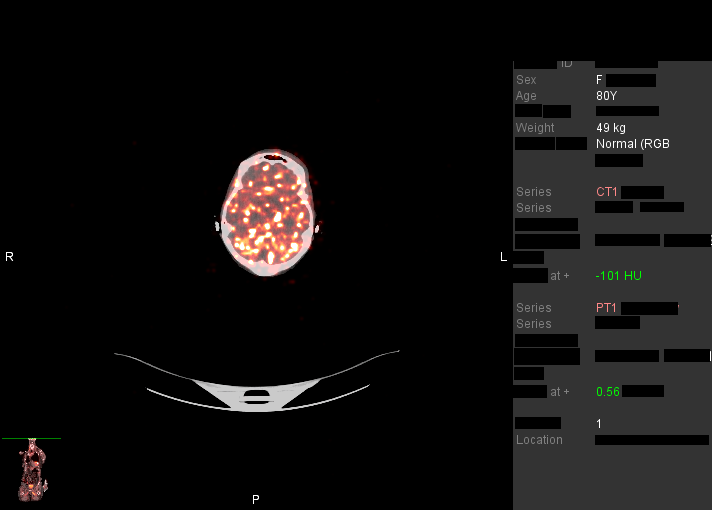
[frame 38/169]
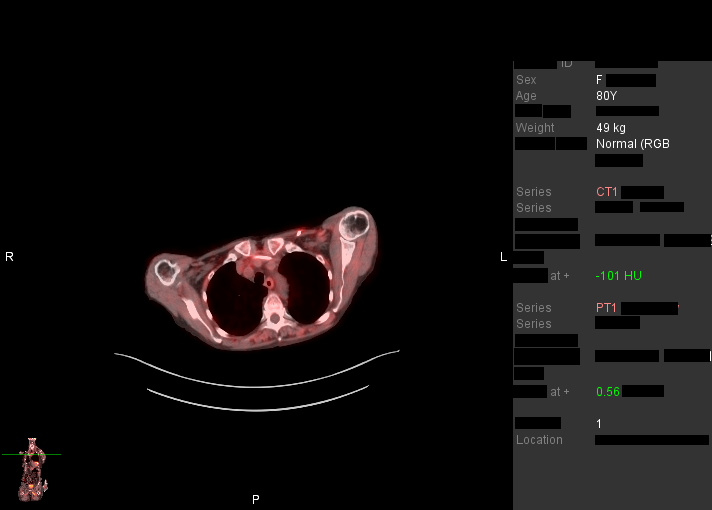
[frame 75/169]
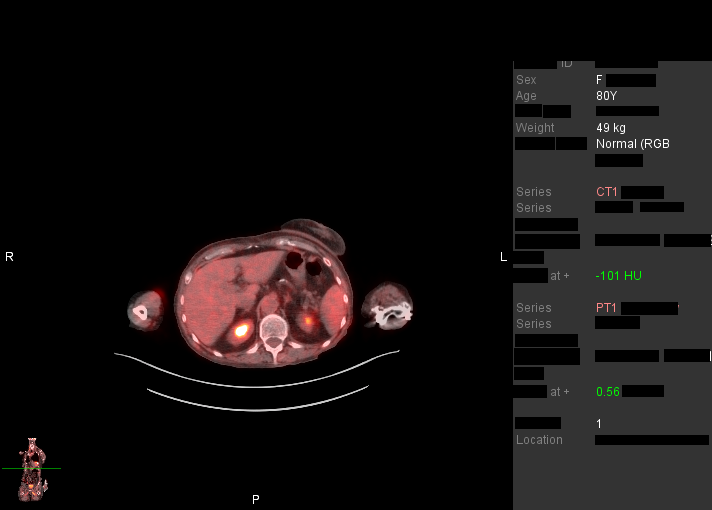
[frame 94/169]
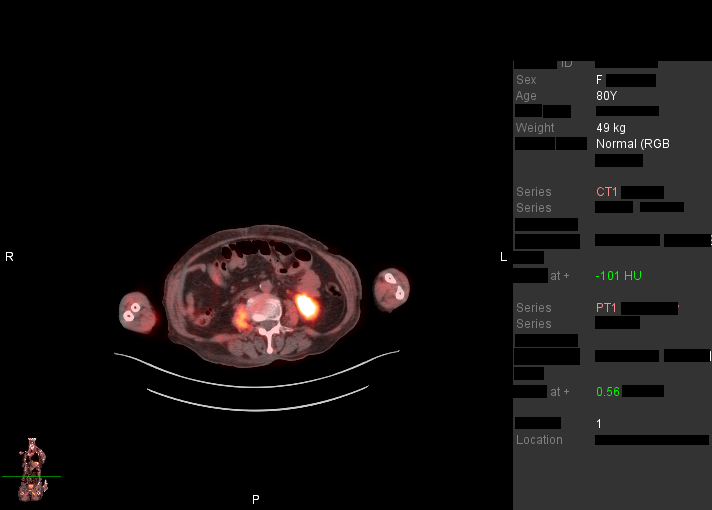
[frame 131/169]
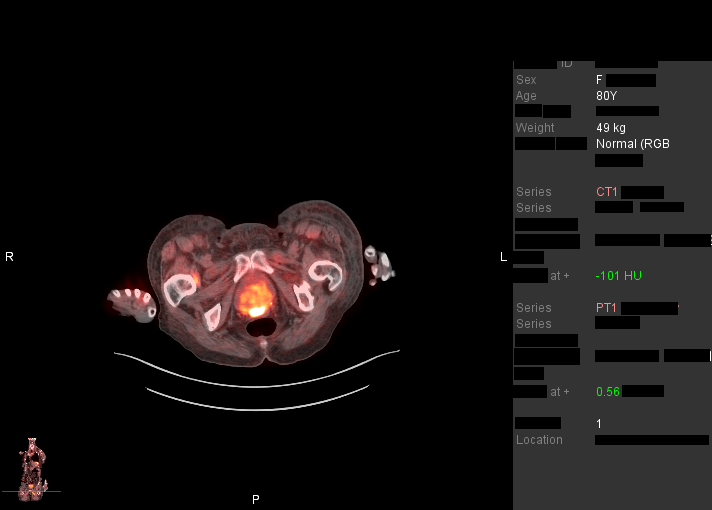
[frame 169/169]
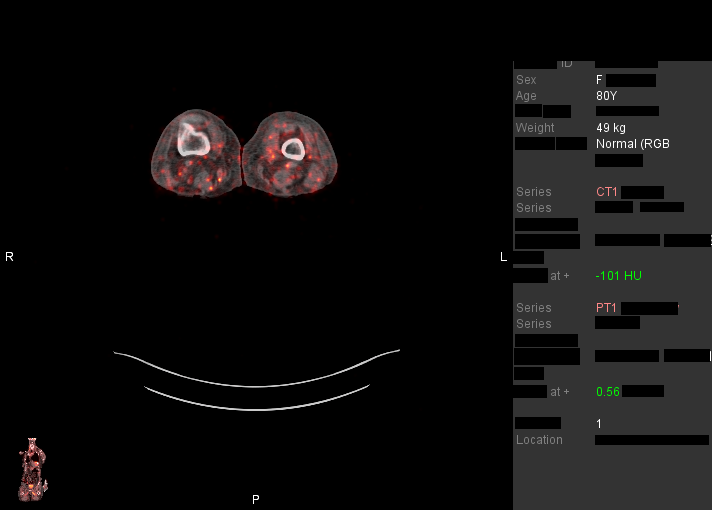

[coronal ct wb fusion · 6 of 10 frames shown]
[frame 1/10]
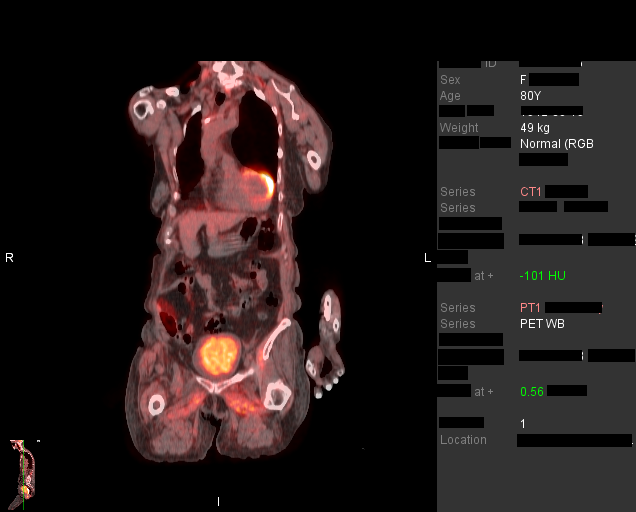
[frame 3/10]
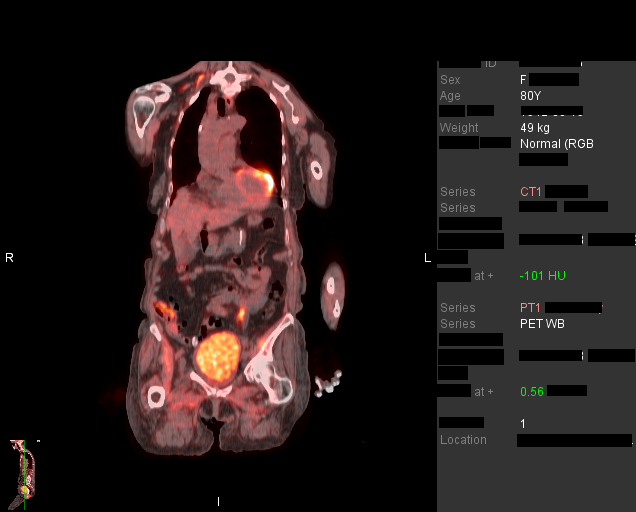
[frame 5/10]
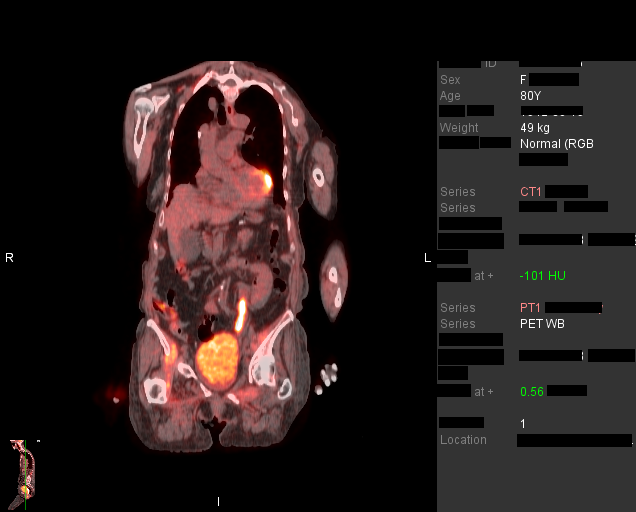
[frame 6/10]
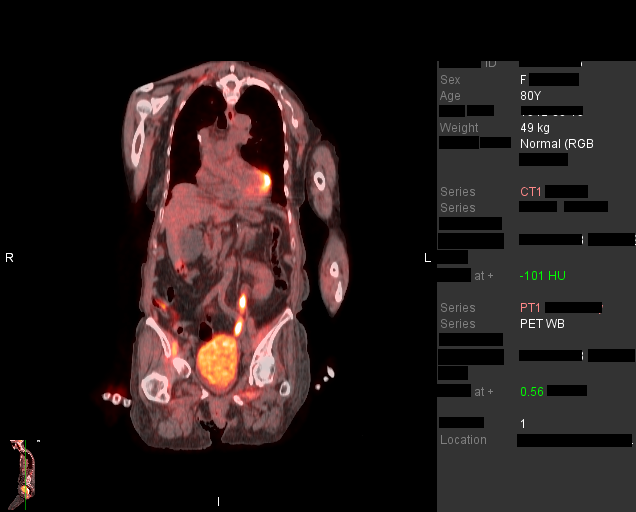
[frame 8/10]
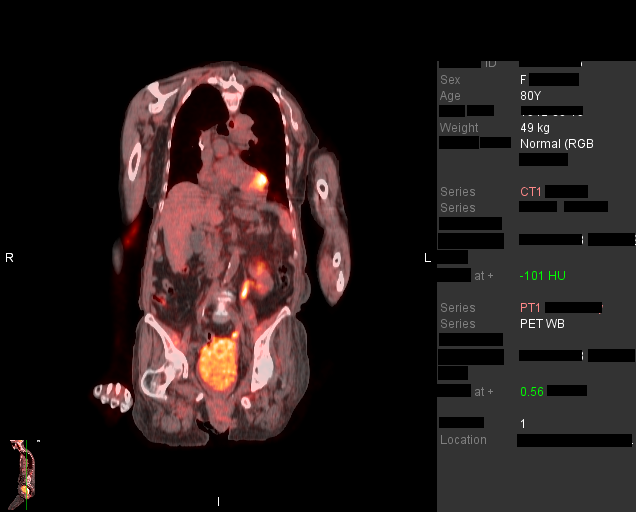
[frame 10/10]
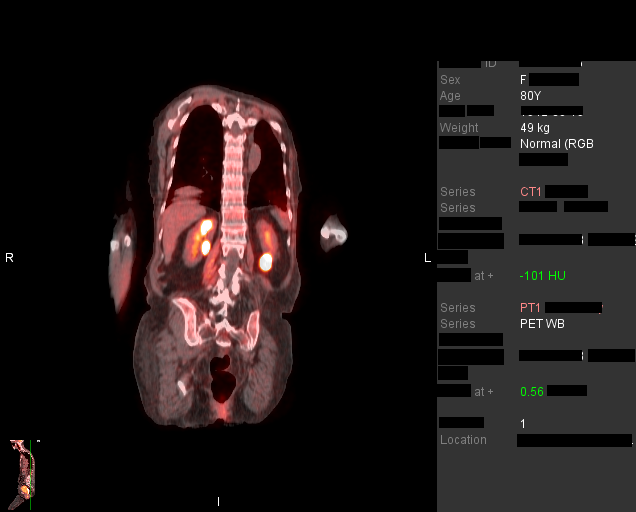

[18 of 30 positions shown; findings below may reference images not displayed]

FINDINGS: Mediastinal blood pool activity: SUV max

Liver activity: SUV max

NECK: No areas of abnormal hypermetabolism. Hypermetabolic cervical
nodes have resolved.

Incidental CT findings: No cervical adenopathy. Bilateral carotid
atherosclerosis.

CHEST: No residual thoracic nodal hypermetabolism.

Incidental CT findings: No residual thoracic adenopathy. New trace,
left larger than right pleural effusions. Left Port-A-Cath tip low
SVC. Mild cardiomegaly. Aortic atherosclerosis.

ABDOMEN/PELVIS: No splenic hypermetabolism. No residual
abdominopelvic hypermetabolism. Index right inguinal node measures
1.3 cm and a S.U.V. max of 1.4 on 258/3. Compare 1.9 cm and a S.U.V.
max of 18.5 on the prior.

Right external iliac node measures 1.1 cm and a S.U.V. max of 1.6 on
248/3 versus 1.6 cm and a S.U.V. max of 12.4 on the prior.

Extensive muscular activity throughout the lower abdomen and pelvis,
presumably secondary to motion after radiopharmaceutical injection.
This is most significant in the right psoas, at a S.U.V. max of 6.2.
No correlate mass or fluid collection identified.

Incidental CT findings: Segment 2 and segment 3 hepatic cysts. Mild
renal cortical thinning bilaterally. Left extrarenal pelvis and
moderate left hydroureter are similar and presumably secondary to
pelvic floor laxity and cystocele. Normal adrenal glands.

SKELETON: Hypermetabolism about the posterior left chest wall
including at the level of the ninth rib measures a S.U.V. max of
3.3, without correlate displaced rib fracture. No suspicious marrow
hypermetabolism. Presumed tracer extravasation within the right
antecubital fossa.

Incidental CT findings: Remote left rib fractures.
IMPRESSION: 1. Complete metabolic response to therapy as detailed above. No
residual nodal hypermetabolism and no new sites of disease. Residual
but significantly improved pelvic adenopathy. No adenopathy within
the chest or abdomen. ([HOSPITAL]) 1.
2. Tiny bilateral  pleural effusions.
3. Posterior left chest wall hypermetabolism without correlate rib
fracture. This is presumably related to recent trauma in this
patient with a reported history of multiple falls.
4. Similar left-sided hydroureter, likely secondary to pelvic floor
laxity and bladder prolapse.
